# Patient Record
Sex: Female | Born: 1935 | Race: Black or African American | Hispanic: No | Smoking: Never smoker
Health system: Southern US, Community
[De-identification: ages and names within clinical notes are randomized; demographics above are authoritative.]

## PROBLEM LIST (undated history)

## (undated) DIAGNOSIS — M1990 Unspecified osteoarthritis, unspecified site: Secondary | ICD-10-CM

## (undated) DIAGNOSIS — I5022 Chronic systolic (congestive) heart failure: Secondary | ICD-10-CM

## (undated) DIAGNOSIS — I350 Nonrheumatic aortic (valve) stenosis: Secondary | ICD-10-CM

## (undated) DIAGNOSIS — E119 Type 2 diabetes mellitus without complications: Secondary | ICD-10-CM

## (undated) DIAGNOSIS — I482 Chronic atrial fibrillation, unspecified: Secondary | ICD-10-CM

## (undated) DIAGNOSIS — R32 Unspecified urinary incontinence: Secondary | ICD-10-CM

## (undated) DIAGNOSIS — E049 Nontoxic goiter, unspecified: Secondary | ICD-10-CM

## (undated) DIAGNOSIS — I10 Essential (primary) hypertension: Secondary | ICD-10-CM

## (undated) DIAGNOSIS — E785 Hyperlipidemia, unspecified: Secondary | ICD-10-CM

## (undated) HISTORY — PX: ABDOMINAL HYSTERECTOMY: SHX81

## (undated) HISTORY — PX: PACEMAKER INSERTION: SHX728

## (undated) HISTORY — PX: CARDIAC CATHETERIZATION: SHX172

## (undated) HISTORY — DX: Unspecified osteoarthritis, unspecified site: M19.90

## (undated) HISTORY — DX: Nontoxic goiter, unspecified: E04.9

## (undated) HISTORY — DX: Hyperlipidemia, unspecified: E78.5

---

## 2003-03-10 ENCOUNTER — Emergency Department (HOSPITAL_COMMUNITY): Admission: EM | Admit: 2003-03-10 | Discharge: 2003-03-11 | Payer: Self-pay | Admitting: Emergency Medicine

## 2005-06-18 ENCOUNTER — Inpatient Hospital Stay (HOSPITAL_COMMUNITY): Admission: EM | Admit: 2005-06-18 | Discharge: 2005-06-27 | Payer: Self-pay | Admitting: Emergency Medicine

## 2005-06-18 ENCOUNTER — Encounter (INDEPENDENT_AMBULATORY_CARE_PROVIDER_SITE_OTHER): Payer: Self-pay | Admitting: *Deleted

## 2005-07-13 ENCOUNTER — Encounter: Admission: RE | Admit: 2005-07-13 | Discharge: 2005-07-13 | Payer: Self-pay | Admitting: Family Medicine

## 2005-07-18 ENCOUNTER — Inpatient Hospital Stay (HOSPITAL_COMMUNITY): Admission: EM | Admit: 2005-07-18 | Discharge: 2005-08-05 | Payer: Self-pay | Admitting: Emergency Medicine

## 2005-07-18 ENCOUNTER — Ambulatory Visit: Payer: Self-pay | Admitting: Cardiology

## 2005-07-19 ENCOUNTER — Encounter: Payer: Self-pay | Admitting: Cardiology

## 2005-07-20 ENCOUNTER — Encounter (INDEPENDENT_AMBULATORY_CARE_PROVIDER_SITE_OTHER): Payer: Self-pay | Admitting: Specialist

## 2005-08-15 ENCOUNTER — Encounter: Admission: RE | Admit: 2005-08-15 | Discharge: 2005-08-15 | Payer: Self-pay | Admitting: Family Medicine

## 2005-09-05 ENCOUNTER — Encounter: Admission: RE | Admit: 2005-09-05 | Discharge: 2005-09-05 | Payer: Self-pay | Admitting: Family Medicine

## 2005-09-10 ENCOUNTER — Inpatient Hospital Stay (HOSPITAL_COMMUNITY): Admission: EM | Admit: 2005-09-10 | Discharge: 2005-09-13 | Payer: Self-pay | Admitting: Emergency Medicine

## 2005-10-07 ENCOUNTER — Encounter: Admission: RE | Admit: 2005-10-07 | Discharge: 2005-10-07 | Payer: Self-pay | Admitting: Family Medicine

## 2008-12-07 ENCOUNTER — Emergency Department (HOSPITAL_COMMUNITY): Admission: EM | Admit: 2008-12-07 | Discharge: 2008-12-07 | Payer: Self-pay | Admitting: Emergency Medicine

## 2010-10-26 LAB — PROTIME-INR
INR: 2 — ABNORMAL HIGH (ref 0.00–1.49)
Prothrombin Time: 24.2 seconds — ABNORMAL HIGH (ref 11.6–15.2)

## 2010-10-26 LAB — DIFFERENTIAL
Eosinophils Absolute: 0.1 10*3/uL (ref 0.0–0.7)
Eosinophils Relative: 3 % (ref 0–5)
Lymphocytes Relative: 30 % (ref 12–46)
Lymphs Abs: 1.6 10*3/uL (ref 0.7–4.0)
Monocytes Relative: 8 % (ref 3–12)

## 2010-10-26 LAB — CBC
MCHC: 31.9 g/dL (ref 30.0–36.0)
Platelets: 188 10*3/uL (ref 150–400)
WBC: 5.4 10*3/uL (ref 4.0–10.5)

## 2010-12-03 NOTE — Discharge Summary (Signed)
NAMENEAVE, LENGER               ACCOUNT NO.:  [id number]   MEDICAL RECORD NO.:  [id number]          PATIENT TYPE:  INP   LOCATION:  1402                         FACILITY:  [facility]   PHYSICIAN:  Danae Chen, M.D.DATE OF BIRTH:  17-Jul-1927   DATE OF ADMISSION:  07/18/2005  DATE OF DISCHARGE:  08/05/2005                                 DISCHARGE SUMMARY   Please refer to the interim discharge summary per Dr. Arletha Grippe, which covers  from January 1 to July 31, 2005, job #[id number].   ADDENDUM DISCHARGE DIAGNOSES:  1.  Congestive heart failure.  2.  Anemia of chronic disease.  3.  Type 2 diabetes.  4.  Thyrotoxicosis.   DISCHARGE MEDICATIONS:  1.  Cardizem 180 mg p.o. b.i.d.  2.  Nu-Iron tablets 150 mg p.o. daily.  3.  Lasix 40 mg p.o. b.i.d.  4.  Tapazole 20 mg p.o. b.i.d.  5.  Aspirin 81 mg p.o. daily.  6.  Glipizide 2.5 mg p.o. daily.  7.  Enalapril 10 mg p.o. daily.  8.  Crestor 10 mg p.o. daily.  9.  Lopressor 100 mg p.o. b.i.d.  10. K-Dur 20 mg p.o. daily.  11. Coumadin 2.5 mg p.o. daily.  12. Glucophage 1000 mg p.o. b.i.d. with meals.   Patient is to follow up with Dr. Dagoberto Ligas, endocrinologist, for re-evaluation  of her thyroid function in 1-2 weeks' time following discharge.  She is to  follow up with Dr. Parke Simmers for her medical review and following of her INR for  anticoagulation therapy with Coumadin in the next week.   PERTINENT DISCHARGE LABS:  INR 2.9, serum creatinine 0.8.   BRIEF HOSPITAL COURSE:  Since January 15, the patient was admitted with  recurrent pleural effusion.  This was tapped and was found to be exudative;  however, there was no evidence of exudative process, and although it was  __________ effusion, it was most consistent with presentation of CHF and has  responded to diuretics.  In regards to her atrial fibrillation, the patient  was on digoxin, beta blocker, verapamil.  The verapamil and digoxin were  discontinued.  The patient has  continued on Lopressor and Cardizem and has  done well with that.  She did have some issues with heart rate increasing  with activity, but her Cardizem was titrated, and she is tolerating this  well with heart rate in the 80s with the current medication dose.  Her  Tapazole was continued throughout her hospital stay.  As noted, she will  need a repeat thyroid scan as an outpatient in 1-2 weeks' time.  Patient  does have home health nursing that has already been previously ordered, and  this will continue.   PHYSICAL EXAMINATION:  At the time of discharge, the patient was afebrile.  Vital signs were stable.  Pulse was 88.  Blood pressure was 128/65 with O2  sat of 99% on room air.  HEART:  Rate was regular.  LUNGS:  Clear.  ABDOMEN:  Soft.  She had no peripheral edema.  NEUROLOGIC:  She was alert and oriented, eating dinner.  Patient will be returning home, where her family has agreed to provide 24-  hour care.  They have refused the offer of skilled nursing facility  placement for the patient.   Other pertinent labs, a BNP of 789.  This was decreased from the initial BNP  of 1680, so it is trending downward.  Calcium 10.6, potassium 5, sodium 137,  BUN 16, creatinine 1.  Hemoglobin 10.3.   For a list of complete procedures, please refer to Dr. Rickard Rhymes discharge  summary.   CONDITION ON DISCHARGE:  Improved.   CONCLUSION:  Patient is to follow up with Dr. Parke Simmers in one week to have a  PT/INR checked.  Dr. Dagoberto Ligas in 1-2 weeks to have thyroid scan done.   New medications include the Cardizem and the Nu-Iron capsules and the other  medications as listed above.   The patient is to have a heart healthy ADA diet.   ACTIVITY:  As tolerated.      Danae Chen, M.D.  Electronically Signed     RLK/MEDQ  D:  08/05/2005  T:  08/05/2005  Job:  [id number]   cc:   Alfonse Alpers. Dagoberto Ligas, M.D.  Fax: [phone]   Renaye Rakers, M.D.  Fax: [phone]

## 2010-12-03 NOTE — Discharge Summary (Signed)
NAMECECILY, [name]               ACCOUNT NO.:  [id number]   MEDICAL RECORD NO.:  [id number]           PATIENT TYPE:   LOCATION:                                 FACILITY:   PHYSICIAN:  [name], D.O.         DATE OF BIRTH:   DATE OF ADMISSION:  09/10/2005  DATE OF DISCHARGE:  09/13/2005                                 DISCHARGE SUMMARY   PRIMARY CARE PHYSICIAN:  [name], M.D.   ENDOCRINOLOGIST:  [name], M.D.   REASON FOR ADMISSION:  Weakness and near syncope.   PRINCIPAL DIAGNOSES:  1.  Symptomatic bradycardia secondary to beta blocker plus calcium channel      blocker with underlying thyrotoxicosis under treatment by Dr. Margaretmary Edwards.  2.  Congestive heart failure secondary to the above in addition to probable      diastolic heart dysfunction with a normal ejection fraction on most      recent 2-D echocardiogram.  3.  Paroxysmal atrial fibrillation, sinus, during this hospital course.  4.  Known pleural effusion secondary to heart failure in the setting of      thyrotoxicosis and initial refractory hypertension.   ADDITIONAL DIAGNOSES:  1.  Anemia, suspected to be related to chronic disease.  2.  Previous history of shingles, resolved.   MEDICATIONS ON DISCHARGE:  1.  She should stop her verapamil ER.  2.  She should continue her Lopressor 50 mg b.i.d.  3.  Lasix 40 mg daily.  4.  Tapazole 20 mg b.i.d. as before.  5.  She is instructed to stop her HCTZ  6.  Continue her metformin 1 gm as before.  7.  Crestor 10 mg as before.  8.  Aspirin 81 mg as before.  9.  Potassium chloride 20 mEq daily as before.  10. Glipizide 2.5 mg daily as before, without change.   DISPOSITION:  [name] is medically stable for discharge home.  She did  present with an elevated BNP of 1008 and an effusion, though has clinically  responded to withholding her verapamil with her heart rate has been in the  50s to 60s range.  She has been hemodynamically stable and  asymptomatic.  She should followup with Dr. Margaretmary Edwards and call for an appointment  within 1-2 weeks.  I did call to request these laboratory studies to be  faxed; however, I have not yet received these and have recommended to Ms.  Edwards and her daughter to followup with Dr. Margaretmary Edwards on these issues.   LABORATORY DATA:  On discharge the patient's BNP had decreased to 193.  Sodium 137, potassium 3.9, BUN 23, creatinine 1.3, glucose 91.   HISTORY OF PRESENTING ILLNESS:  For full details please refer to the H&P as  dictated by Dr. Lonia Edwards; however, briefly, [name] was a 77-  year-old female with complex medical history who had been doing quite well  since her recent hospital discharge.  She was putting some clothes in her  washing machine without complaints, in the morning;  but throughout the day  she began to feel progressively weak and lightheaded.  She found a chair and  had to sit down and remained symptomatic for about 30 minutes.  This did  slowly resolve.  She did become eventually stronger.  There was no chest  pain or diaphoresis; and no seizure-type activity; and she was subsequently  presented to the emergency department and discovered to be profoundly  bradycardic with a heart rate in the 30s.  Her EKG was unremarkable in  comparison to previous.  She was admitted for further evaluation and  management.   [name] suffered a symptomatic episode of bradycardia secondary to her  medications including verapamil and beta blockers.  She had refractory,  rapid ventricular rate on previous admission.  This is felt to be in the  context of thyrotoxicosis.  She has been on Tapazole for quite some time and  it appears that this is resolving to a degree and she may need to continue  down titration of her rate controlling antihypertensive medications.  In the  hospital we have held her verapamil and she is doing quite well. It may well  be appropriate to start  decreasing her Lopressor if this does continue to  trend downwards.   It may be reasonable to assess her Edwards pressure, once again, this week to  see if she can at least wean it to at least half her dose of Lopressor.  At  this time we are holding her hydrochlorothiazide as well as her verapamil  and requesting that she followup with Dr. Margaretmary Edwards, this week as well  as Dr. Parke Edwards within the next couple of weeks.      [name], D.O.  Electronically Signed     ESS/MEDQ  D:  09/13/2005  T:  09/13/2005  Job:  [id number]   cc:   [name], M.D.  Fax: [phone]   [name], M.D.  Fax: [phone]

## 2010-12-03 NOTE — Discharge Summary (Signed)
[name], [name]               ACCOUNT NO.:  [id number]   MEDICAL RECORD NO.:  [id number]          PATIENT TYPE:  INP   LOCATION:  1402                         FACILITY:  [facility]   PHYSICIAN:  Nelma Rothman, MD   DATE OF BIRTH:  Dec 29, 1926   DATE OF ADMISSION:  07/18/2005  DATE OF DISCHARGE:                                 DISCHARGE SUMMARY   PRIMARY CARE PHYSICIAN:  Dr. Renaye Rakers   CURRENT DIAGNOSES:  1.  Recurrent right pleural effusion, exudative.  2.  Atrial fibrillation.  3.  Hyperthyroidism.  4.  Diabetes mellitus type 2.  5.  Anemia, iron deficiency versus anemia of chronic disease.  6.  Hypercalcemia, with mild increase in alkaline phosphatase, possibly      secondary to increase in bone turnover secondary to hyperthyroidism.   PROCEDURES:  1.  A 2-D echocardiogram on July 19, 2005, demonstrated overall normal      left ventricular systolic function with ejection fraction of 55-65%.      There were no region wall motion abnormalities. There was very mild      aortic valve stenosis with a mean gradient of 9.9 mmHg and estimated      aortic valve area was 1.44 sqcm. There was very mild mitral      regurgitation. The left atrium was mildly to moderately dilated. The      right ventricle was mildly to moderately dilated with mildly increased      systolic pressures and a moderately dilated right atrium.  2.  Two-views of the chest on January 1 demonstrated little interval change      in the past few days in the moderately large right pleural effusion.  3.  Ultrasound-guided thoracentesis on January 3 drained approximately 940      mL of serosanguineous fluid.  4.  Follow-up view of the chest demonstrated slight decrease in the size of      the pleural effusion and no pneumothorax.  5.  Two-views of the chest on January 6 demonstrated a persistent right-      sided effusion.  6.  CT angiogram of the chest on January 8 was negative for pulmonary      embolus. There  was mild dilatation of the descending thoracic aorta and      a thyroid goiter. The right pleural effusion filled approximately half      of the right hemithorax.  7.  Repeat ultrasound-guided thoracentesis on January 8 demonstrated an      additional 1000 mL of fluid.  8.  Most recent portable chest x-ray on January 12 demonstrated mild      cardiomegaly as well as some interstitial edema and reaccumulation of      right-sided effusion.   LABORATORY DATA:  On the morning of dictation, INR was 2.5. Her creatinine  was 0.8 with a potassium of 4.1 and a BUN of 17, calcium was 10.4 with most  recent albumin of 2.4. Beta natriuretic peptide on admission was 1680 and  most recently is 938. Most recent TSH was 0.010. Digoxin level on the  morning of January 14 was 0.8. Pleural fluid analysis demonstrated cloudy  fluid with white blood cell count 700 with 16% neutrophils, 10% lymphocytes,  and 59% monocytes and macrophages. There were 15% mesothelial cells. Pleural  fluid glucose was 141 with protein 3.5 which is likely exudative in nature.  Pleural fluid culture and gram stain are negative. Blood cultures are  negative and urine culture also negative.   HISTORY AND PHYSICAL:  Please see dictated admission history and physical  but briefly, Ms. Bouie is a 77 year old female who presented with shortness  of breath. She was recently diagnosed with thyrotoxicosis and atrial  fibrillation as well as a right-sided effusion, which had recurred. She was  admitted for further evaluation and management.   HOSPITAL COURSE AS ARRANGED BY SYSTEMS:  #1 - RECURRENT RIGHT-SIDED PLEURAL  EFFUSION. Based on analysis of the fluid, this is likely exudative in  nature. The etiology is not entirely clear. The first time she had a  thoracentesis done it was lymphocyte predominant, but this does not appear  to be the case anymore. Last admission, the effusion was drained completely  and a repeat CT scan of the  chest at that time demonstrated complete  reexpansion of that lung, arguing against an obstructing bronchial lesion as  the etiology. Cytology has been negative, reactive per pathology report.  Culture and gram stain are also negative. I thought this may possibly be  related to component of heart failure given her markedly elevated BNP in the  setting of hyperthyroidism and atrial fibrillation. Nevertheless, the right-  sided pleural effusion persists despite diuresis. Although her oxygen levels  on room air are near 100% saturation, she continues to complain of dyspnea  such that a pulmonary consult may be warranted with regard to the right  effusion. Finally, with regard to her pleural effusion, the first time that  it was tapped it was rather bloody in nature and, again, serosanguineous the  second time. Given the exudative effusion and continued dyspnea, I felt  obliged to rule out pulmonary embolus as an etiology. Therefore, she  underwent a CT angiogram of the chest on January 8 which was negative for  pulmonary embolus.   #2 - ATRIAL FIBRILLATION. This was just recently diagnosed during last  hospitalization. It is unclear if this is chronic atrial fibrillation or if  this is related to her hyperthyroidism. Nevertheless, she is anticoagulated  on Coumadin. Her rate control has been somewhat difficult, as I suspect her  tachycardia is being driven by her thyrotoxicosis. She had been taking  verapamil and metoprolol at home, and was actually well rate-controlled on  admission. However, with minimal activity her heart rate would increase to  the 130s and 140s, at which point she would feel dyspneic. Although she has  a normal ejection fraction, with her elevated BNP, digoxin was added to her  regimen. She had a fair amount of bradycardia with a few episodes of pauses  greater than 3 seconds while being loaded with digoxin, so further IV doses were stopped and she only received 250 mcg  IV loading dose. Thereafter, she  was started on 125 mcg daily and has been on this for approximately 2 days.  Her digoxin level this morning was 0.8, although I suspect this is too high  given her age and still with occasional bradycardia on telemetry. I have  discontinued her metoprolol a couple of days ago. At this point, she is well  rate-controlled with a heart rate  in the 70s and I have changed her digoxin  to every-other day dosing. She continues on verapamil. This will need to be  followed closely after discharge, as I suspect that once her hyperthyroidism  is treated adequately her atrial fibrillation will be much easier to  control.   #3 - HYPERTHYROIDISM. She is currently on Tapazole. She was evaluated by Dr.  Dagoberto Ligas on admission. A thyroid scan was ordered, but unfortunately was still  compromised based on recent IV contrast from CT. She is continued on  Tapazole and should follow up with Dr. Dagoberto Ligas 3-4 weeks after admission. As  of time of dictation, her last dose of IV contrast was on January 8.   #4 - DIABETES MELLITUS TYPE 2. This is very well controlled on metformin and  sliding scale insulin.   Please note this is an interim discharge summary and final discharge summary  will be dictated by a physician at that time.      Nelma Rothman, MD  Electronically Signed     RAR/MEDQ  D:  07/31/2005  T:  08/01/2005  Job:  [id number]   cc:   Renaye Rakers, M.D.  Fax: [phone]

## 2010-12-03 NOTE — Discharge Summary (Signed)
[name], [name]               ACCOUNT NO.:  [id number]   MEDICAL RECORD NO.:  [id number]          PATIENT TYPE:  INP   LOCATION:  1417                         FACILITY:  [facility]   PHYSICIAN:  Isidor Holts, M.D.  DATE OF BIRTH:  Jun 22, 1927   DATE OF ADMISSION:  06/17/2005  DATE OF DISCHARGE:  06/27/2005                                 DISCHARGE SUMMARY   PRIMARY CARE PHYSICIAN:  Renaye Rakers, M.D.   DISCHARGE DIAGNOSES:  Refer to Interim Discharge Summary dictated June 22, 2005, by Dr. Threasa Beards.  In addition, add:  Mild congestive heart failure.   PROCEDURES:  Refer to Interim Discharge Summary dictated June 22, 2005,  by Dr. Threasa Beards.  In addition:  1.  Chest x-ray dated June 24, 2005.  This showed slight increase, since      prior plain film of right-sided pleural effusion with developing right      base atelectasis.  There is also cardiomegaly and increased pulmonary      venous congestion, trace left-sided pleural fluid is stable.   For admitting history and detailed clinical course, refer to above-mentioned  Interim Discharge Summary.  However, as of the period of June 23, 2005,  to June 26, 2005, the patient's clinical condition remained relatively  stable. Blood pressure was uncontrolled on June 23, 2005, at 169/85, the  patient continued to experience episodes of tachycardia; i.e., rapid atrial  fibrillation with rates somewhere in the range of 110 to 130.  She also was  found to have mild bilateral lower extremity edema.  It was felt that all  these clinical findings were in the most part, referable to patient's  toxicosis, thus Beta blocker treatment was indicated.  Lopressor was  therefore, instituted in a dose of 25 mg twice daily, which was subsequently  titrated up to 50 mg p.o. twice daily which the patient tolerated well with  rate control of atrial fibrillation and markedly improved blood pressure  control.  As the patient was  felt to be in mild congestive heart failure  secondary to above, Lasix dose was increased to 40 mg p.o. daily with good  clinical response.  However, the patient developed mild hypokalemia  necessitating addition of  potassium supplements.   Diabetes mellitus remained controlled.  Oral hypoglycemics have been  reinstitiuted, although metformin has been discontinued indefinitely, given  patient's age and also presence of mild CHF because of increased propensity  to lactic acidosis.  She is currently on glipizide 2.5 mg p.o. daily and has  remained glycemic on this.  Carbohydrate-modified diet was, of course,  recommended.   The patient's hemoglobin level has remained stable.  As a matter of fact, on  June 26, 2005, hemoglobin was 10.2 with hematocrit of 30.8.  As patient  remains in atrial fibrillation, anticoagulation is continued, and we expect  this to be followed up on outpatient basis by patient's primary care  physician, Dr. Renaye Rakers, who will check her INRs periodically and adjust  Coumadin dosage as indicated.  The patient also will need endocrinology  consultation, which we expect to  be arranged by her primary care physician,  with a view to definitive management of thyrotoxicosis. It is possible that  she may opt for radioiodine therapy.  However, we defer this to  endocrinologist.   Note that patient was recently bereaved; i.e., lost her son-in-law; however,  her mood appears quite stable.  The news was broken to her by her family,  and she accepted it calmly. The patient obviously has close family support.   DISPOSITION:  The patient was considered clinically stable for discharge on  June 27, 2005.   DIET:  Carbohydrate modified.   ACTIVITY:  As tolerated.   WOUND CARE:  Not applicable.   PAIN MANAGEMENT:  Not applicable.   FOLLOW UP:  The patient is to follow up with her primary care physician, Dr.  Renaye Rakers, on June 30, 2005, to check her INR and  have her Coumadin  dosage adjusted as indicated.  Telephone number is [phone].  She has also  been instructed to follow up routinely in one week.   SPECIAL INSTRUCTIONS:  The patient's primary physician is expected to check  patient's blood count next week, as patient is on Tapazole.  The patient has  been instructed that if she should develop a sore throat or develop cold or  flu-like symptoms, she should call her primary care physician urgently, in  order to have her blood count checked.  All this has been communicated to  patient and her family, and they have verbalized understanding.   DISCHARGE MEDICATIONS UPDATED:  1.  Methimazole (Tapazole) 20 mg p.o. twice daily.  2.  Verapamil ER 240 mg p.o. q.a.m. and 120 mg p.o. nightly.  3.  Aspirin 81 mg p.o. daily (over-the-counter)  4.  Glipizide 2.5 mg p.o. daily.  5.  Enalapril 10 mg p.o. daily.  (Was on Enalapril/hydrochlorothiazide.)  6.  Crestor 10 mg p.o. daily.  7.  Lopressor 50 mg p.o. twice daily.  8.  K-Dur 20 mEq p.o. daily.  9.  Lasix 40 mg p.o. daily.  10. Metformin.  This has been stopped.  11. Coumadin according to INR.  The patient is currently on 5 mg p.o. q.p.m.      Further adjustments will be done by primary care physician as indicated.      Isidor Holts, M.D.  Electronically Signed     CO/MEDQ  D:  06/26/2005  T:  06/26/2005  Job:  [id number]   cc:   Renaye Rakers, M.D.  Fax: [phone]   Althia Forts, M.D.  [place], [place]

## 2010-12-03 NOTE — Consult Note (Signed)
NAMEADALIAH, [name]               ACCOUNT NO.:  [id number]   MEDICAL RECORD NO.:  [id number]          PATIENT TYPE:  INP   LOCATION:  1402                         FACILITY:  [facility]   PHYSICIAN:  [name], M.D.DATE OF BIRTH:  1927/01/03   DATE OF CONSULTATION:  DATE OF DISCHARGE:                                   CONSULTATION   HISTORY:  This is a 77 year old woman who was previously in the hospital 2  weeks ago, and at that time she had a diagnosis of hyperthyroidism.  She had  a TSH level of 8 and a free T4 that was elevated at 3.32.  She was treated  with Tapazole therapy and was discharged from the hospital.  At that time  she had symptoms of congestive heart failure and had pleural effusions.  She  was receiving 20 mg of Tapazole b.i.d.  She was also in atrial fibrillation  and was on Coumadin and was discharged from the hospital, resuming her  Tapazole.  She presents now with shortness of breath and is also in atrial  fibrillation and heart failure at this time.   She has a history of a significant weight loss.  She states that she has had  a history of 50-60-pound weight loss during the summer.  She is nonspecific  in her symptoms.   PAST MEDICAL HISTORY:  She was hospitalized in December as noted above.  She  was also hospitalized in the past for a hysterectomy, but it sounds like she  has been in reasonably good health.   She has a history of diabetes mellitus.   MEDICATIONS:  Prior to this admission include:  1.  Coumadin 5 mg daily.  2.  Tapazole 20 mg b.i.d.  3.  Verapamil 240 in the morning and 120 in the evening.  4.  Enteric-coated aspirin 81 mg.  5.  Glipizide 2.5 mg.  6.  Enalapril 10 mg daily.  7.  __________ 10 mg daily.  8.  Lopressor 50 mg b.i.d.  9.  K-Dur 20 mEq daily.  10. Lasix 40 mg daily.   PERSONAL HISTORY:  She does not smoke or drink excessive amounts of alcohol.  She denies any history of allergies.   REVIEW OF SYSTEMS:  Her weight  is decreased as noted above.  She denies any  history of chest pain.  GI:  No complaints.   PHYSICAL EXAMINATION:  GENERAL:  This is a well-developed woman who appears  clinically stable, in no distress.  HEENT:  Her head is normocephalic.  Her eyes show moderate exophthalmos to  be present.  There is no paralysis of gaze.  There is no lid lag.  No  periorbital edema.  NECK:  Supple.  The thyroid is diffusely enlarged, measuring two to three  times normal.  It is a finely nodular texture and firm in texture.  No  dominant nodules are palpable.  LUNGS:  Clear, but tubular breath sounds are present on the left base.  CARDIOVASCULAR:  Rhythm is irregular.  EXTREMITIES:  Show trace pedal edema.  No evidence of pretibial myxedema.  Her hands show no evidence of acropachy.   IMPRESSION:  Hyperthyroidism.   DISCUSSION:  At this time a radioactive iodine uptake and scan is requested.  She would do best with definitive therapy, and radioactive iodine therapy  will be the treatment of choice.  The patient was informed of this, and also  I informed her that this would require her more than likely to receive life-  long thyroid replacement, to which she appears to agree.  The results of the  radioactive iodine uptake will be present in a couple of days, and at that  time therapy will be arranged.  The patient could be discharged subsequent  to that according to her heart failure status.  We will need to keep her off  the Tapazole for a period of time while we do the radioactive iodine  therapy.           ______________________________  Alfonse Alpers. Dagoberto Ligas, M.D.     CGG/MEDQ  D:  07/19/2005  T:  07/19/2005  Job:  [id number]

## 2010-12-03 NOTE — H&P (Signed)
NAMEMEKLIT, COTTA               ACCOUNT NO.:  [id number]   MEDICAL RECORD NO.:  [id number]          PATIENT TYPE:  INP   LOCATION:  1833                         FACILITY:  [facility]   PHYSICIAN:  Lonia Blood, M.D.DATE OF BIRTH:  [birth year]   DATE OF ADMISSION:  09/10/2005  DATE OF DISCHARGE:                                HISTORY & PHYSICAL   PRIMARY CARE PHYSICIAN:  Dr. Parke Simmers.   ENDOCRINOLOGIST:  Dr. Dagoberto Ligas.   CHIEF COMPLAINT:  Weakness with near syncope.   HISTORY OF PRESENT ILLNESS:  [name] is a 77 year old female with a  complex medical history noted below. She was discharged from the hospital  just in mid-January of 2007 after a long stay for acute CHF exacerbation  with a chronic right pleural effusion. She reports that she had been doing  quite well living with her daughter since that time. Today, she was up  putting some clothes in the washing machine. She had no complaints this  morning. As she was up, she began to feel extremely weak. She began to feel  as if she was going to pass out. She found the chair and sat down. Her  symptoms continued for approximately 30 minutes. They then slowly resolved  and she began to feel stronger. There was no chest pain. There was no  diaphoresis. There was no headache. There was no visual change. There was no  focal neurologic deficit or focal weakness. There was no loss of  consciousness. There was no seizure-type activity. There was no loss of  bowel or bladder control. Because of the patient's complex history and the  fact that this was an initial episode for this patient, family decided to  present with her to the emergency room for evaluation.   In the emergency room, the patient was placed on telemetry. Heart rate was  as low as 36 initially and was sustained there. Without any significant  intervention, the patient's heart rate was observe to spontaneously increase  back to 54 to 64. EKG reveals bradycardia but no  evidence of acute ST or T-  wave elevation or change. The patient does have an irregular rhythm  consistent with her known atrial fibrillation.   REVIEW OF SYSTEMS:  As mentioned above, the patient states that she has been  doing quite well since her discharge from the hospital and has not had any  significant complaints until those noted in the history of present illness  above.   PAST MEDICAL HISTORY:  1.  Reported history of CHF with an acute exacerbation and a persistent      right effusion requiring hospitalization January 2007.      1.  CHF was felt to be rate-related in the setting of atrial          fibrillation and hypothyroidism.      2.  Echocardiogram in January of 2007 actually revealed normal LV          systolic function. EF 55-65%. No wall motion abnormalities, normal          left ventricular thickness, mild aortic valve  stenosis in the left          atrium which was moderately dilated.  2.  Persistent right pleural effusion with negative cytology of unclear      etiology.  3.  Anemia, question of chronic disease.  4.  Diabetes mellitus type 2.  5.  Thyroid toxicosis/hypothyroidism with multiple nodular goiter and      Tapazole now for approximately one month.  6.  Paroxysm atrial fibrillation with Coumadin discontinued approximately      one week ago by the patient's primary care physician.  7.  Bradycardia with digoxin therapy during hospitalization January 2007.  8.  Status post hysterectomy.  9.  Recent history of shingles.  10. Hypercholesterolemia.   OUTPATIENT MEDICATIONS:  1.  Lasix 40 milligrams p.o. daily.  2.  Verapamil ER 240 milligrams p.o. daily.  3.  Tapazole 20 milligrams p.o. b.i.d.  4.  Enalapril 10 milligrams p.o. daily.  5.  Lopressor 50 milligrams p.o. b.i.d.  6.  HCTZ 25 milligrams p.o. daily.  7.  Metformin 1000 milligrams p.o. q.a.m.  8.  Crestor 10 milligrams p.o. q.a.m.  9.  Aspirin 81 milligrams daily.  10. Potassium chloride 20 mEq  p.o. daily.  11. Glipizide 2.5 milligrams p.o. daily.   ALLERGIES:  No known drug allergies.   FAMILY HISTORY:  Noncontributory secondary to age.   SOCIAL HISTORY:  The patient lives with her daughter in [place]. She is a  widow. She has six children total. She does not smoke. She does not drink  alcohol. She previously used snuff but quit about one year ago.   LABORATORY DATA:  Urinalysis is negative. INR is 1.3. BNP is 1008. A pH of  7.37, pCO2 of 37, pO2 is not available on the venous gas. Chest x-ray  reveals cardiomegaly, vascular congestion and stable right effusion. Point  of care markers are negative x1. A 12-lead EKG reveals 54 beats per minute  with irregular rhythm but no evidence of acute ST or T-wave change. Sodium  is low at 134, potassium is high at 5.5, bicarbonate is normal. BUN is  elevated 38, creatinine is mildly elevated at 1.5. Serum glucose is 98.   PHYSICAL EXAMINATION:  VITAL SIGNS: Temperature 97.9, blood pressure 133/56,  heart rate 36, respiratory rate 24, O2 saturation 98% on room air.  GENERAL: Thin, frail-appearing female who is not in acute respiratory  distress.  HEENT: Normocephalic and atraumatic. Pupils are equal, round, and reactive  to light and accommodation. Extraocular movements intact bilaterally. OC/OP  clear.  NECK: No JVD. No lymphadenopathy.  LUNGS: Clear to auscultation bilaterally without wheezes or rhonchi.  CARDIOVASCULAR: Bradycardic and irregular with a 2/6 holosystolic murmur  that does radiate up into the neck.  ABDOMEN: Thin, nontender, nondistended, soft, bowel sounds present. No  hepatosplenomegaly, no rebound and no ascites.  EXTREMITIES: No significant clubbing, cyanosis, or edema bilateral lower  extremities.  NEUROLOGICAL: The patient is alert, though somewhat lethargic. Cranial  nerves II through XII are intact bilaterally. There is 5/5 strength in the bilateral upper and lower extremities. He has intact sensation to  touch  throughout. There is no Babinski.   IMPRESSION/PLAN:  1.  Severe symptomatic bradycardia with near syncopal spell: The patient may      in fact be developing a tachybrady syndrome accompanying her atrial      fibrillation. It is noted that she had a significant bradycardic event      with digoxin during her recent hospitalization. The patient  is on two      negative chronotropic agents to include verapamil and Lopressor. I will      hold both these medications at this time. It is also quite possible that      the patient's Tapazole dose is sufficiently high enough to cause severe      thyroid suppression and the bradycardia could be related to this. I will      check a thyroid-stimulating hormones though it may be too early for it      to accurately reflect the action of the Tapazole which was initiated      approximately one month ago. We will follow the patient on telemetry. We      will rule her out for silent myocardial infarction as she would be at      high risk for this with hypercholesterolemia, diabetes, hypertension and      advanced age. If the patient's bradycardia does not spontaneously      resolve with discontinuation of verapamil and Lopressor or if the      patient has reflex tachycardia which is not able to be managed without      bradycardia, we will consult cardiology to consider pacemaker placement      so that negative chronotropic agents may be used to treat the patient's      paroxysm atrial fibrillation.  2.  Diabetes mellitus: We will hold the patient's p.o. medications as I am      not sure that her p.o. intake will continue at her home level for now.      We will use sliding scale insulin and follow her complete blood glucose      closely.  3.  Hypothyroidism on Tapazole: We will check a thyroid-stimulating      hormones. I will hold the patient's Tapazole dose for now. We will      consider resuming Tapazole based upon the results of the thyroid-       stimulating hormones.  4.  Recent hypercalcemia: The patient has a recent history of hypercalcemia.      We will follow up with calcium in the morning and dive into further      workup as appropriate.  5.  Mild hyponatremia: This is most likely secondary to a combination of      Lasix and HCTZ therapy. I will hold both of these medications and very      gently hydrate the patient. It should be noted that she does not have a      history of left ventricular systolic dysfunction and her previous      congestive heart failure was felt to be rate related with uncontrolled      paroxysm atrial fibrillation.  6.  Mild renal insufficiency: I feel that this is most likely secondary to      dual diuretics in an elderly patient. We will hold off on both the Lasix      and the HCTZ for now and follow her creatinine closely.  7.  Elevated B-type natriuretic peptide: The validity of this is     questionable in a patient with a creatinine of 1.5. A creatinine of 1.5      is only borderline high for this frail, thin 77 year old female. This      represents a significant decrease in glomerular filtration rate. For      now, we will hod diuretics as discussed above. I do not feel that  rechecking B-type natriuretic peptide would add anything to the      patient's clinical treatment plan.      Lonia Blood, M.D.  Electronically Signed     JTM/MEDQ  D:  09/10/2005  T:  09/10/2005  Job:  [id number]   cc:   Renaye Rakers, M.D.  Fax: [phone]   Alfonse Alpers. Dagoberto Ligas, M.D.  Fax: [phone]

## 2010-12-03 NOTE — H&P (Signed)
NAMEMILANA, Edwards               ACCOUNT NO.:  [id number]   MEDICAL RECORD NO.:  [id number]          PATIENT TYPE:  INP   LOCATION:  0103                         FACILITY:  [facility]   PHYSICIAN:  Lonia Blood, M.D.DATE OF BIRTH:  [birth year]   DATE OF ADMISSION:  06/17/2005  DATE OF DISCHARGE:                                HISTORY & PHYSICAL   CHIEF COMPLAINT:  Weakness.   HISTORY OF PRESENT ILLNESS:  Ms. [name] is a 77 year old female who  is visiting family.  She lives in [place], [place], but has multiple  family members who live in the [place] area.  Ms. [name] has had multiple  constitutional symptoms dating back many months.  Over the last year, family  reports that she has lost a total of 50 pounds.  In the last 6 months, the  patient has developed a chronic, dry, nonproductive, hacking cough.  She has  also become short of breath on exertion and sometimes when lying flat.  These are new symptoms for her.  Additionally, she developed a case of  shingles on the left face in July.  She has been feeling weak in general.  She reports her appetite is diminishing.  Overall, the family feels that the  patient is failing to thrive in a significant enough way that they are  concerned that something is going on.  Although, no acute change was  encountered tonight the patient had announced to the family that she was  returning to [place] in the morning and the family therefore insisted  that she be evaluated by medical personnel prior to her leaving.  As a  result, the family presented with the patient to [facility] Emergency Room.   REVIEW OF SYSTEMS:  Negative with the exception of the positive elements  noted in history of present illness above.   PAST MEDICAL HISTORY:  1.  Hypercholesterolemia.  2.  Hypertension.  3.  Paroxysmal atrial fibrillation on Verapamil and aspirin.  4.  Diabetes mellitus, discontinued medication 1 week ago.  5.  Status  post hysterectomy.   MEDICATIONS:  1.  Verapamil ER 240 mg p.o. daily.  2.  Enalapril 10/HCTZ 25 mg p.o. daily.  3.  Metformin 1000 mg q.a.m., discontinued approximately 1 week ago.  4.  Crestor 10 mg p.o. q.a.m.  5.  Enteric coated aspirin 81 mg p.o. daily.   ALLERGIES:  No known drug allergies.   FAMILY HISTORY:  Noncontributory secondary to age.   SOCIAL HISTORY:  The patient does not smoke.  She does not drink.  She lives  in [place] in the [place] area.  She is retired and lives alone in  [place], but has multiple family members in the [place] area.   LABORATORY DATA AND X-RAY FINDINGS:  Hemoglobin is low at 8.9 with a normal  MCV of 85, white count and platelet count normal.  Sodium low at 130,  potassium 3.4 which is low, chloride and bicarb are normal, BUN 12,  creatinine 0.7, serum glucose 133, calcium 10.1.  LFTs normal.  Albumin low  at 3.0.  CT scan of the chest reveals enlarged right supraclavicular lymph node with  enlarged thyroid gland with multiple nodules and a very large right-sided  effusion.   PHYSICAL EXAMINATION:  VITAL SIGNS:  Temperature 98.8, blood pressure  146/73, heart rate 94, respirations 18, O2 saturations 96% on room air.  CBG  156-174 in the ER.  GENERAL:  Cachectic-appearing, frail female who appears older than her  stated age, but is in no acute respiratory distress.  HEENT:  Normocephalic, atraumatic.  Pupils equal round and reactive to light  and accommodation, but sluggish.  OC/OP clear.  NECK:  Thin, no JVD, no appreciable lymphadenopathy.  I am unable to palpate  any thyroid nodules, but thyroid exam is difficult due to difficulty with  swallowing during palpation.  LUNGS:  Clear to auscultation throughout with good air movement throughout  all fields with exception to the right base where lung sounds are completely  absent.  CARDIAC:  Tachycardic at approximately 105 beats per minute, irregularly  irregular without  appreciable murmur, gallop or rub.  ABDOMEN:  Thin, mildly tender to deep palpation throughout.  Bowel sounds  positive.  No mass, no hepatosplenomegaly, no rebound, no ascites.  EXTREMITIES:  Thin, wasted appearing, no clubbing, cyanosis or edema of  bilateral lower extremities.  NEUROLOGIC:  She has 5/5 strength in bilateral upper and lower extremities.  Intact sensation to touch throughout.  No Babinski.  Cranial nerves 2-12  grossly intact bilaterally.  Alert and oriented x4.   ASSESSMENT/PLAN:  1.  Combination of weight loss, generalized weakness, chronic cough, large      right pleural effusion and thyroid nodules is all very concerning.  It      is not clear at this time if the thyroid nodules represent a primary      cancer that has spread to the lung or if they are benign and the primary      concern should be that of lung cancer.  The patient is a nonsmoker.  She      has no occupational exposure that is clear at this time to carcinogens.      Each issue will be addressed as detailed separately below.  I have      informed the patient, however, that all of her symptoms combined concern      me that she could have cancer and my primary reason for admitting her is      to evaluate that possibility.  2.  Large right pleural effusion.  My concern at this time is for a possible      obstructing, right lower lobe bronchogenic cancer.  The patient will be      admitted to the hospital.  PT/PTT will be obtained.  If these are in      fact normal, the patient will then be scheduled for an ultrasound-guided      thoracentesis in radiology.  This will be sent for the usual studies to      help differentiate exudate from transudate and also will be sent for      cytology.  If we are able to accomplish complete drainage of the right      thoracic cavity, computed tomography scan of the chest will be reordered     to attempt to localize any potential pulmonary lesions in the right      lower  lobe or right middle lobes.  3.  Thyroid nodules.  Computed tomography scan of the upper chest revealed  an enlarged thyroid with multiple nodules.  This is concerning for the      possibility of a primary thyroid carcinoma.  Thyroid ultrasound will be      obtained.  Thyroid stimulating hormone will also be obtained and if      necessary, FT3 and FT4 will also be obtained.  4.  Mild hyponatremia.  The patient is on hydrochlorothiazide which could be      causing this, but his may also be further evidence of a possible      pulmonary carcinoma.  I will hold the patient's hydrochlorothiazide and      we will hydrate her with normal saline.  A BMET will be followed in      approximately 24 hours.  5.  Diabetes mellitus.  The patient reports that she quit taking her      Glucophage approximately 1 week after attending a diabetes fair where      she was told that she did not have diabetes anymore.  I have informed      the patient that she does still have diabetes, but I have agreed to      attempt diet-control during this hospital stay before resuming her      Glucophage.  This would be a poor choice to resume at this time anyway      as the patient is likely to have multiple contrasted studies during this      hospital stay.  Sliding scale insulin will be administered and we will      follow CBG closely.  6.  Paroxysmal atrial fibrillation.  I have questioned the patient and she      is not able to provide any history as to why she is not on Coumadin for      anticoagulation.  She states that Verapamil and aspirin is all that she      has ever taken for her atrial fibrillation.  Will not initiate Coumadin      at this time as it would be a poor time as the patient is certainly      going to undergo a thoracentesis if not more aggressive interventional      therapies.  Lovenox will be initiated for deep venous thrombosis      prophylaxis and we will continue aspirin as previously dosed  for stroke      prophylaxis in this patient with atrial fibrillation.  Verapamil will be      continued for heart rate control and we will place the patient on      telemetry.  7.  Hypercholesterolemia.  The patient's Crestor will be continued during      her hospital stay.  8.  Hypertension.  The patient's blood pressure will be controlled with the      use of Verapamil and Enalapril.  We may need to consider holding the ACE      inhibitor should the patient need further contrasted computed tomography      scans to include repeat computed tomography scan of the chest as well as      computed tomography scan of the abdomen, pelvis and head.  This decision      will be made based up on the results of the thoracentesis.      Lonia Blood, M.D.  Electronically Signed     JTM/MEDQ  D:  06/18/2005  T:  06/18/2005  Job:  [id number]

## 2010-12-03 NOTE — Discharge Summary (Signed)
[name], [name]               ACCOUNT NO.:  [id number]   MEDICAL RECORD NO.:  [id number]          PATIENT TYPE:  INP   LOCATION:  1417                         FACILITY:  [facility]   PHYSICIAN:  Laura Rothman, MD   DATE OF BIRTH:  06-May-1927   DATE OF ADMISSION:  06/17/2005  DATE OF DISCHARGE:  06/22/2005                                 DISCHARGE SUMMARY   Her primary care physician locally will be [name], M.D.   Her primary care physician in [place] is Dr. Althia Edwards in  [place], [place].   DISCHARGE DIAGNOSES:  1.  Thyrotoxicosis, likely secondary to multinodular goiter.  2.  Bilateral pleural effusions, right greater than left, likely related to      #1.  3.  Chronic atrial fibrillation.  4.  Anemia, iron deficiency anemia versus anemia of chronic disease.  5.  Hypercalcemia with mild increase in alkaline phosphatase.  6.  Hypertension.  7.  Diabetes mellitus type 2 with excellent control on low dose metformin.   PROCEDURES:  1.  Two views of the chest on December 1 demonstrated right lung volume      loss, cannot exclude centrally obstructing lesion.  2.  CT of the chest with contrast on December 2 demonstrated a large right      pleural effusion as well as a large right thyroid nodule.  There was      associated atelectasis and consolidation of the right lung.  3.  Ultrasound-guided thoracentesis on December 2nd.  Was able to remove      approximately 1 liter of blood-tinged fluid before patient started      coughing, and procedure had to be aborted.  Fluid was sent for      evaluation.  4.  Thyroid ultrasound on December 2nd demonstrated a moderate-sized goiter      with 1.8 cm solid nodule in the mid right lobe and a 2.1 cm solid nodule      in the inferior left lobe.  5.  Chest x-ray on December 4th, status post first thoracentesis,      demonstrated little change in a moderate-sized right effusion and a      small left effusion.  6.   Repeat ultrasound-guided thoracentesis on December 5th yielded an      additional 1.3 liters of serosanguineous fluid, felt to have drained as      much of the effusion as easily possible.  7.  Chest x-ray, status post thoracentesis, demonstrated no pneumothorax.  8.  CT of the chest with contrast on December 5th after repeat thoracentesis      demonstrated some small residual bilateral effusions, right slightly      greater than left, with mild atelectasis or edema in the right lower      lobe, likely related to reexpansion but no underlying lesions.  There      was no pneumothorax and no pulmonary embolus.  Thyroid nodule and      prominence again noted.   LABORATORY DATA:  Hemoglobin on admission was 8.9.  Hemoglobin the morning  of  discharge, prior to transfusion, was 8.8.  White blood cell count 6.4  with normal differential and platelet count 241.  INR is 1.2.  PTT 44.  Chemistries demonstrated a creatinine of 0.7, calcium 10.3 with albumin of  3.  Hemoglobin A1C 5.3.  A BNP was 1170.  Fasting lipid profile demonstrated  a total cholesterol of 86, HDL 34, LDL 42, and triglycerides 49.  A TSH was  0.006 with a free T4 of 3.32 and a free T3 of 9.5.  Iron studies  demonstrated a total iron 40, total iron-binding capacity 274, percent  saturation was 15.  Ferritin was 44.  Vitamin B12 level was 820.  Serum  folate was greater than 20.  Pleural fluid analysis demonstrated total  protein of 3.4 with an LDH of 87.  The pH was 7.49.  Cholesterol is 41.  There were 2% mesothelial cells with a white blood cell count of 1000 per  cubic millimeter with 60% lymphocytes and 34% monocytes/macrophages.   HISTORY OF PRESENT ILLNESS:  [name] is a very pleasant 77 year old lady  who is actually visiting her family from the holidays from [place];  however, near the end of her visit, her family became concerned because she  has been consistently so weak and tired over the past few weeks to  months.  They have also noted some weight loss of approximately 50 pounds over the  past year or so.  Given these concerns, they felt that she should be  medically evaluated prior to her return to [place].  The remainder of  hospital course will be detailed by systems.   HOSPITAL COURSE:  Problem 1:  Right pleural effusion:  Chest x-ray on  admission demonstrated a large right-sided pleural effusion.  Chest CT  confirms this result with associated right lower lobe atelectasis and edema.  Etiology was not clear, so patient underwent an ultrasound-guided right  thoracentesis, yielding some serosanguineous  fluid.  Further analysis  demonstrated chemistries on the borderline of transudate/exudative effusion.  Specifically, her pleural fluid LDH was 87 with a serum LDH of 128, which is  almost exactly 0.6 ratio.  Her pleural fluid protein was 3.4 with a serum  protein of 6.3.  Again, just greater than 50% of the serum protein.  Her pH  was 7.49.  Cell count demonstrated predominance of lymphocytes with 2%  mesothelial cells.  Pleural fluid cytology did return with rare atypical  cells, but reactive mesothelial cells were favored.  The first attempt at  thoracentesis had to be aborted after approximately one liter was removed,  as the patient developed cough.  Two days later, repeat thoracentesis  yielded an additional 1.5 liters of fluid and was thought to have drained  almost completely.  A repeat chest CT at that time demonstrated reexpansion  of the right lung with only minimal atelectasis or edema, likely related to  reexpansion.  There were no underlying lesions.  There was also a small left-  sided pleural effusion.  Given these findings, it was felt that her pleural  effusions are likely most consistent with an element of heart failure  secondary to thyrotoxicosis and rapid atrial fibrillation.  In keeping with this, her BNP was elevated at 1100.  Indeed, the pleural effusions  were  bilateral, although right greater than left.  Should they reaccumulate once  heart rate and thyrotoxicosis are resolved, she may need a repeat  thoracentesis for further evaluation.  Problem 2:  Thyrotoxicosis:  This  likely accounts for the majority of her  presenting symptoms, namely fatigue, weight loss, as well as atrial  fibrillation with rapid ventricular response.  CT did demonstrate the  presence of some thyroid nodules, so she underwent an ultrasound of the  thyroid for further evaluation.  This demonstrated a moderate-sized goiter  with two approximately 2 cm nodules, solid, one on the left and one on the  right.  After discussion with the endocrinologist on call, this was felt to  be most likely consistent with a multinodular goiter.  Unfortunately, given  that she had just received IV contrast for this CT, she is unable to undergo  a radio iodine uptake scan for approximately two weeks.  In the meantime, we  have elected to initiate methimazole 20 mg b.i.d. to try to gain control of  her thyrotoxicosis and associated symptoms.  She will obviously need  followup closely.  After approximately two weeks, she can undergo an uptake  scan for further evaluation of these nodules.  She will also need a CBC  checked after initiation of methimazole.  Baseline liver function tests were  within normal limits.  She will need a repeat TSH checked as well, and her  methimazole dose adjusted accordingly.  This may be best accomplished by  outpatient referral to an endocrinologist, as deemed appropriate by her  primary care physician.  Problem 3:  Anemia:  Patient was noted to have a borderline microcytic  anemia on admission.  This remained completely stable throughout her  hospital course.  This appears to have been a longstanding issue.  She has  never had a colonoscopy but does state that she has had fecal occult blood  testing done.  Iron studies demonstrated a ferritin of 44.  Given  her age,  this could be consistent with iron-deficiency anemia versus an anemia of  chronic disease.  Although this has been completely stable during the  hospitalization, I do think a unit of blood would help with her dyspnea on  exertion, and as such, we will transfuse 1 unit of blood prior to discharge.  She will follow up with her primary care physician, at which time, further  colon cancer screening should be addressed.  Problem 4:  Hypertension:  Well controlled on her home regimen of enalapril  and hydrochlorothiazide.  I have also increased her verapamil, more for  better rate control of atrial fibrillation rather than hypertension, per  say.  Problem 5:  Atrial fibrillation with rapid ventricular response:  Her rate  is generally well controlled while at rest, but does increase into the 130s  with exertion.  I have therefore increased her verapamil ER to 240 mg in the morning and 120 mg in the evening.  She seemed to tolerate this well, and  her heart rate has generally been in the 70s to 90s while here in the  hospital.  Patient states that she has known about this irregular heartbeat  for not very long, per her report.  It may indeed be that her atrial  fibrillation is related to the hyperthyroid, and once euthyroid, she may  spontaneously convert to normal sinus rhythm.  Nevertheless, given her age  and comorbidities, I would favor initiating Coumadin therapy.  I discussed  this with the patient and have ordered Coumadin education as well as dietary  counseling.  She will obviously need close followup of her INR, as can be  followed by her primary care physician.  If she should convert  to normal  sinus rhythm once euthyroid, could consider discontinuation of Coumadin  therapy, as in that case her atrial fibrillation may have been solely  related to thyrotoxicosis.  Problem 6:  Diabetes mellitus:  Her hemoglobin A1C was 5.3, which  demonstrates excellent control.  This is a  relatively recent diagnosis and  is probably indeed true, as based on random blood sugars greater than 200  while here in the hospital; however, her morning sugars are generally well  controlled.  Her metformin has been held secondary to IV contrast and will  continue to be held for another two days following discharge, given recent  CT scan, at which time, it can be resumed.   DISCHARGE MEDICATIONS:  1.  Methimazole 20 mg p.o. b.i.d.  2.  Coumadin 3 mg p.o. daily or as directed by your doctor.  3.  Verapamil ER 240 mg p.o. q.a.m. and 120 mg p.o. q.p.m.  4.  Aspirin 81 mg p.o. daily.  5.  Metformin 500 mg p.o. twice daily.  Hold for next 2 days and restart on      Saturday, June 25, 2005.  6.  Enalapril/hydrochlorothiazide 10/25 mg 1 tablet p.o. daily.  7.  Crestor 10 mg p.o. daily.   DISCHARGE INSTRUCTIONS:  Patient will follow up here locally with Dr. Renaye Edwards and will call tomorrow morning to make an appointment, phone number  [phone].  Given that she will need to have her INR checked, I have asked  her to make an appointment either for this Friday, December 8th, or Monday,  December 11th.  She was instructed that she will need to have this lab work  done, specifically to monitor her INR.  I have also instructed her that  should she experience a sore throat or cold or flu symptoms, she should  check with her doctor to have her blood counts checked, given that a  granulocytosis, although very rare, is a known side effect of methimazole.           ______________________________  Laura Rothman, MD     RAR/MEDQ  D:  06/22/2005  T:  06/22/2005  Job:  [id number]   cc:   [name], M.D.  Fax: [phone]

## 2010-12-03 NOTE — H&P (Signed)
NAMESUTTON, PLAKE               ACCOUNT NO.:  [id number]   MEDICAL RECORD NO.:  [id number]          PATIENT TYPE:  INP   LOCATION:  0101                         [facility]ACILITY[facility]  [facility]   PHYSICIAN:  Elliot Cousin, M.D.    DATE OF BIRTH:  05/21/1927   DATE OF ADMISSION:  07/18/2005  DATE OF DISCHARGE:                                HISTORY & PHYSICAL   PRIMARY CARE PHYSICIAN:  Renaye Rakers, M.D.   CHIEF COMPLAINT:  Shortness of breath.   HISTORY OF PRESENT ILLNESS:  The patient is a 77 year old lady who was  recently hospitalized 2 weeks ago for newly diagnosed thyrotoxicosis.  The  patient presented to the hospital on June 17, 2005, with a chief  complaint of generalized weakness.  During the initial workup, she was noted  to have a large right pleural effusion.  At that time, a CT scan of the  chest was ordered for further evaluation.  The CT scan revealed enlarged  thyroid nodules.  Subsequently, thyroid lab tests were ordered and revealed  a TSH of 0.008 and a free T4 of 3.32.  The patient was subsequently started  on Tapazole.  She underwent thoracentesis x2, removing more than 2 liters of  what appeared to be a transudative pleural fluid.  The patient was also  started on treatment with Lopressor to control her heart rate.  She does  have a history of paroxysmal atrial fibrillation.  Prior to her  hospitalization in December, the atrial fibrillation was treated with  verapamil and aspirin.  However, she was discharged to home on Coumadin  therapy.   Over the past couple of weeks following hospital discharge on June 27, 2005, the patient has become more short of breath.  She is more dyspneic  with ambulation and when she lies flat.  In fact, the patient is unable to  sleep lying flat secondary to shortness of breath.  She says that the  swelling in her legs overall has improved. She has also had a cough  productive of white, thick, and sometimes yellow-colored sputum. She  denies  outright subjective fever and chills; however, she says at time she feels  hot, and at other times she feels cold.  She denies any earache, sore  throat, or runny nose.  No diarrhea.  No nausea or vomiting.  No chest pain.   During the evaluation in the emergency department, the patient is noted to  be mildly febrile with a temperature of 100.1.  She is hemodynamically  stable.  Her oxygen saturation ranges between 96 and 99% on 2 liters nasal  cannula oxygen.  The respiratory rate ranges between 26 and 28.  Her lab  data are significant for a BNP of 1680.  Her sodium is low at 128.  Her INR  is mild supratherapeutic at 3.1.  Chest x-ray reveals little interval change  in moderate large right pleural effusion.  The patient will be admitted for  further evaluation and management.   PAST MEDICAL HISTORY:  1.  Hospitalization from June 17, 2005, to June 27, 2005, for newly  diagnosed thyrotoxicosis.  Her TSH was 0.008, and her free T4 was 3.32.  2.  Thyrotoxicosis, diagnosed December 2006.  The patient had evidence of      multinodular goiter diagnosed per CT scan of the chest on June 18, 2005.  3.  Bilateral pleural effusions, status post ultrasound-guided thoracentesis      x2.  The total volume removed was greater than 2 liters.  The pleural      fluid analysis revealed a total protein of 3.4, LDH 87, pH 7.49,      cholesterol 41, 2% mesothelial cells, white blood cell count 1000, 60%      lymphocytes, and 34% monocytes/macrophages.  4.  Chronic atrial fibrillation, now on Coumadin therapy.  5.  Anemia thought to be secondary to iron deficiency and/or chronic      disease.  Iron studies during the December hospitalization revealed      ferritin 44, vitamin B12 820, folate greater than 20.  6.  Hypertension.  7.  Type 2 diabetes mellitus.  During the hospitalization of December 2006,      Glucophage was discontinued, and the patient was started on treatment       with glipizide.  8.  Hyperlipidemia.  9.  Status post hysterectomy in the past.  10. History of shingles on the left side of the face earlier this year      during the summer per history.   MEDICATIONS:  1.  Coumadin 5 mg daily.  2.  Tapazole 20 mg twice daily.  3.  Verapamil ER 240 mg in the morning and 120 mg at bedtime.  4.  Aspirin 81 mg daily.  5.  Glipizide 2.5 mg daily.  6.  Enalapril 10 mg daily.  7.  Crestor 10 mg daily.  8.  Lopressor 50 mg twice daily.  9.  K-Dur 20 mEq daily.  10. Lasix 40 mg daily.   ALLERGIES:  No known drug allergies.   SOCIAL HISTORY:  The patient now lives with her daughter, Mrs. [name], in [place], [place].  The patient is widowed.  She has six  children.  She denies tobacco, alcohol, and drug use.  She stopped dipping  snuff 1 year ago.  She generally ambulate without any assistance; however,  she sometimes uses a cane.   FAMILY HISTORY:  Her parents are deceased, etiology unknown.   REVIEW OF SYSTEMS:  The patient's Review of Systems is positive for  generalized weakness, shortness of breath, and orthopnea.  She has also had  a chronic productive cough.  Generally a poor appetite.  Otherwise her  Review of Systems is negative.   PHYSICAL EXAMINATION:  VITAL SIGNS: Temperature 99.2, repeated at 100.1.  Blood pressure 126/59, pulse 87, respiratory rate 20, oxygen saturation 96%  on 2 liters nasal cannula oxygen.  GENERAL: The patient is a pleasant, alert, 77 year old, elderly African-  American lady who is currently sitting up in bed in mild respiratory  discomfort.  HEENT:  Head is normocephalic and atraumatic.  Pupils equal, round, and  reactive to light.  Extraocular movements intact.  Conjunctivae clear.  Sclerae white.  Tympanic membranes are clear bilaterally.  Nasal mucosa is  mildly dry.  No sinus tenderness.  Oropharynx revealed moist mucous membranes.  No posterior exudates or erythema.  The patient has a  full set  of dentures.  NECK: Supple, no adenopathy or bruit.  She does have JVD bilaterally.  She  also  has minimally palpable thyroid nodules, greater on the right.  LUNGS:  The patient's breathing is mildly labored.  No accessory muscles are  used. The patient is able to speak in complete sentences, although she is a  little winded.  Lungs  with grossly decreased breath sounds on the right and  in the bases, clear on the left.  HEART:  Irregularly irregular with no murmurs, rubs, or gallops.  ABDOMEN:  Positive bowel sounds.  Soft, nontender, nondistended.  No  hepatosplenomegaly, no masses palpated.  EXTREMITIES:  Trace to 1+ bilateral pedal edema.  No pretibial edema.  Diffuse arthritic changes of her knees, hands, and feet bilaterally.  No  acute abnormality seen.  The patient has excellent range of motion of her  upper extremities and lower extremities.  NEUROLOGIC:  Alert and oriented x3.  Cranial nerves II-XII intact.  Gait is  intact.  Strength 5/5 throughout.  Sensation is intact.   ADMISSION LABORATORY DATA:  EKG reveals atrial fibrillation with a  junctional pacemaker rhythm, nonspecific T wave abnormalities.  Heart rate  81 beats per minute.   Chest x-ray reveals little interval change in moderate large right pleural  effusion.   WBC 8.7, hemoglobin 9.8, hematocrit 30, platelets 263, MCV 83.4.  Myoglobin  100, CK-MB less than 1, troponin I less than 0.05.  BNP 1680.  Sodium 128,  potassium 4.8, chloride 99, CO2 25, glucose 95, BUN 13, creatinine 0.9,  calcium 9.6.  INR 3.1.   ASSESSMENT:  1.  Shortness of breath and orthopnea secondary to large pleural effusion      and congestive heart failure.  The pleural fluid has apparently re-      accumulated.  The patient was sent home on Lasix 40 mg daily.  The      patient says that she has been compliant with the Lasix.  She may also      have an underlying pneumonia as the chest x-ray also reveals atelectasis      or  consolidation of the right lung base which apparently is stable      compared to the chest x-ray on July 13, 2005.  She is mildly      febrile.  2.  Possible right lower lobe pneumonia.  3.  Hyponatremia.  The hyponatremia may be secondary to congestive heart      failure.  Her serum sodium prior to discharge June 27, 2005, was      139.  If the patient's serum sodium does not improve with Lasix therapy,      sodium studies will need to be ordered.  4.  Recently diagnosed thyrotoxicosis.  The patient was started on Tapazole.      She also has a multinodular goiter per CT scan of the chest on June 18, 2005.  The plan was to consult an endocrinologist as an outpatient;      however, this has not been done.  The patient will need an in-house      endocrinology consultation as the patient's family requests that the she      is evaluated by one.  5.  Chronic atrial fibrillation on chronic Coumadin therapy.  The patient     appears to be more or less stable on Coumadin, Lopressor, and verapamil  6.  Hypertension. The patient's blood pressures are low-normal currently.  7.  Type 2 diabetes mellitus.  The patient's blood glucose is within normal  limits currently.   PLAN:  1.  The patient will be readmitted for further evaluation and management.  2.  We will start Lasix therapy intravenously at  60 mg q.12h. and adjust      accordingly.  Strict I's and O's and daily weights.  We will place a      Foley catheter.  3.  Will give vitamin K 2.5 mg IV x1 to reverse the anticoagulation. We will      then consult interventional radiology for an ultrasound-guided      thoracentesis. We will also check studies on the pleural fluid.  4.  We will order a 2-D echocardiogram to evaluate the patient's left      ventricular function.  5.  Will follow the patient's serum sodium and potassium as she is being      diuresed.  6.  Will decrease the doses of Lopressor and verapamil for now as  the      patient's blood pressure is within normal limits.  As the patient's      blood pressure improves or increases, we will increase the dose of      Lopressor and verapamil and restart Enalapril.  7.  We will start a multivitamin with iron and Nu-Iron daily for anemia.  8.  We will start Avelox 400 mg IV daily for empiric treatment of probable      pneumonia.  9.  Consult an endocrinologist.  10. The patient is a full code.      Elliot Cousin, M.D.  Electronically Signed     DF/MEDQ  D:  07/18/2005  T:  07/18/2005  Job:  [id number]   cc:   Renaye Rakers, M.D.  Fax: [phone]

## 2011-07-26 DIAGNOSIS — I4891 Unspecified atrial fibrillation: Secondary | ICD-10-CM | POA: Diagnosis not present

## 2011-08-09 DIAGNOSIS — I4891 Unspecified atrial fibrillation: Secondary | ICD-10-CM | POA: Diagnosis not present

## 2011-08-29 DIAGNOSIS — I4891 Unspecified atrial fibrillation: Secondary | ICD-10-CM | POA: Diagnosis not present

## 2011-09-22 DIAGNOSIS — I4891 Unspecified atrial fibrillation: Secondary | ICD-10-CM | POA: Diagnosis not present

## 2011-10-14 DIAGNOSIS — I10 Essential (primary) hypertension: Secondary | ICD-10-CM | POA: Diagnosis not present

## 2011-10-14 DIAGNOSIS — I4891 Unspecified atrial fibrillation: Secondary | ICD-10-CM | POA: Diagnosis not present

## 2011-10-18 DIAGNOSIS — H40019 Open angle with borderline findings, low risk, unspecified eye: Secondary | ICD-10-CM | POA: Diagnosis not present

## 2011-10-18 DIAGNOSIS — E119 Type 2 diabetes mellitus without complications: Secondary | ICD-10-CM | POA: Diagnosis not present

## 2011-10-18 DIAGNOSIS — H2510 Age-related nuclear cataract, unspecified eye: Secondary | ICD-10-CM | POA: Diagnosis not present

## 2011-10-21 DIAGNOSIS — I4891 Unspecified atrial fibrillation: Secondary | ICD-10-CM | POA: Diagnosis not present

## 2011-11-04 DIAGNOSIS — I10 Essential (primary) hypertension: Secondary | ICD-10-CM | POA: Diagnosis not present

## 2011-11-04 DIAGNOSIS — I4891 Unspecified atrial fibrillation: Secondary | ICD-10-CM | POA: Diagnosis not present

## 2011-11-28 DIAGNOSIS — I4891 Unspecified atrial fibrillation: Secondary | ICD-10-CM | POA: Diagnosis not present

## 2011-12-26 DIAGNOSIS — Z1231 Encounter for screening mammogram for malignant neoplasm of breast: Secondary | ICD-10-CM | POA: Diagnosis not present

## 2011-12-26 DIAGNOSIS — I4891 Unspecified atrial fibrillation: Secondary | ICD-10-CM | POA: Diagnosis not present

## 2012-01-17 DIAGNOSIS — I4891 Unspecified atrial fibrillation: Secondary | ICD-10-CM | POA: Diagnosis not present

## 2012-01-27 DIAGNOSIS — E119 Type 2 diabetes mellitus without complications: Secondary | ICD-10-CM | POA: Diagnosis not present

## 2012-01-27 DIAGNOSIS — I10 Essential (primary) hypertension: Secondary | ICD-10-CM | POA: Diagnosis not present

## 2012-01-27 DIAGNOSIS — D649 Anemia, unspecified: Secondary | ICD-10-CM | POA: Diagnosis not present

## 2012-01-27 DIAGNOSIS — E785 Hyperlipidemia, unspecified: Secondary | ICD-10-CM | POA: Diagnosis not present

## 2012-02-20 DIAGNOSIS — I10 Essential (primary) hypertension: Secondary | ICD-10-CM | POA: Diagnosis not present

## 2012-02-20 DIAGNOSIS — I4891 Unspecified atrial fibrillation: Secondary | ICD-10-CM | POA: Diagnosis not present

## 2012-02-20 DIAGNOSIS — E119 Type 2 diabetes mellitus without complications: Secondary | ICD-10-CM | POA: Diagnosis not present

## 2012-03-20 DIAGNOSIS — I4891 Unspecified atrial fibrillation: Secondary | ICD-10-CM | POA: Diagnosis not present

## 2012-04-02 DIAGNOSIS — N184 Chronic kidney disease, stage 4 (severe): Secondary | ICD-10-CM | POA: Diagnosis not present

## 2012-04-02 DIAGNOSIS — R609 Edema, unspecified: Secondary | ICD-10-CM | POA: Diagnosis not present

## 2012-04-02 DIAGNOSIS — I4891 Unspecified atrial fibrillation: Secondary | ICD-10-CM | POA: Diagnosis not present

## 2012-04-02 DIAGNOSIS — R188 Other ascites: Secondary | ICD-10-CM | POA: Diagnosis not present

## 2012-04-17 DIAGNOSIS — Z23 Encounter for immunization: Secondary | ICD-10-CM | POA: Diagnosis not present

## 2012-04-18 DIAGNOSIS — H2510 Age-related nuclear cataract, unspecified eye: Secondary | ICD-10-CM | POA: Diagnosis not present

## 2012-04-18 DIAGNOSIS — Z961 Presence of intraocular lens: Secondary | ICD-10-CM | POA: Diagnosis not present

## 2012-04-27 DIAGNOSIS — I10 Essential (primary) hypertension: Secondary | ICD-10-CM | POA: Diagnosis not present

## 2012-04-27 DIAGNOSIS — E119 Type 2 diabetes mellitus without complications: Secondary | ICD-10-CM | POA: Diagnosis not present

## 2012-04-27 DIAGNOSIS — E785 Hyperlipidemia, unspecified: Secondary | ICD-10-CM | POA: Diagnosis not present

## 2012-05-02 DIAGNOSIS — I4891 Unspecified atrial fibrillation: Secondary | ICD-10-CM | POA: Diagnosis not present

## 2012-05-10 DIAGNOSIS — E559 Vitamin D deficiency, unspecified: Secondary | ICD-10-CM | POA: Diagnosis not present

## 2012-06-20 DIAGNOSIS — Z7901 Long term (current) use of anticoagulants: Secondary | ICD-10-CM | POA: Diagnosis not present

## 2012-06-20 DIAGNOSIS — Z5181 Encounter for therapeutic drug level monitoring: Secondary | ICD-10-CM | POA: Diagnosis not present

## 2012-06-28 DIAGNOSIS — E119 Type 2 diabetes mellitus without complications: Secondary | ICD-10-CM | POA: Diagnosis not present

## 2012-06-28 DIAGNOSIS — E785 Hyperlipidemia, unspecified: Secondary | ICD-10-CM | POA: Diagnosis not present

## 2012-06-28 DIAGNOSIS — I10 Essential (primary) hypertension: Secondary | ICD-10-CM | POA: Diagnosis not present

## 2012-07-04 DIAGNOSIS — I2510 Atherosclerotic heart disease of native coronary artery without angina pectoris: Secondary | ICD-10-CM | POA: Diagnosis not present

## 2012-07-04 DIAGNOSIS — I4891 Unspecified atrial fibrillation: Secondary | ICD-10-CM | POA: Diagnosis not present

## 2012-07-27 DIAGNOSIS — Z7901 Long term (current) use of anticoagulants: Secondary | ICD-10-CM | POA: Diagnosis not present

## 2012-07-27 DIAGNOSIS — Z5181 Encounter for therapeutic drug level monitoring: Secondary | ICD-10-CM | POA: Diagnosis not present

## 2012-08-03 DIAGNOSIS — L84 Corns and callosities: Secondary | ICD-10-CM | POA: Diagnosis not present

## 2012-08-03 DIAGNOSIS — E876 Hypokalemia: Secondary | ICD-10-CM | POA: Diagnosis not present

## 2012-08-03 DIAGNOSIS — E119 Type 2 diabetes mellitus without complications: Secondary | ICD-10-CM | POA: Diagnosis not present

## 2012-08-03 DIAGNOSIS — I10 Essential (primary) hypertension: Secondary | ICD-10-CM | POA: Diagnosis not present

## 2012-08-03 DIAGNOSIS — I4891 Unspecified atrial fibrillation: Secondary | ICD-10-CM | POA: Diagnosis not present

## 2012-08-13 DIAGNOSIS — I4891 Unspecified atrial fibrillation: Secondary | ICD-10-CM | POA: Diagnosis not present

## 2012-08-20 DIAGNOSIS — N184 Chronic kidney disease, stage 4 (severe): Secondary | ICD-10-CM | POA: Diagnosis not present

## 2012-08-27 DIAGNOSIS — Z5181 Encounter for therapeutic drug level monitoring: Secondary | ICD-10-CM | POA: Diagnosis not present

## 2012-08-27 DIAGNOSIS — Z7901 Long term (current) use of anticoagulants: Secondary | ICD-10-CM | POA: Diagnosis not present

## 2012-09-20 DIAGNOSIS — I10 Essential (primary) hypertension: Secondary | ICD-10-CM | POA: Diagnosis not present

## 2012-09-20 DIAGNOSIS — Z7901 Long term (current) use of anticoagulants: Secondary | ICD-10-CM | POA: Diagnosis not present

## 2012-09-20 DIAGNOSIS — E119 Type 2 diabetes mellitus without complications: Secondary | ICD-10-CM | POA: Diagnosis not present

## 2012-09-20 DIAGNOSIS — E785 Hyperlipidemia, unspecified: Secondary | ICD-10-CM | POA: Diagnosis not present

## 2012-10-05 DIAGNOSIS — L84 Corns and callosities: Secondary | ICD-10-CM | POA: Diagnosis not present

## 2012-10-05 DIAGNOSIS — E119 Type 2 diabetes mellitus without complications: Secondary | ICD-10-CM | POA: Diagnosis not present

## 2012-10-05 DIAGNOSIS — I10 Essential (primary) hypertension: Secondary | ICD-10-CM | POA: Diagnosis not present

## 2012-10-05 DIAGNOSIS — E785 Hyperlipidemia, unspecified: Secondary | ICD-10-CM | POA: Diagnosis not present

## 2012-11-05 DIAGNOSIS — Z7901 Long term (current) use of anticoagulants: Secondary | ICD-10-CM | POA: Diagnosis not present

## 2012-11-05 DIAGNOSIS — Z5181 Encounter for therapeutic drug level monitoring: Secondary | ICD-10-CM | POA: Diagnosis not present

## 2012-12-07 DIAGNOSIS — Z7901 Long term (current) use of anticoagulants: Secondary | ICD-10-CM | POA: Diagnosis not present

## 2012-12-07 DIAGNOSIS — Z5181 Encounter for therapeutic drug level monitoring: Secondary | ICD-10-CM | POA: Diagnosis not present

## 2012-12-24 DIAGNOSIS — Z7901 Long term (current) use of anticoagulants: Secondary | ICD-10-CM | POA: Diagnosis not present

## 2012-12-24 DIAGNOSIS — Z5181 Encounter for therapeutic drug level monitoring: Secondary | ICD-10-CM | POA: Diagnosis not present

## 2013-01-14 DIAGNOSIS — Z5181 Encounter for therapeutic drug level monitoring: Secondary | ICD-10-CM | POA: Diagnosis not present

## 2013-01-14 DIAGNOSIS — Z7901 Long term (current) use of anticoagulants: Secondary | ICD-10-CM | POA: Diagnosis not present

## 2013-02-05 DIAGNOSIS — Z5181 Encounter for therapeutic drug level monitoring: Secondary | ICD-10-CM | POA: Diagnosis not present

## 2013-02-05 DIAGNOSIS — Z7901 Long term (current) use of anticoagulants: Secondary | ICD-10-CM | POA: Diagnosis not present

## 2013-03-06 DIAGNOSIS — Z7901 Long term (current) use of anticoagulants: Secondary | ICD-10-CM | POA: Diagnosis not present

## 2013-03-06 DIAGNOSIS — Z5181 Encounter for therapeutic drug level monitoring: Secondary | ICD-10-CM | POA: Diagnosis not present

## 2013-04-08 DIAGNOSIS — Z7901 Long term (current) use of anticoagulants: Secondary | ICD-10-CM | POA: Diagnosis not present

## 2013-04-08 DIAGNOSIS — N183 Chronic kidney disease, stage 3 unspecified: Secondary | ICD-10-CM | POA: Diagnosis not present

## 2013-04-08 DIAGNOSIS — Z5181 Encounter for therapeutic drug level monitoring: Secondary | ICD-10-CM | POA: Diagnosis not present

## 2013-04-16 DIAGNOSIS — Z5181 Encounter for therapeutic drug level monitoring: Secondary | ICD-10-CM | POA: Diagnosis not present

## 2013-04-16 DIAGNOSIS — Z7901 Long term (current) use of anticoagulants: Secondary | ICD-10-CM | POA: Diagnosis not present

## 2013-04-22 DIAGNOSIS — Z7901 Long term (current) use of anticoagulants: Secondary | ICD-10-CM | POA: Diagnosis not present

## 2013-04-22 DIAGNOSIS — Z5181 Encounter for therapeutic drug level monitoring: Secondary | ICD-10-CM | POA: Diagnosis not present

## 2013-06-19 DIAGNOSIS — E876 Hypokalemia: Secondary | ICD-10-CM | POA: Diagnosis not present

## 2013-06-19 DIAGNOSIS — I10 Essential (primary) hypertension: Secondary | ICD-10-CM | POA: Diagnosis not present

## 2013-06-19 DIAGNOSIS — I4891 Unspecified atrial fibrillation: Secondary | ICD-10-CM | POA: Diagnosis not present

## 2013-06-19 DIAGNOSIS — Z7901 Long term (current) use of anticoagulants: Secondary | ICD-10-CM | POA: Diagnosis not present

## 2013-06-19 DIAGNOSIS — E119 Type 2 diabetes mellitus without complications: Secondary | ICD-10-CM | POA: Diagnosis not present

## 2013-06-19 DIAGNOSIS — Z5181 Encounter for therapeutic drug level monitoring: Secondary | ICD-10-CM | POA: Diagnosis not present

## 2013-07-04 DIAGNOSIS — I4891 Unspecified atrial fibrillation: Secondary | ICD-10-CM | POA: Diagnosis not present

## 2013-07-17 DIAGNOSIS — I959 Hypotension, unspecified: Secondary | ICD-10-CM | POA: Diagnosis not present

## 2013-07-17 DIAGNOSIS — I4891 Unspecified atrial fibrillation: Secondary | ICD-10-CM | POA: Diagnosis not present

## 2013-07-17 DIAGNOSIS — I2510 Atherosclerotic heart disease of native coronary artery without angina pectoris: Secondary | ICD-10-CM | POA: Diagnosis not present

## 2013-07-22 DIAGNOSIS — E559 Vitamin D deficiency, unspecified: Secondary | ICD-10-CM | POA: Diagnosis not present

## 2013-08-08 DIAGNOSIS — I4891 Unspecified atrial fibrillation: Secondary | ICD-10-CM | POA: Diagnosis not present

## 2013-09-09 DIAGNOSIS — I4891 Unspecified atrial fibrillation: Secondary | ICD-10-CM | POA: Diagnosis not present

## 2013-09-17 DIAGNOSIS — E119 Type 2 diabetes mellitus without complications: Secondary | ICD-10-CM | POA: Diagnosis not present

## 2013-09-17 DIAGNOSIS — E559 Vitamin D deficiency, unspecified: Secondary | ICD-10-CM | POA: Diagnosis not present

## 2013-09-17 DIAGNOSIS — E785 Hyperlipidemia, unspecified: Secondary | ICD-10-CM | POA: Diagnosis not present

## 2013-09-17 DIAGNOSIS — I10 Essential (primary) hypertension: Secondary | ICD-10-CM | POA: Diagnosis not present

## 2013-10-03 DIAGNOSIS — E119 Type 2 diabetes mellitus without complications: Secondary | ICD-10-CM | POA: Diagnosis not present

## 2013-10-03 DIAGNOSIS — I10 Essential (primary) hypertension: Secondary | ICD-10-CM | POA: Diagnosis not present

## 2013-10-03 DIAGNOSIS — E785 Hyperlipidemia, unspecified: Secondary | ICD-10-CM | POA: Diagnosis not present

## 2013-10-03 DIAGNOSIS — I4891 Unspecified atrial fibrillation: Secondary | ICD-10-CM | POA: Diagnosis not present

## 2013-10-07 DIAGNOSIS — N184 Chronic kidney disease, stage 4 (severe): Secondary | ICD-10-CM | POA: Diagnosis not present

## 2013-10-08 DIAGNOSIS — L84 Corns and callosities: Secondary | ICD-10-CM | POA: Diagnosis not present

## 2013-10-08 DIAGNOSIS — Z7982 Long term (current) use of aspirin: Secondary | ICD-10-CM | POA: Diagnosis not present

## 2013-10-08 DIAGNOSIS — E119 Type 2 diabetes mellitus without complications: Secondary | ICD-10-CM | POA: Diagnosis not present

## 2013-10-08 DIAGNOSIS — G8929 Other chronic pain: Secondary | ICD-10-CM | POA: Diagnosis not present

## 2013-10-08 DIAGNOSIS — Z90710 Acquired absence of both cervix and uterus: Secondary | ICD-10-CM | POA: Diagnosis not present

## 2013-10-09 DIAGNOSIS — L84 Corns and callosities: Secondary | ICD-10-CM | POA: Diagnosis not present

## 2013-10-14 DIAGNOSIS — L84 Corns and callosities: Secondary | ICD-10-CM | POA: Diagnosis not present

## 2013-10-18 DIAGNOSIS — L84 Corns and callosities: Secondary | ICD-10-CM | POA: Diagnosis not present

## 2013-10-18 DIAGNOSIS — IMO0001 Reserved for inherently not codable concepts without codable children: Secondary | ICD-10-CM | POA: Diagnosis not present

## 2013-10-18 DIAGNOSIS — I4891 Unspecified atrial fibrillation: Secondary | ICD-10-CM | POA: Diagnosis not present

## 2013-10-18 DIAGNOSIS — I10 Essential (primary) hypertension: Secondary | ICD-10-CM | POA: Diagnosis not present

## 2013-11-08 DIAGNOSIS — I4891 Unspecified atrial fibrillation: Secondary | ICD-10-CM | POA: Diagnosis not present

## 2013-11-21 DIAGNOSIS — Z0000 Encounter for general adult medical examination without abnormal findings: Secondary | ICD-10-CM | POA: Diagnosis not present

## 2013-12-10 DIAGNOSIS — I4891 Unspecified atrial fibrillation: Secondary | ICD-10-CM | POA: Diagnosis not present

## 2013-12-10 DIAGNOSIS — R0609 Other forms of dyspnea: Secondary | ICD-10-CM | POA: Diagnosis not present

## 2014-01-02 DIAGNOSIS — I4891 Unspecified atrial fibrillation: Secondary | ICD-10-CM | POA: Diagnosis not present

## 2014-01-06 DIAGNOSIS — N184 Chronic kidney disease, stage 4 (severe): Secondary | ICD-10-CM | POA: Diagnosis not present

## 2014-01-11 DIAGNOSIS — R918 Other nonspecific abnormal finding of lung field: Secondary | ICD-10-CM | POA: Diagnosis not present

## 2014-01-11 DIAGNOSIS — I495 Sick sinus syndrome: Secondary | ICD-10-CM | POA: Diagnosis not present

## 2014-01-11 DIAGNOSIS — J90 Pleural effusion, not elsewhere classified: Secondary | ICD-10-CM | POA: Diagnosis not present

## 2014-01-11 DIAGNOSIS — J9600 Acute respiratory failure, unspecified whether with hypoxia or hypercapnia: Secondary | ICD-10-CM | POA: Diagnosis not present

## 2014-01-11 DIAGNOSIS — Z5309 Procedure and treatment not carried out because of other contraindication: Secondary | ICD-10-CM | POA: Diagnosis not present

## 2014-01-11 DIAGNOSIS — I129 Hypertensive chronic kidney disease with stage 1 through stage 4 chronic kidney disease, or unspecified chronic kidney disease: Secondary | ICD-10-CM | POA: Diagnosis not present

## 2014-01-11 DIAGNOSIS — Z7901 Long term (current) use of anticoagulants: Secondary | ICD-10-CM | POA: Diagnosis not present

## 2014-01-11 DIAGNOSIS — M310 Hypersensitivity angiitis: Secondary | ICD-10-CM | POA: Diagnosis not present

## 2014-01-11 DIAGNOSIS — R0602 Shortness of breath: Secondary | ICD-10-CM | POA: Diagnosis not present

## 2014-01-11 DIAGNOSIS — Z45018 Encounter for adjustment and management of other part of cardiac pacemaker: Secondary | ICD-10-CM | POA: Diagnosis not present

## 2014-01-11 DIAGNOSIS — J984 Other disorders of lung: Secondary | ICD-10-CM | POA: Diagnosis present

## 2014-01-11 DIAGNOSIS — Z79899 Other long term (current) drug therapy: Secondary | ICD-10-CM | POA: Diagnosis not present

## 2014-01-11 DIAGNOSIS — I871 Compression of vein: Secondary | ICD-10-CM | POA: Diagnosis not present

## 2014-01-11 DIAGNOSIS — J9819 Other pulmonary collapse: Secondary | ICD-10-CM | POA: Diagnosis not present

## 2014-01-11 DIAGNOSIS — Q278 Other specified congenital malformations of peripheral vascular system: Secondary | ICD-10-CM | POA: Diagnosis not present

## 2014-01-11 DIAGNOSIS — IMO0001 Reserved for inherently not codable concepts without codable children: Secondary | ICD-10-CM | POA: Diagnosis not present

## 2014-01-11 DIAGNOSIS — I5030 Unspecified diastolic (congestive) heart failure: Secondary | ICD-10-CM | POA: Diagnosis not present

## 2014-01-11 DIAGNOSIS — J189 Pneumonia, unspecified organism: Secondary | ICD-10-CM | POA: Diagnosis not present

## 2014-01-11 DIAGNOSIS — I7300 Raynaud's syndrome without gangrene: Secondary | ICD-10-CM | POA: Diagnosis not present

## 2014-01-11 DIAGNOSIS — I509 Heart failure, unspecified: Secondary | ICD-10-CM | POA: Diagnosis not present

## 2014-01-11 DIAGNOSIS — I214 Non-ST elevation (NSTEMI) myocardial infarction: Secondary | ICD-10-CM | POA: Diagnosis not present

## 2014-01-11 DIAGNOSIS — I5033 Acute on chronic diastolic (congestive) heart failure: Secondary | ICD-10-CM | POA: Diagnosis not present

## 2014-01-11 DIAGNOSIS — N189 Chronic kidney disease, unspecified: Secondary | ICD-10-CM | POA: Diagnosis not present

## 2014-01-11 DIAGNOSIS — Z7982 Long term (current) use of aspirin: Secondary | ICD-10-CM | POA: Diagnosis not present

## 2014-01-11 DIAGNOSIS — I4891 Unspecified atrial fibrillation: Secondary | ICD-10-CM | POA: Diagnosis not present

## 2014-01-21 DIAGNOSIS — I4891 Unspecified atrial fibrillation: Secondary | ICD-10-CM | POA: Diagnosis not present

## 2014-01-21 DIAGNOSIS — I495 Sick sinus syndrome: Secondary | ICD-10-CM | POA: Diagnosis not present

## 2014-01-22 DIAGNOSIS — I509 Heart failure, unspecified: Secondary | ICD-10-CM | POA: Diagnosis not present

## 2014-01-22 DIAGNOSIS — IMO0001 Reserved for inherently not codable concepts without codable children: Secondary | ICD-10-CM | POA: Diagnosis not present

## 2014-01-22 DIAGNOSIS — R269 Unspecified abnormalities of gait and mobility: Secondary | ICD-10-CM | POA: Diagnosis not present

## 2014-01-22 DIAGNOSIS — J449 Chronic obstructive pulmonary disease, unspecified: Secondary | ICD-10-CM | POA: Diagnosis not present

## 2014-01-22 DIAGNOSIS — I4891 Unspecified atrial fibrillation: Secondary | ICD-10-CM | POA: Diagnosis not present

## 2014-01-22 DIAGNOSIS — I214 Non-ST elevation (NSTEMI) myocardial infarction: Secondary | ICD-10-CM | POA: Diagnosis not present

## 2014-01-24 DIAGNOSIS — I4891 Unspecified atrial fibrillation: Secondary | ICD-10-CM | POA: Diagnosis not present

## 2014-01-24 DIAGNOSIS — R269 Unspecified abnormalities of gait and mobility: Secondary | ICD-10-CM | POA: Diagnosis not present

## 2014-01-24 DIAGNOSIS — J449 Chronic obstructive pulmonary disease, unspecified: Secondary | ICD-10-CM | POA: Diagnosis not present

## 2014-01-24 DIAGNOSIS — I214 Non-ST elevation (NSTEMI) myocardial infarction: Secondary | ICD-10-CM | POA: Diagnosis not present

## 2014-01-24 DIAGNOSIS — IMO0001 Reserved for inherently not codable concepts without codable children: Secondary | ICD-10-CM | POA: Diagnosis not present

## 2014-01-24 DIAGNOSIS — I509 Heart failure, unspecified: Secondary | ICD-10-CM | POA: Diagnosis not present

## 2014-01-27 DIAGNOSIS — I129 Hypertensive chronic kidney disease with stage 1 through stage 4 chronic kidney disease, or unspecified chronic kidney disease: Secondary | ICD-10-CM | POA: Diagnosis not present

## 2014-01-27 DIAGNOSIS — N184 Chronic kidney disease, stage 4 (severe): Secondary | ICD-10-CM | POA: Diagnosis not present

## 2014-01-28 DIAGNOSIS — IMO0001 Reserved for inherently not codable concepts without codable children: Secondary | ICD-10-CM | POA: Diagnosis not present

## 2014-01-28 DIAGNOSIS — J449 Chronic obstructive pulmonary disease, unspecified: Secondary | ICD-10-CM | POA: Diagnosis not present

## 2014-01-28 DIAGNOSIS — I4891 Unspecified atrial fibrillation: Secondary | ICD-10-CM | POA: Diagnosis not present

## 2014-01-28 DIAGNOSIS — I214 Non-ST elevation (NSTEMI) myocardial infarction: Secondary | ICD-10-CM | POA: Diagnosis not present

## 2014-01-28 DIAGNOSIS — I509 Heart failure, unspecified: Secondary | ICD-10-CM | POA: Diagnosis not present

## 2014-01-28 DIAGNOSIS — R269 Unspecified abnormalities of gait and mobility: Secondary | ICD-10-CM | POA: Diagnosis not present

## 2014-01-30 DIAGNOSIS — I214 Non-ST elevation (NSTEMI) myocardial infarction: Secondary | ICD-10-CM | POA: Diagnosis not present

## 2014-01-30 DIAGNOSIS — R269 Unspecified abnormalities of gait and mobility: Secondary | ICD-10-CM | POA: Diagnosis not present

## 2014-01-30 DIAGNOSIS — I509 Heart failure, unspecified: Secondary | ICD-10-CM | POA: Diagnosis not present

## 2014-01-30 DIAGNOSIS — I4891 Unspecified atrial fibrillation: Secondary | ICD-10-CM | POA: Diagnosis not present

## 2014-01-30 DIAGNOSIS — J449 Chronic obstructive pulmonary disease, unspecified: Secondary | ICD-10-CM | POA: Diagnosis not present

## 2014-01-30 DIAGNOSIS — IMO0001 Reserved for inherently not codable concepts without codable children: Secondary | ICD-10-CM | POA: Diagnosis not present

## 2014-02-01 DIAGNOSIS — I4891 Unspecified atrial fibrillation: Secondary | ICD-10-CM | POA: Diagnosis not present

## 2014-02-01 DIAGNOSIS — J9819 Other pulmonary collapse: Secondary | ICD-10-CM | POA: Diagnosis not present

## 2014-02-01 DIAGNOSIS — R079 Chest pain, unspecified: Secondary | ICD-10-CM | POA: Diagnosis not present

## 2014-02-01 DIAGNOSIS — M310 Hypersensitivity angiitis: Secondary | ICD-10-CM | POA: Diagnosis not present

## 2014-02-01 DIAGNOSIS — I252 Old myocardial infarction: Secondary | ICD-10-CM | POA: Diagnosis not present

## 2014-02-01 DIAGNOSIS — N189 Chronic kidney disease, unspecified: Secondary | ICD-10-CM | POA: Diagnosis present

## 2014-02-01 DIAGNOSIS — I5031 Acute diastolic (congestive) heart failure: Secondary | ICD-10-CM | POA: Diagnosis not present

## 2014-02-01 DIAGNOSIS — F0280 Dementia in other diseases classified elsewhere without behavioral disturbance: Secondary | ICD-10-CM | POA: Diagnosis not present

## 2014-02-01 DIAGNOSIS — I10 Essential (primary) hypertension: Secondary | ICD-10-CM | POA: Diagnosis not present

## 2014-02-01 DIAGNOSIS — I200 Unstable angina: Secondary | ICD-10-CM | POA: Diagnosis not present

## 2014-02-01 DIAGNOSIS — R0609 Other forms of dyspnea: Secondary | ICD-10-CM | POA: Diagnosis not present

## 2014-02-01 DIAGNOSIS — I495 Sick sinus syndrome: Secondary | ICD-10-CM | POA: Diagnosis not present

## 2014-02-01 DIAGNOSIS — I7300 Raynaud's syndrome without gangrene: Secondary | ICD-10-CM | POA: Diagnosis present

## 2014-02-01 DIAGNOSIS — I509 Heart failure, unspecified: Secondary | ICD-10-CM | POA: Diagnosis not present

## 2014-02-01 DIAGNOSIS — I359 Nonrheumatic aortic valve disorder, unspecified: Secondary | ICD-10-CM | POA: Diagnosis not present

## 2014-02-01 DIAGNOSIS — I5022 Chronic systolic (congestive) heart failure: Secondary | ICD-10-CM | POA: Diagnosis not present

## 2014-02-01 DIAGNOSIS — N183 Chronic kidney disease, stage 3 unspecified: Secondary | ICD-10-CM | POA: Diagnosis not present

## 2014-02-03 DIAGNOSIS — R5381 Other malaise: Secondary | ICD-10-CM | POA: Diagnosis present

## 2014-02-03 DIAGNOSIS — I509 Heart failure, unspecified: Secondary | ICD-10-CM | POA: Diagnosis present

## 2014-02-03 DIAGNOSIS — E876 Hypokalemia: Secondary | ICD-10-CM | POA: Diagnosis present

## 2014-02-03 DIAGNOSIS — I200 Unstable angina: Secondary | ICD-10-CM | POA: Diagnosis not present

## 2014-02-03 DIAGNOSIS — E119 Type 2 diabetes mellitus without complications: Secondary | ICD-10-CM | POA: Diagnosis present

## 2014-02-03 DIAGNOSIS — I359 Nonrheumatic aortic valve disorder, unspecified: Secondary | ICD-10-CM | POA: Diagnosis not present

## 2014-02-03 DIAGNOSIS — N183 Chronic kidney disease, stage 3 unspecified: Secondary | ICD-10-CM | POA: Diagnosis present

## 2014-02-03 DIAGNOSIS — I495 Sick sinus syndrome: Secondary | ICD-10-CM | POA: Diagnosis not present

## 2014-02-03 DIAGNOSIS — I4891 Unspecified atrial fibrillation: Secondary | ICD-10-CM | POA: Diagnosis not present

## 2014-02-03 DIAGNOSIS — G3184 Mild cognitive impairment, so stated: Secondary | ICD-10-CM | POA: Diagnosis present

## 2014-02-03 DIAGNOSIS — I129 Hypertensive chronic kidney disease with stage 1 through stage 4 chronic kidney disease, or unspecified chronic kidney disease: Secondary | ICD-10-CM | POA: Diagnosis present

## 2014-02-03 DIAGNOSIS — R799 Abnormal finding of blood chemistry, unspecified: Secondary | ICD-10-CM | POA: Diagnosis present

## 2014-02-03 DIAGNOSIS — I7300 Raynaud's syndrome without gangrene: Secondary | ICD-10-CM | POA: Diagnosis present

## 2014-02-03 DIAGNOSIS — Z79899 Other long term (current) drug therapy: Secondary | ICD-10-CM | POA: Diagnosis not present

## 2014-02-03 DIAGNOSIS — I5022 Chronic systolic (congestive) heart failure: Secondary | ICD-10-CM | POA: Diagnosis present

## 2014-02-03 DIAGNOSIS — R9431 Abnormal electrocardiogram [ECG] [EKG]: Secondary | ICD-10-CM | POA: Diagnosis not present

## 2014-02-03 DIAGNOSIS — F0280 Dementia in other diseases classified elsewhere without behavioral disturbance: Secondary | ICD-10-CM | POA: Diagnosis present

## 2014-02-03 DIAGNOSIS — I219 Acute myocardial infarction, unspecified: Secondary | ICD-10-CM | POA: Diagnosis not present

## 2014-02-03 DIAGNOSIS — Z950 Presence of cardiac pacemaker: Secondary | ICD-10-CM | POA: Diagnosis not present

## 2014-02-03 DIAGNOSIS — M310 Hypersensitivity angiitis: Secondary | ICD-10-CM | POA: Diagnosis present

## 2014-02-11 DIAGNOSIS — Z48812 Encounter for surgical aftercare following surgery on the circulatory system: Secondary | ICD-10-CM | POA: Diagnosis not present

## 2014-02-11 DIAGNOSIS — I4891 Unspecified atrial fibrillation: Secondary | ICD-10-CM | POA: Diagnosis not present

## 2014-02-11 DIAGNOSIS — I509 Heart failure, unspecified: Secondary | ICD-10-CM | POA: Diagnosis not present

## 2014-02-11 DIAGNOSIS — I359 Nonrheumatic aortic valve disorder, unspecified: Secondary | ICD-10-CM | POA: Diagnosis not present

## 2014-02-11 DIAGNOSIS — I5030 Unspecified diastolic (congestive) heart failure: Secondary | ICD-10-CM | POA: Diagnosis not present

## 2014-02-11 DIAGNOSIS — I214 Non-ST elevation (NSTEMI) myocardial infarction: Secondary | ICD-10-CM | POA: Diagnosis not present

## 2014-02-12 DIAGNOSIS — I214 Non-ST elevation (NSTEMI) myocardial infarction: Secondary | ICD-10-CM | POA: Diagnosis not present

## 2014-02-12 DIAGNOSIS — I509 Heart failure, unspecified: Secondary | ICD-10-CM | POA: Diagnosis not present

## 2014-02-12 DIAGNOSIS — I4891 Unspecified atrial fibrillation: Secondary | ICD-10-CM | POA: Diagnosis not present

## 2014-02-12 DIAGNOSIS — I359 Nonrheumatic aortic valve disorder, unspecified: Secondary | ICD-10-CM | POA: Diagnosis not present

## 2014-02-12 DIAGNOSIS — I5030 Unspecified diastolic (congestive) heart failure: Secondary | ICD-10-CM | POA: Diagnosis not present

## 2014-02-12 DIAGNOSIS — Z48812 Encounter for surgical aftercare following surgery on the circulatory system: Secondary | ICD-10-CM | POA: Diagnosis not present

## 2014-02-13 DIAGNOSIS — I428 Other cardiomyopathies: Secondary | ICD-10-CM | POA: Diagnosis not present

## 2014-02-13 DIAGNOSIS — I4891 Unspecified atrial fibrillation: Secondary | ICD-10-CM | POA: Diagnosis not present

## 2014-02-14 DIAGNOSIS — I4891 Unspecified atrial fibrillation: Secondary | ICD-10-CM | POA: Diagnosis not present

## 2014-02-14 DIAGNOSIS — Z48812 Encounter for surgical aftercare following surgery on the circulatory system: Secondary | ICD-10-CM | POA: Diagnosis not present

## 2014-02-14 DIAGNOSIS — I509 Heart failure, unspecified: Secondary | ICD-10-CM | POA: Diagnosis not present

## 2014-02-14 DIAGNOSIS — I5030 Unspecified diastolic (congestive) heart failure: Secondary | ICD-10-CM | POA: Diagnosis not present

## 2014-02-14 DIAGNOSIS — I214 Non-ST elevation (NSTEMI) myocardial infarction: Secondary | ICD-10-CM | POA: Diagnosis not present

## 2014-02-14 DIAGNOSIS — I359 Nonrheumatic aortic valve disorder, unspecified: Secondary | ICD-10-CM | POA: Diagnosis not present

## 2014-02-17 DIAGNOSIS — I5030 Unspecified diastolic (congestive) heart failure: Secondary | ICD-10-CM | POA: Diagnosis not present

## 2014-02-17 DIAGNOSIS — I214 Non-ST elevation (NSTEMI) myocardial infarction: Secondary | ICD-10-CM | POA: Diagnosis not present

## 2014-02-17 DIAGNOSIS — I509 Heart failure, unspecified: Secondary | ICD-10-CM | POA: Diagnosis not present

## 2014-02-17 DIAGNOSIS — I4891 Unspecified atrial fibrillation: Secondary | ICD-10-CM | POA: Diagnosis not present

## 2014-02-17 DIAGNOSIS — I359 Nonrheumatic aortic valve disorder, unspecified: Secondary | ICD-10-CM | POA: Diagnosis not present

## 2014-02-17 DIAGNOSIS — Z48812 Encounter for surgical aftercare following surgery on the circulatory system: Secondary | ICD-10-CM | POA: Diagnosis not present

## 2014-02-18 ENCOUNTER — Other Ambulatory Visit: Payer: Self-pay | Admitting: Internal Medicine

## 2014-02-18 ENCOUNTER — Ambulatory Visit
Admission: RE | Admit: 2014-02-18 | Discharge: 2014-02-18 | Disposition: A | Discharge status: Home / Self Care | Payer: Medicare Other | Source: Ambulatory Visit | Attending: Internal Medicine | Admitting: Internal Medicine

## 2014-02-18 DIAGNOSIS — I509 Heart failure, unspecified: Secondary | ICD-10-CM

## 2014-02-18 DIAGNOSIS — Z48812 Encounter for surgical aftercare following surgery on the circulatory system: Secondary | ICD-10-CM | POA: Diagnosis not present

## 2014-02-18 DIAGNOSIS — Z7189 Other specified counseling: Secondary | ICD-10-CM | POA: Diagnosis not present

## 2014-02-18 DIAGNOSIS — I82409 Acute embolism and thrombosis of unspecified deep veins of unspecified lower extremity: Secondary | ICD-10-CM | POA: Diagnosis not present

## 2014-02-18 DIAGNOSIS — I359 Nonrheumatic aortic valve disorder, unspecified: Secondary | ICD-10-CM | POA: Diagnosis not present

## 2014-02-18 DIAGNOSIS — I10 Essential (primary) hypertension: Secondary | ICD-10-CM | POA: Diagnosis not present

## 2014-02-18 DIAGNOSIS — R918 Other nonspecific abnormal finding of lung field: Secondary | ICD-10-CM | POA: Diagnosis not present

## 2014-02-18 DIAGNOSIS — R946 Abnormal results of thyroid function studies: Secondary | ICD-10-CM | POA: Diagnosis not present

## 2014-02-18 DIAGNOSIS — I4891 Unspecified atrial fibrillation: Secondary | ICD-10-CM | POA: Diagnosis not present

## 2014-02-18 DIAGNOSIS — E119 Type 2 diabetes mellitus without complications: Secondary | ICD-10-CM | POA: Diagnosis not present

## 2014-02-18 DIAGNOSIS — I5030 Unspecified diastolic (congestive) heart failure: Secondary | ICD-10-CM | POA: Diagnosis not present

## 2014-02-18 DIAGNOSIS — J189 Pneumonia, unspecified organism: Secondary | ICD-10-CM | POA: Diagnosis not present

## 2014-02-18 DIAGNOSIS — I214 Non-ST elevation (NSTEMI) myocardial infarction: Secondary | ICD-10-CM | POA: Diagnosis not present

## 2014-02-19 DIAGNOSIS — I359 Nonrheumatic aortic valve disorder, unspecified: Secondary | ICD-10-CM | POA: Diagnosis not present

## 2014-02-19 DIAGNOSIS — Z48812 Encounter for surgical aftercare following surgery on the circulatory system: Secondary | ICD-10-CM | POA: Diagnosis not present

## 2014-02-19 DIAGNOSIS — I4891 Unspecified atrial fibrillation: Secondary | ICD-10-CM | POA: Diagnosis not present

## 2014-02-19 DIAGNOSIS — I214 Non-ST elevation (NSTEMI) myocardial infarction: Secondary | ICD-10-CM | POA: Diagnosis not present

## 2014-02-19 DIAGNOSIS — I509 Heart failure, unspecified: Secondary | ICD-10-CM | POA: Diagnosis not present

## 2014-02-19 DIAGNOSIS — I5030 Unspecified diastolic (congestive) heart failure: Secondary | ICD-10-CM | POA: Diagnosis not present

## 2014-02-20 DIAGNOSIS — I214 Non-ST elevation (NSTEMI) myocardial infarction: Secondary | ICD-10-CM | POA: Diagnosis not present

## 2014-02-20 DIAGNOSIS — Z48812 Encounter for surgical aftercare following surgery on the circulatory system: Secondary | ICD-10-CM | POA: Diagnosis not present

## 2014-02-20 DIAGNOSIS — I5030 Unspecified diastolic (congestive) heart failure: Secondary | ICD-10-CM | POA: Diagnosis not present

## 2014-02-20 DIAGNOSIS — I509 Heart failure, unspecified: Secondary | ICD-10-CM | POA: Diagnosis not present

## 2014-02-20 DIAGNOSIS — I359 Nonrheumatic aortic valve disorder, unspecified: Secondary | ICD-10-CM | POA: Diagnosis not present

## 2014-02-20 DIAGNOSIS — I4891 Unspecified atrial fibrillation: Secondary | ICD-10-CM | POA: Diagnosis not present

## 2014-02-21 DIAGNOSIS — I509 Heart failure, unspecified: Secondary | ICD-10-CM | POA: Diagnosis not present

## 2014-02-21 DIAGNOSIS — I5030 Unspecified diastolic (congestive) heart failure: Secondary | ICD-10-CM | POA: Diagnosis not present

## 2014-02-21 DIAGNOSIS — I214 Non-ST elevation (NSTEMI) myocardial infarction: Secondary | ICD-10-CM | POA: Diagnosis not present

## 2014-02-21 DIAGNOSIS — I359 Nonrheumatic aortic valve disorder, unspecified: Secondary | ICD-10-CM | POA: Diagnosis not present

## 2014-02-21 DIAGNOSIS — Z48812 Encounter for surgical aftercare following surgery on the circulatory system: Secondary | ICD-10-CM | POA: Diagnosis not present

## 2014-02-21 DIAGNOSIS — I4891 Unspecified atrial fibrillation: Secondary | ICD-10-CM | POA: Diagnosis not present

## 2014-02-24 DIAGNOSIS — I509 Heart failure, unspecified: Secondary | ICD-10-CM | POA: Diagnosis not present

## 2014-02-24 DIAGNOSIS — I359 Nonrheumatic aortic valve disorder, unspecified: Secondary | ICD-10-CM | POA: Diagnosis not present

## 2014-02-24 DIAGNOSIS — I214 Non-ST elevation (NSTEMI) myocardial infarction: Secondary | ICD-10-CM | POA: Diagnosis not present

## 2014-02-24 DIAGNOSIS — I5030 Unspecified diastolic (congestive) heart failure: Secondary | ICD-10-CM | POA: Diagnosis not present

## 2014-02-24 DIAGNOSIS — I4891 Unspecified atrial fibrillation: Secondary | ICD-10-CM | POA: Diagnosis not present

## 2014-02-24 DIAGNOSIS — Z48812 Encounter for surgical aftercare following surgery on the circulatory system: Secondary | ICD-10-CM | POA: Diagnosis not present

## 2014-02-25 DIAGNOSIS — I5030 Unspecified diastolic (congestive) heart failure: Secondary | ICD-10-CM | POA: Diagnosis not present

## 2014-02-25 DIAGNOSIS — I214 Non-ST elevation (NSTEMI) myocardial infarction: Secondary | ICD-10-CM | POA: Diagnosis not present

## 2014-02-25 DIAGNOSIS — I4891 Unspecified atrial fibrillation: Secondary | ICD-10-CM | POA: Diagnosis not present

## 2014-02-25 DIAGNOSIS — Z48812 Encounter for surgical aftercare following surgery on the circulatory system: Secondary | ICD-10-CM | POA: Diagnosis not present

## 2014-02-25 DIAGNOSIS — I509 Heart failure, unspecified: Secondary | ICD-10-CM | POA: Diagnosis not present

## 2014-02-25 DIAGNOSIS — I359 Nonrheumatic aortic valve disorder, unspecified: Secondary | ICD-10-CM | POA: Diagnosis not present

## 2014-02-26 DIAGNOSIS — I214 Non-ST elevation (NSTEMI) myocardial infarction: Secondary | ICD-10-CM | POA: Diagnosis not present

## 2014-02-26 DIAGNOSIS — I509 Heart failure, unspecified: Secondary | ICD-10-CM | POA: Diagnosis not present

## 2014-02-26 DIAGNOSIS — I5030 Unspecified diastolic (congestive) heart failure: Secondary | ICD-10-CM | POA: Diagnosis not present

## 2014-02-26 DIAGNOSIS — I359 Nonrheumatic aortic valve disorder, unspecified: Secondary | ICD-10-CM | POA: Diagnosis not present

## 2014-02-26 DIAGNOSIS — I4891 Unspecified atrial fibrillation: Secondary | ICD-10-CM | POA: Diagnosis not present

## 2014-02-26 DIAGNOSIS — Z48812 Encounter for surgical aftercare following surgery on the circulatory system: Secondary | ICD-10-CM | POA: Diagnosis not present

## 2014-02-27 DIAGNOSIS — I509 Heart failure, unspecified: Secondary | ICD-10-CM | POA: Diagnosis not present

## 2014-02-27 DIAGNOSIS — I5030 Unspecified diastolic (congestive) heart failure: Secondary | ICD-10-CM | POA: Diagnosis not present

## 2014-02-27 DIAGNOSIS — I359 Nonrheumatic aortic valve disorder, unspecified: Secondary | ICD-10-CM | POA: Diagnosis not present

## 2014-02-27 DIAGNOSIS — I214 Non-ST elevation (NSTEMI) myocardial infarction: Secondary | ICD-10-CM | POA: Diagnosis not present

## 2014-02-27 DIAGNOSIS — I4891 Unspecified atrial fibrillation: Secondary | ICD-10-CM | POA: Diagnosis not present

## 2014-02-27 DIAGNOSIS — Z48812 Encounter for surgical aftercare following surgery on the circulatory system: Secondary | ICD-10-CM | POA: Diagnosis not present

## 2014-02-28 DIAGNOSIS — I359 Nonrheumatic aortic valve disorder, unspecified: Secondary | ICD-10-CM | POA: Diagnosis not present

## 2014-02-28 DIAGNOSIS — I5030 Unspecified diastolic (congestive) heart failure: Secondary | ICD-10-CM | POA: Diagnosis not present

## 2014-02-28 DIAGNOSIS — I214 Non-ST elevation (NSTEMI) myocardial infarction: Secondary | ICD-10-CM | POA: Diagnosis not present

## 2014-02-28 DIAGNOSIS — Z48812 Encounter for surgical aftercare following surgery on the circulatory system: Secondary | ICD-10-CM | POA: Diagnosis not present

## 2014-02-28 DIAGNOSIS — I4891 Unspecified atrial fibrillation: Secondary | ICD-10-CM | POA: Diagnosis not present

## 2014-02-28 DIAGNOSIS — I509 Heart failure, unspecified: Secondary | ICD-10-CM | POA: Diagnosis not present

## 2014-03-03 DIAGNOSIS — I509 Heart failure, unspecified: Secondary | ICD-10-CM | POA: Diagnosis not present

## 2014-03-03 DIAGNOSIS — I5030 Unspecified diastolic (congestive) heart failure: Secondary | ICD-10-CM | POA: Diagnosis not present

## 2014-03-03 DIAGNOSIS — I4891 Unspecified atrial fibrillation: Secondary | ICD-10-CM | POA: Diagnosis not present

## 2014-03-03 DIAGNOSIS — I214 Non-ST elevation (NSTEMI) myocardial infarction: Secondary | ICD-10-CM | POA: Diagnosis not present

## 2014-03-03 DIAGNOSIS — I359 Nonrheumatic aortic valve disorder, unspecified: Secondary | ICD-10-CM | POA: Diagnosis not present

## 2014-03-03 DIAGNOSIS — Z48812 Encounter for surgical aftercare following surgery on the circulatory system: Secondary | ICD-10-CM | POA: Diagnosis not present

## 2014-03-04 DIAGNOSIS — I214 Non-ST elevation (NSTEMI) myocardial infarction: Secondary | ICD-10-CM | POA: Diagnosis not present

## 2014-03-04 DIAGNOSIS — I5030 Unspecified diastolic (congestive) heart failure: Secondary | ICD-10-CM | POA: Diagnosis not present

## 2014-03-04 DIAGNOSIS — I509 Heart failure, unspecified: Secondary | ICD-10-CM | POA: Diagnosis not present

## 2014-03-04 DIAGNOSIS — Z48812 Encounter for surgical aftercare following surgery on the circulatory system: Secondary | ICD-10-CM | POA: Diagnosis not present

## 2014-03-04 DIAGNOSIS — I359 Nonrheumatic aortic valve disorder, unspecified: Secondary | ICD-10-CM | POA: Diagnosis not present

## 2014-03-04 DIAGNOSIS — I4891 Unspecified atrial fibrillation: Secondary | ICD-10-CM | POA: Diagnosis not present

## 2014-03-05 DIAGNOSIS — Z48812 Encounter for surgical aftercare following surgery on the circulatory system: Secondary | ICD-10-CM | POA: Diagnosis not present

## 2014-03-05 DIAGNOSIS — I214 Non-ST elevation (NSTEMI) myocardial infarction: Secondary | ICD-10-CM | POA: Diagnosis not present

## 2014-03-05 DIAGNOSIS — I359 Nonrheumatic aortic valve disorder, unspecified: Secondary | ICD-10-CM | POA: Diagnosis not present

## 2014-03-05 DIAGNOSIS — I509 Heart failure, unspecified: Secondary | ICD-10-CM | POA: Diagnosis not present

## 2014-03-05 DIAGNOSIS — I5030 Unspecified diastolic (congestive) heart failure: Secondary | ICD-10-CM | POA: Diagnosis not present

## 2014-03-05 DIAGNOSIS — I4891 Unspecified atrial fibrillation: Secondary | ICD-10-CM | POA: Diagnosis not present

## 2014-03-06 DIAGNOSIS — I509 Heart failure, unspecified: Secondary | ICD-10-CM | POA: Diagnosis not present

## 2014-03-06 DIAGNOSIS — I4891 Unspecified atrial fibrillation: Secondary | ICD-10-CM | POA: Diagnosis not present

## 2014-03-06 DIAGNOSIS — Z48812 Encounter for surgical aftercare following surgery on the circulatory system: Secondary | ICD-10-CM | POA: Diagnosis not present

## 2014-03-06 DIAGNOSIS — I5030 Unspecified diastolic (congestive) heart failure: Secondary | ICD-10-CM | POA: Diagnosis not present

## 2014-03-06 DIAGNOSIS — I359 Nonrheumatic aortic valve disorder, unspecified: Secondary | ICD-10-CM | POA: Diagnosis not present

## 2014-03-06 DIAGNOSIS — I214 Non-ST elevation (NSTEMI) myocardial infarction: Secondary | ICD-10-CM | POA: Diagnosis not present

## 2014-03-07 DIAGNOSIS — I4891 Unspecified atrial fibrillation: Secondary | ICD-10-CM | POA: Diagnosis not present

## 2014-03-07 DIAGNOSIS — I359 Nonrheumatic aortic valve disorder, unspecified: Secondary | ICD-10-CM | POA: Diagnosis not present

## 2014-03-07 DIAGNOSIS — I214 Non-ST elevation (NSTEMI) myocardial infarction: Secondary | ICD-10-CM | POA: Diagnosis not present

## 2014-03-07 DIAGNOSIS — I509 Heart failure, unspecified: Secondary | ICD-10-CM | POA: Diagnosis not present

## 2014-03-07 DIAGNOSIS — Z48812 Encounter for surgical aftercare following surgery on the circulatory system: Secondary | ICD-10-CM | POA: Diagnosis not present

## 2014-03-07 DIAGNOSIS — I5030 Unspecified diastolic (congestive) heart failure: Secondary | ICD-10-CM | POA: Diagnosis not present

## 2014-03-10 DIAGNOSIS — Z48812 Encounter for surgical aftercare following surgery on the circulatory system: Secondary | ICD-10-CM | POA: Diagnosis not present

## 2014-03-10 DIAGNOSIS — I359 Nonrheumatic aortic valve disorder, unspecified: Secondary | ICD-10-CM | POA: Diagnosis not present

## 2014-03-10 DIAGNOSIS — I5030 Unspecified diastolic (congestive) heart failure: Secondary | ICD-10-CM | POA: Diagnosis not present

## 2014-03-10 DIAGNOSIS — I509 Heart failure, unspecified: Secondary | ICD-10-CM | POA: Diagnosis not present

## 2014-03-10 DIAGNOSIS — I4891 Unspecified atrial fibrillation: Secondary | ICD-10-CM | POA: Diagnosis not present

## 2014-03-10 DIAGNOSIS — I214 Non-ST elevation (NSTEMI) myocardial infarction: Secondary | ICD-10-CM | POA: Diagnosis not present

## 2014-03-11 DIAGNOSIS — I359 Nonrheumatic aortic valve disorder, unspecified: Secondary | ICD-10-CM | POA: Diagnosis not present

## 2014-03-11 DIAGNOSIS — I509 Heart failure, unspecified: Secondary | ICD-10-CM | POA: Diagnosis not present

## 2014-03-11 DIAGNOSIS — Z48812 Encounter for surgical aftercare following surgery on the circulatory system: Secondary | ICD-10-CM | POA: Diagnosis not present

## 2014-03-11 DIAGNOSIS — I214 Non-ST elevation (NSTEMI) myocardial infarction: Secondary | ICD-10-CM | POA: Diagnosis not present

## 2014-03-11 DIAGNOSIS — I5030 Unspecified diastolic (congestive) heart failure: Secondary | ICD-10-CM | POA: Diagnosis not present

## 2014-03-11 DIAGNOSIS — I4891 Unspecified atrial fibrillation: Secondary | ICD-10-CM | POA: Diagnosis not present

## 2014-03-12 DIAGNOSIS — I509 Heart failure, unspecified: Secondary | ICD-10-CM | POA: Diagnosis not present

## 2014-03-12 DIAGNOSIS — I4891 Unspecified atrial fibrillation: Secondary | ICD-10-CM | POA: Diagnosis not present

## 2014-03-12 DIAGNOSIS — I5030 Unspecified diastolic (congestive) heart failure: Secondary | ICD-10-CM | POA: Diagnosis not present

## 2014-03-12 DIAGNOSIS — I359 Nonrheumatic aortic valve disorder, unspecified: Secondary | ICD-10-CM | POA: Diagnosis not present

## 2014-03-12 DIAGNOSIS — I214 Non-ST elevation (NSTEMI) myocardial infarction: Secondary | ICD-10-CM | POA: Diagnosis not present

## 2014-03-12 DIAGNOSIS — Z48812 Encounter for surgical aftercare following surgery on the circulatory system: Secondary | ICD-10-CM | POA: Diagnosis not present

## 2014-03-13 ENCOUNTER — Ambulatory Visit (INDEPENDENT_AMBULATORY_CARE_PROVIDER_SITE_OTHER): Payer: Medicare Other | Admitting: Internal Medicine

## 2014-03-13 ENCOUNTER — Encounter: Payer: Self-pay | Admitting: *Deleted

## 2014-03-13 ENCOUNTER — Ambulatory Visit
Admission: RE | Admit: 2014-03-13 | Discharge: 2014-03-13 | Disposition: A | Discharge status: Home / Self Care | Payer: Medicare Other | Source: Ambulatory Visit | Attending: Internal Medicine | Admitting: Internal Medicine

## 2014-03-13 ENCOUNTER — Encounter: Payer: Self-pay | Admitting: Internal Medicine

## 2014-03-13 VITALS — BP 134/90 | HR 95 | Ht 64.0 in | Wt 146.4 lb

## 2014-03-13 DIAGNOSIS — I509 Heart failure, unspecified: Secondary | ICD-10-CM | POA: Diagnosis not present

## 2014-03-13 DIAGNOSIS — Z48812 Encounter for surgical aftercare following surgery on the circulatory system: Secondary | ICD-10-CM | POA: Diagnosis not present

## 2014-03-13 DIAGNOSIS — R609 Edema, unspecified: Secondary | ICD-10-CM

## 2014-03-13 DIAGNOSIS — R918 Other nonspecific abnormal finding of lung field: Secondary | ICD-10-CM

## 2014-03-13 DIAGNOSIS — R0602 Shortness of breath: Secondary | ICD-10-CM

## 2014-03-13 DIAGNOSIS — I214 Non-ST elevation (NSTEMI) myocardial infarction: Secondary | ICD-10-CM | POA: Diagnosis not present

## 2014-03-13 DIAGNOSIS — I4891 Unspecified atrial fibrillation: Secondary | ICD-10-CM | POA: Diagnosis not present

## 2014-03-13 DIAGNOSIS — I482 Chronic atrial fibrillation, unspecified: Secondary | ICD-10-CM

## 2014-03-13 DIAGNOSIS — E119 Type 2 diabetes mellitus without complications: Secondary | ICD-10-CM | POA: Diagnosis not present

## 2014-03-13 DIAGNOSIS — I5030 Unspecified diastolic (congestive) heart failure: Secondary | ICD-10-CM | POA: Diagnosis not present

## 2014-03-13 DIAGNOSIS — I359 Nonrheumatic aortic valve disorder, unspecified: Secondary | ICD-10-CM | POA: Diagnosis not present

## 2014-03-13 LAB — MDC_IDC_ENUM_SESS_TYPE_INCLINIC
Battery Voltage: 3.04 V
Brady Statistic RV Percent Paced: 7.9 %
Date Time Interrogation Session: 20150827145320
Implantable Pulse Generator Model: 3242
Lead Channel Pacing Threshold Amplitude: 0.5 V
Lead Channel Pacing Threshold Amplitude: 1.5 V
Lead Channel Pacing Threshold Pulse Width: 0.5 ms
Lead Channel Setting Pacing Amplitude: 2.5 V
Lead Channel Setting Pacing Amplitude: 2.5 V
Lead Channel Setting Pacing Pulse Width: 1.2 ms
MDC IDC MSMT BATTERY REMAINING LONGEVITY: 123.6 mo
MDC IDC MSMT LEADCHNL LV IMPEDANCE VALUE: 387.5 Ohm
MDC IDC MSMT LEADCHNL LV PACING THRESHOLD AMPLITUDE: 1.5 V
MDC IDC MSMT LEADCHNL LV PACING THRESHOLD PULSEWIDTH: 1.2 ms
MDC IDC MSMT LEADCHNL LV PACING THRESHOLD PULSEWIDTH: 1.2 ms
MDC IDC MSMT LEADCHNL RV IMPEDANCE VALUE: 512.5 Ohm
MDC IDC MSMT LEADCHNL RV PACING THRESHOLD AMPLITUDE: 0.5 V
MDC IDC MSMT LEADCHNL RV PACING THRESHOLD PULSEWIDTH: 0.5 ms
MDC IDC MSMT LEADCHNL RV SENSING INTR AMPL: 12 mV
MDC IDC PG SERIAL: 7594455
MDC IDC SET LEADCHNL RV PACING PULSEWIDTH: 0.5 ms
MDC IDC SET LEADCHNL RV SENSING SENSITIVITY: 2 mV
MDC IDC STAT BRADY RA PERCENT PACED: 0 %

## 2014-03-13 NOTE — Patient Instructions (Addendum)
Your physician has recommended you make the following change in your medication:  1) STOP Aspirin   A chest x-ray takes a picture of the organs and structures inside the chest, including the heart, lungs, and blood vessels. This test can show several things, including, whether the heart is enlarges; whether fluid is building up in the lungs; and whether pacemaker / defibrillator leads are still in place.  Please go to [facility] for chest xray   Your physician recommends that you schedule a follow-up appointment in: 2-3 weeks with Dr. Graciela Husbands.

## 2014-03-13 NOTE — Progress Notes (Signed)
ELECTROPHYSIOLOGY CONSULT NOTE  Patient ID: [name], MRN: [id number], DOB/AGE: May 09, 1927 80 y.o. Admit date: (Not on file) Date of Consult: 03/13/2014  Primary Physician: No primary provider on file. Primary Cardiologist: new  Chief Complaint: afib   HPI [name] is a 80 y.o. female  Referred following pacemaker-CRT P. implantation one month ago. She presented to an outside hospital with atrial fibrillation and a rapid rate. Efforts to place a pacemaker at the outside hospital had failed and the patient was transferred to [place]. She underwent an evaluation including an echo suggesting moderate-severe aortic stenosis. Catheterization demonstrated no obstruction and right heart catheterization suggested the aortic stenosis with only mild-moderate. Ejection fraction was recorded as 35-45%. She apparently also had a history of symptomatic bradycardia and following pacemaker implantation metoprolol was initiated and uptitrated.  She has long-standing severe dyspnea. She has 2-3 pillow orthopnea. She has not had significant peripheral edema.     Underwent pacemaker implantation CBC Jude device.  Old records were reviewed    Past Medical History  Diagnosis Date  . CHF (congestive heart failure)   . DM (diabetes mellitus)   . Dyslipidemia   . Thyroid goiter   . A-fib   . Osteoarthritis       Surgical History:  Past Surgical History  Procedure Laterality Date  . Papcemaker  01/2014  . Cardiac catheterization    . Abdominal hysterectomy       Home Meds: Prior to Admission medications   Medication Sig Start Date End Date Taking? Authorizing Provider  aspirin 81 MG tablet Take 81 mg by mouth daily.   Yes Historical Provider, MD  calcitRIOL (ROCALTROL) 0.25 MCG capsule Take 0.25 mcg by mouth daily.   Yes Historical Provider, MD  furosemide (LASIX) 20 MG tablet Take 20 mg by mouth 2 (two) times daily.    Yes Historical Provider, MD  lisinopril  (PRINIVIL,ZESTRIL) 20 MG tablet Take 20 mg by mouth daily.   Yes Historical Provider, MD  metoprolol (LOPRESSOR) 100 MG tablet Take 100 mg by mouth 2 (two) times daily.   Yes Historical Provider, MD  potassium chloride SA (K-DUR,KLOR-CON) 20 MEQ tablet Take 20 mEq by mouth daily. 1/2 tab daily   Yes Historical Provider, MD  rosuvastatin (CRESTOR) 10 MG tablet Take 10 mg by mouth daily.   Yes Historical Provider, MD  warfarin (COUMADIN) 5 MG tablet Take 5 mg by mouth daily.   Yes Historical Provider, MD      Allergies: No Known Allergies  History   Social History  . Marital Status: Widowed    Spouse Name: N/A    Number of Children: N/A  . Years of Education: N/A   Occupational History  . Not on file.   Social History Main Topics  . Smoking status: Never Smoker   . Smokeless tobacco: Not on file  . Alcohol Use: Not on file  . Drug Use: Not on file  . Sexual Activity: Not on file   Other Topics Concern  . Not on file   Social History Narrative  . No narrative on file     Family History  Problem Relation Age of Onset  . Hypertension Brother      ROS:  Please see the history of present illness.     All other systems reviewed and negative.    Physical Exam:    Blood pressure 134/90, pulse 95, height  (1.626 m), weight 146 lb 6.4 oz (66.407 kg). General: Well  developed, cachectic and very old [place] American  female in no acute distress. Head: Normocephalic, atraumatic, sclera non-icteric, no xanthomas, nares are without discharge. EENT: normal Lymph Nodes:  none Back: with marked  kyphosi s  no CVA tendersness Neck: Negative for carotid bruits. JVD not elevated. Lungs: Ronchi wtihout egophony s, rales, or rhonchi. Breathing is unlabored. Heart: Irregularly irregular rate with 2/6 murmur , S2 is preserved Abdomen: Soft, non-tender, non-distended with normoactive bowel sounds. No hepatomegaly. No rebound/guarding. No obvious abdominal masses. Msk:  Strength and  tone appear normal for age. Extremities: No clubbing or cyanosis. Tr  edema.  Distal pedal pulses are 2+ and equal bilaterally. Skin: Warm and Dry Neuro: Alert and oriented X 3. CN III-XII intact Grossly normal sensory and motor function . Psych:  Responds to questions appropriately with a normal affect.      Labs: Cardiac Enzymes No results found for this basename: CKTOTAL, CKMB, TROPONINI,  in the last 72 hours CBC Lab Results  Component Value Date   WBC 5.4 12/07/2008   HGB 12.4 12/07/2008   HCT 39.0 12/07/2008   MCV 91.0 12/07/2008   PLT 188 12/07/2008   PROTIME: No results found for this basename: LABPROT, INR,  in the last 72 hours Chemistry No results found for this basename: NA, K, CL, CO2, BUN, CREATININE, CALCIUM, LABALBU, PROT, BILITOT, ALKPHOS, ALT, AST, GLUCOSE,  in the last 168 hours Lipids No results found for this basename: CHOL, HDL, LDLCALC, TRIG   BNP No results found for this basename: probnp   Miscellaneous No results found for this basename: DDIMER    Radiology/Studies:  Dg Chest 2 View  02/18/2014   CLINICAL DATA:  Congestive heart failure. Pacemaker place 1-2 weeks ago. Short of breath and weakness.  EXAM: CHEST  2 VIEW  COMPARISON:  10/07/2005  FINDINGS: Cardiac silhouette is mildly enlarged and the aorta is mildly uncoiled. No mediastinal or hilar masses. There is right lung base opacity partly obscuring the hemidiaphragm. This is new from the prior study. Lungs are otherwise clear. No pleural effusion. No pneumothorax.  Left anterior chest wall pacemaker has 2 leads projecting in the left side of the heart, 1 in the right ventricle and the other most likely in the left ventricle.  Bony thorax is demineralized but grossly intact.  IMPRESSION: 1. New right lung base opacity. This could reflect pneumonia. Atelectasis could have this appearance. 2. No other evidence of acute cardiopulmonary disease. 3. Pacer leads project in the right ventricle and left ventricle.  No pneumothorax.   Electronically Signed   By: Amie Portland M.D.   On: 02/18/2014 15:11    EKG: AFib with a controlled ventricular response Assessment and Plan: Dyspnea  Congestive heart failure-chronic-systolic and diastolic  Atrial fibrillation-permanent with a rapid ventricular response  CRT-P. implantation of 8/15-[place]  Chest x-ray demonstrating prior right lung opacification  Her atrial fibrillation is somewhat improved rate 1 his vasomotor old tracings in the descriptions. Her metoprolol dose has been uptitrated. We will need to get new data prior to making any further decisions however  We'll get a chest x-ray to reevaluate the right lung issue her lungs by examination sound better than this.  We'll discontinue her aspirin    Sherryl Manges

## 2014-03-14 DIAGNOSIS — I214 Non-ST elevation (NSTEMI) myocardial infarction: Secondary | ICD-10-CM | POA: Diagnosis not present

## 2014-03-14 DIAGNOSIS — I509 Heart failure, unspecified: Secondary | ICD-10-CM | POA: Diagnosis not present

## 2014-03-14 DIAGNOSIS — I4891 Unspecified atrial fibrillation: Secondary | ICD-10-CM | POA: Diagnosis not present

## 2014-03-14 DIAGNOSIS — Z48812 Encounter for surgical aftercare following surgery on the circulatory system: Secondary | ICD-10-CM | POA: Diagnosis not present

## 2014-03-14 DIAGNOSIS — I359 Nonrheumatic aortic valve disorder, unspecified: Secondary | ICD-10-CM | POA: Diagnosis not present

## 2014-03-14 DIAGNOSIS — I5030 Unspecified diastolic (congestive) heart failure: Secondary | ICD-10-CM | POA: Diagnosis not present

## 2014-03-17 DIAGNOSIS — I5030 Unspecified diastolic (congestive) heart failure: Secondary | ICD-10-CM | POA: Diagnosis not present

## 2014-03-17 DIAGNOSIS — Z48812 Encounter for surgical aftercare following surgery on the circulatory system: Secondary | ICD-10-CM | POA: Diagnosis not present

## 2014-03-17 DIAGNOSIS — I4891 Unspecified atrial fibrillation: Secondary | ICD-10-CM | POA: Diagnosis not present

## 2014-03-17 DIAGNOSIS — I509 Heart failure, unspecified: Secondary | ICD-10-CM | POA: Diagnosis not present

## 2014-03-17 DIAGNOSIS — I214 Non-ST elevation (NSTEMI) myocardial infarction: Secondary | ICD-10-CM | POA: Diagnosis not present

## 2014-03-17 DIAGNOSIS — I359 Nonrheumatic aortic valve disorder, unspecified: Secondary | ICD-10-CM | POA: Diagnosis not present

## 2014-03-18 ENCOUNTER — Other Ambulatory Visit: Payer: Self-pay

## 2014-03-18 DIAGNOSIS — I509 Heart failure, unspecified: Secondary | ICD-10-CM | POA: Diagnosis not present

## 2014-03-18 DIAGNOSIS — I359 Nonrheumatic aortic valve disorder, unspecified: Secondary | ICD-10-CM | POA: Diagnosis not present

## 2014-03-18 DIAGNOSIS — I5030 Unspecified diastolic (congestive) heart failure: Secondary | ICD-10-CM | POA: Diagnosis not present

## 2014-03-18 DIAGNOSIS — I4891 Unspecified atrial fibrillation: Secondary | ICD-10-CM | POA: Diagnosis not present

## 2014-03-18 DIAGNOSIS — I214 Non-ST elevation (NSTEMI) myocardial infarction: Secondary | ICD-10-CM | POA: Diagnosis not present

## 2014-03-18 DIAGNOSIS — Z48812 Encounter for surgical aftercare following surgery on the circulatory system: Secondary | ICD-10-CM | POA: Diagnosis not present

## 2014-03-18 MED ORDER — CALCITRIOL 0.25 MCG PO CAPS: 0.2500 ug | ORAL_CAPSULE | Freq: Every day | ORAL | Status: DC

## 2014-03-20 DIAGNOSIS — I359 Nonrheumatic aortic valve disorder, unspecified: Secondary | ICD-10-CM | POA: Diagnosis not present

## 2014-03-20 DIAGNOSIS — I4891 Unspecified atrial fibrillation: Secondary | ICD-10-CM | POA: Diagnosis not present

## 2014-03-20 DIAGNOSIS — I509 Heart failure, unspecified: Secondary | ICD-10-CM | POA: Diagnosis not present

## 2014-03-20 DIAGNOSIS — Z48812 Encounter for surgical aftercare following surgery on the circulatory system: Secondary | ICD-10-CM | POA: Diagnosis not present

## 2014-03-20 DIAGNOSIS — I214 Non-ST elevation (NSTEMI) myocardial infarction: Secondary | ICD-10-CM | POA: Diagnosis not present

## 2014-03-20 DIAGNOSIS — I5030 Unspecified diastolic (congestive) heart failure: Secondary | ICD-10-CM | POA: Diagnosis not present

## 2014-03-21 DIAGNOSIS — Z48812 Encounter for surgical aftercare following surgery on the circulatory system: Secondary | ICD-10-CM | POA: Diagnosis not present

## 2014-03-21 DIAGNOSIS — I4891 Unspecified atrial fibrillation: Secondary | ICD-10-CM | POA: Diagnosis not present

## 2014-03-21 DIAGNOSIS — I214 Non-ST elevation (NSTEMI) myocardial infarction: Secondary | ICD-10-CM | POA: Diagnosis not present

## 2014-03-21 DIAGNOSIS — I509 Heart failure, unspecified: Secondary | ICD-10-CM | POA: Diagnosis not present

## 2014-03-21 DIAGNOSIS — I5030 Unspecified diastolic (congestive) heart failure: Secondary | ICD-10-CM | POA: Diagnosis not present

## 2014-03-21 DIAGNOSIS — I359 Nonrheumatic aortic valve disorder, unspecified: Secondary | ICD-10-CM | POA: Diagnosis not present

## 2014-03-25 ENCOUNTER — Encounter: Payer: Self-pay | Admitting: Internal Medicine

## 2014-03-25 DIAGNOSIS — I359 Nonrheumatic aortic valve disorder, unspecified: Secondary | ICD-10-CM | POA: Diagnosis not present

## 2014-03-25 DIAGNOSIS — I10 Essential (primary) hypertension: Secondary | ICD-10-CM | POA: Diagnosis not present

## 2014-03-25 DIAGNOSIS — I5030 Unspecified diastolic (congestive) heart failure: Secondary | ICD-10-CM | POA: Diagnosis not present

## 2014-03-25 DIAGNOSIS — Z48812 Encounter for surgical aftercare following surgery on the circulatory system: Secondary | ICD-10-CM | POA: Diagnosis not present

## 2014-03-25 DIAGNOSIS — Z1331 Encounter for screening for depression: Secondary | ICD-10-CM | POA: Diagnosis not present

## 2014-03-25 DIAGNOSIS — I4891 Unspecified atrial fibrillation: Secondary | ICD-10-CM | POA: Diagnosis not present

## 2014-03-25 DIAGNOSIS — I214 Non-ST elevation (NSTEMI) myocardial infarction: Secondary | ICD-10-CM | POA: Diagnosis not present

## 2014-03-25 DIAGNOSIS — I509 Heart failure, unspecified: Secondary | ICD-10-CM | POA: Diagnosis not present

## 2014-03-27 ENCOUNTER — Telehealth: Payer: Self-pay | Admitting: Internal Medicine

## 2014-03-27 ENCOUNTER — Encounter: Payer: Self-pay | Admitting: *Deleted

## 2014-03-27 ENCOUNTER — Encounter: Payer: Self-pay | Admitting: Internal Medicine

## 2014-03-27 ENCOUNTER — Ambulatory Visit (INDEPENDENT_AMBULATORY_CARE_PROVIDER_SITE_OTHER): Payer: Medicare Other | Admitting: Internal Medicine

## 2014-03-27 VITALS — BP 120/72 | HR 96 | Ht 64.0 in | Wt 150.6 lb

## 2014-03-27 DIAGNOSIS — I495 Sick sinus syndrome: Secondary | ICD-10-CM

## 2014-03-27 DIAGNOSIS — Z950 Presence of cardiac pacemaker: Secondary | ICD-10-CM | POA: Diagnosis not present

## 2014-03-27 DIAGNOSIS — I4891 Unspecified atrial fibrillation: Secondary | ICD-10-CM

## 2014-03-27 LAB — MDC_IDC_ENUM_SESS_TYPE_INCLINIC
Date Time Interrogation Session: 20150910133230
Implantable Pulse Generator Serial Number: 7594455
Lead Channel Pacing Threshold Amplitude: 0.5 V
Lead Channel Pacing Threshold Amplitude: 0.5 V
Lead Channel Pacing Threshold Amplitude: 1.25 V
Lead Channel Pacing Threshold Amplitude: 1.25 V
Lead Channel Pacing Threshold Pulse Width: 0.5 ms
Lead Channel Pacing Threshold Pulse Width: 0.5 ms
Lead Channel Pacing Threshold Pulse Width: 1.2 ms
Lead Channel Pacing Threshold Pulse Width: 1.2 ms
Lead Channel Setting Pacing Amplitude: 2.25 V
Lead Channel Setting Sensing Sensitivity: 2 mV
MDC IDC MSMT BATTERY REMAINING LONGEVITY: 122.4 mo
MDC IDC MSMT BATTERY VOLTAGE: 3.04 V
MDC IDC MSMT LEADCHNL LV IMPEDANCE VALUE: 412.5 Ohm
MDC IDC MSMT LEADCHNL RV IMPEDANCE VALUE: 550 Ohm
MDC IDC MSMT LEADCHNL RV SENSING INTR AMPL: 12 mV
MDC IDC PG MODEL: 3242
MDC IDC SET LEADCHNL LV PACING PULSEWIDTH: 1.2 ms
MDC IDC SET LEADCHNL RV PACING AMPLITUDE: 2.5 V
MDC IDC SET LEADCHNL RV PACING PULSEWIDTH: 0.5 ms
MDC IDC STAT BRADY RA PERCENT PACED: 0 %
MDC IDC STAT BRADY RV PERCENT PACED: 11 %

## 2014-03-27 MED ORDER — FUROSEMIDE 20 MG PO TABS: 20.0000 mg | ORAL_TABLET | Freq: Every day | ORAL | Status: DC

## 2014-03-27 NOTE — Progress Notes (Signed)
      Patient has no care team.   HPI  [name] is a 80 y.o. female Seen in followup following a recent pacemaker implanted in [place] at which time she received a CRT P. She has atrial fibrillation which is permanent associated with a rapid response. At her hospitalization in [place] she was noted to have an opacified right lung; repeat chest x-ray here demonstrated aeration.  We increased her beta blocker to see if we can improve heart rate control with the idea being that AV junction ablation might be necessary  Ejection fraction of her to 40-45%   She is much improved. There is much less shortness of breath. There is no edema. sHe is able to walk 3 or 4 houses down the street and back. She is able to climb and descend stairs.   Past Medical History  Diagnosis Date  . CHF (congestive heart failure)   . DM (diabetes mellitus)   . Dyslipidemia   . Thyroid goiter   . A-fib   . Osteoarthritis     Past Surgical History  Procedure Laterality Date  . Papcemaker  01/2014  . Cardiac catheterization    . Abdominal hysterectomy      Current Outpatient Prescriptions  Medication Sig Dispense Refill  . calcitRIOL (ROCALTROL) 0.25 MCG capsule Take 1 capsule (0.25 mcg total) by mouth daily.  30 capsule  6  . lisinopril (PRINIVIL,ZESTRIL) 20 MG tablet Take 20 mg by mouth daily.      . metoprolol (TOPROL-XL) 200 MG 24 hr tablet Take 200 mg by mouth daily.      . potassium chloride SA (K-DUR,KLOR-CON) 20 MEQ tablet Take 20 mEq by mouth daily. 1/2 tab daily      . rosuvastatin (CRESTOR) 10 MG tablet Take 10 mg by mouth daily.      Marland Kitchen warfarin (COUMADIN) 5 MG tablet Take 5 mg by mouth daily.       No current facility-administered medications for this visit.    No Known Allergies  Review of Systems negative except from HPI and PMH  Physical Exam BP 120/72  Pulse 96  Ht 5\' 4"  (1.626 m)  Wt 150 lb 9.6 oz (68.312 kg)  BMI 25.84 kg/m2 Well developed and well nourished in  no acute distress HENT normal E scleral and icterus clear Neck Supple JVP flat;  Clear to ausculation  Regular rate and rhythm, no murmurs gallops or rub Soft with active bowel sounds No clubbing cyanosis  Edema Alert and oriented, grossly normal motor and sensory function Skin Warm and Dry    Assessment and  Plan  HFpEF  Atrial fibrillation-permanent  Pacemaker-CRT-St. Jude  She is doing much better. She is significantly less dyspnea. A percent of her beats are faster than 100 beats per minute. It is not clear as to what metoprolol preparation she is taking. They will call as the latter snow whether to succinate or tartrate and we'll adjust medications accordingly.  And she is doing so much better, we'll hold off on any further interventions. With her ejection fraction of 40-45 to degree inclination would be to add Cardizem as next therapy rather than proceed with AV junction ablation.

## 2014-03-27 NOTE — Patient Instructions (Addendum)
Your physician has recommended you make the following change in your medication:  1) DECREASE Furosemide to 20 mg daily   Please call us and let us know which Metoprolol you are taking -- Horace [name] [phone]  Your physician recommends that you schedule a follow-up appointment in: 10-12 weeks with Dr. Graciela Husbands.

## 2014-03-27 NOTE — Progress Notes (Signed)
Patient's dtr called [place] to confirm patient is taking Toprol-XL 200 mg daily.

## 2014-03-28 DIAGNOSIS — I82409 Acute embolism and thrombosis of unspecified deep veins of unspecified lower extremity: Secondary | ICD-10-CM | POA: Diagnosis not present

## 2014-03-28 DIAGNOSIS — I4891 Unspecified atrial fibrillation: Secondary | ICD-10-CM | POA: Diagnosis not present

## 2014-03-31 ENCOUNTER — Encounter: Payer: Self-pay | Admitting: Internal Medicine

## 2014-04-01 DIAGNOSIS — I5030 Unspecified diastolic (congestive) heart failure: Secondary | ICD-10-CM | POA: Diagnosis not present

## 2014-04-01 DIAGNOSIS — I509 Heart failure, unspecified: Secondary | ICD-10-CM | POA: Diagnosis not present

## 2014-04-01 DIAGNOSIS — I214 Non-ST elevation (NSTEMI) myocardial infarction: Secondary | ICD-10-CM | POA: Diagnosis not present

## 2014-04-01 DIAGNOSIS — I359 Nonrheumatic aortic valve disorder, unspecified: Secondary | ICD-10-CM | POA: Diagnosis not present

## 2014-04-01 DIAGNOSIS — I4891 Unspecified atrial fibrillation: Secondary | ICD-10-CM | POA: Diagnosis not present

## 2014-04-01 DIAGNOSIS — Z48812 Encounter for surgical aftercare following surgery on the circulatory system: Secondary | ICD-10-CM | POA: Diagnosis not present

## 2014-04-02 ENCOUNTER — Telehealth: Payer: Self-pay | Admitting: *Deleted

## 2014-04-02 DIAGNOSIS — I359 Nonrheumatic aortic valve disorder, unspecified: Secondary | ICD-10-CM | POA: Diagnosis not present

## 2014-04-02 DIAGNOSIS — I509 Heart failure, unspecified: Secondary | ICD-10-CM | POA: Diagnosis not present

## 2014-04-02 DIAGNOSIS — Z48812 Encounter for surgical aftercare following surgery on the circulatory system: Secondary | ICD-10-CM | POA: Diagnosis not present

## 2014-04-02 DIAGNOSIS — I5030 Unspecified diastolic (congestive) heart failure: Secondary | ICD-10-CM | POA: Diagnosis not present

## 2014-04-02 DIAGNOSIS — I214 Non-ST elevation (NSTEMI) myocardial infarction: Secondary | ICD-10-CM | POA: Diagnosis not present

## 2014-04-02 DIAGNOSIS — I4891 Unspecified atrial fibrillation: Secondary | ICD-10-CM | POA: Diagnosis not present

## 2014-04-02 MED ORDER — FUROSEMIDE 20 MG PO TABS: 20.0000 mg | ORAL_TABLET | Freq: Two times a day (BID) | ORAL | Status: DC

## 2014-04-02 NOTE — Telephone Encounter (Signed)
[facility] nurse called and Dr. Mariah Milling decreased Shanette's  lasix's and she is now having sob. Please call nurse.

## 2014-04-02 NOTE — Telephone Encounter (Signed)
Reviewed with DOD, Dr. Elease Hashimoto. Called patient's home number and left detailed message. Called pt's dtr and spoke with her (she was with pt at last OV) - advised to increase Lasix to BID. She verbalized understanding.

## 2014-04-02 NOTE — Telephone Encounter (Signed)
Follow up          Forwarding original message to Dr Odessa Fleming Nurse

## 2014-04-02 NOTE — Telephone Encounter (Signed)
Pt has not seen Dr. Mariah Milling. She is a pt of Dr. Graciela Husbands.

## 2014-04-03 DIAGNOSIS — R350 Frequency of micturition: Secondary | ICD-10-CM | POA: Diagnosis not present

## 2014-04-09 DIAGNOSIS — I359 Nonrheumatic aortic valve disorder, unspecified: Secondary | ICD-10-CM | POA: Diagnosis not present

## 2014-04-09 DIAGNOSIS — Z48812 Encounter for surgical aftercare following surgery on the circulatory system: Secondary | ICD-10-CM | POA: Diagnosis not present

## 2014-04-09 DIAGNOSIS — I5030 Unspecified diastolic (congestive) heart failure: Secondary | ICD-10-CM | POA: Diagnosis not present

## 2014-04-09 DIAGNOSIS — I214 Non-ST elevation (NSTEMI) myocardial infarction: Secondary | ICD-10-CM | POA: Diagnosis not present

## 2014-04-09 DIAGNOSIS — I4891 Unspecified atrial fibrillation: Secondary | ICD-10-CM | POA: Diagnosis not present

## 2014-04-09 DIAGNOSIS — I509 Heart failure, unspecified: Secondary | ICD-10-CM | POA: Diagnosis not present

## 2014-04-12 DIAGNOSIS — I5030 Unspecified diastolic (congestive) heart failure: Secondary | ICD-10-CM | POA: Diagnosis not present

## 2014-04-12 DIAGNOSIS — I509 Heart failure, unspecified: Secondary | ICD-10-CM | POA: Diagnosis not present

## 2014-04-12 DIAGNOSIS — I4891 Unspecified atrial fibrillation: Secondary | ICD-10-CM | POA: Diagnosis not present

## 2014-04-12 DIAGNOSIS — I129 Hypertensive chronic kidney disease with stage 1 through stage 4 chronic kidney disease, or unspecified chronic kidney disease: Secondary | ICD-10-CM | POA: Diagnosis not present

## 2014-04-12 DIAGNOSIS — J449 Chronic obstructive pulmonary disease, unspecified: Secondary | ICD-10-CM | POA: Diagnosis not present

## 2014-04-12 DIAGNOSIS — N183 Chronic kidney disease, stage 3 (moderate): Secondary | ICD-10-CM | POA: Diagnosis not present

## 2014-04-14 DIAGNOSIS — I4891 Unspecified atrial fibrillation: Secondary | ICD-10-CM | POA: Diagnosis not present

## 2014-04-16 DIAGNOSIS — N183 Chronic kidney disease, stage 3 (moderate): Secondary | ICD-10-CM | POA: Diagnosis not present

## 2014-04-16 DIAGNOSIS — I5030 Unspecified diastolic (congestive) heart failure: Secondary | ICD-10-CM | POA: Diagnosis not present

## 2014-04-16 DIAGNOSIS — I129 Hypertensive chronic kidney disease with stage 1 through stage 4 chronic kidney disease, or unspecified chronic kidney disease: Secondary | ICD-10-CM | POA: Diagnosis not present

## 2014-04-16 DIAGNOSIS — J449 Chronic obstructive pulmonary disease, unspecified: Secondary | ICD-10-CM | POA: Diagnosis not present

## 2014-04-16 DIAGNOSIS — I4891 Unspecified atrial fibrillation: Secondary | ICD-10-CM | POA: Diagnosis not present

## 2014-04-16 DIAGNOSIS — I509 Heart failure, unspecified: Secondary | ICD-10-CM | POA: Diagnosis not present

## 2014-04-29 DIAGNOSIS — I4891 Unspecified atrial fibrillation: Secondary | ICD-10-CM | POA: Diagnosis not present

## 2014-04-30 ENCOUNTER — Encounter: Payer: Self-pay | Admitting: Internal Medicine

## 2014-05-06 DIAGNOSIS — J449 Chronic obstructive pulmonary disease, unspecified: Secondary | ICD-10-CM | POA: Diagnosis not present

## 2014-05-06 DIAGNOSIS — I509 Heart failure, unspecified: Secondary | ICD-10-CM | POA: Diagnosis not present

## 2014-05-06 DIAGNOSIS — I4891 Unspecified atrial fibrillation: Secondary | ICD-10-CM | POA: Diagnosis not present

## 2014-05-06 DIAGNOSIS — N183 Chronic kidney disease, stage 3 (moderate): Secondary | ICD-10-CM | POA: Diagnosis not present

## 2014-05-06 DIAGNOSIS — I5030 Unspecified diastolic (congestive) heart failure: Secondary | ICD-10-CM | POA: Diagnosis not present

## 2014-05-06 DIAGNOSIS — I129 Hypertensive chronic kidney disease with stage 1 through stage 4 chronic kidney disease, or unspecified chronic kidney disease: Secondary | ICD-10-CM | POA: Diagnosis not present

## 2014-05-09 DIAGNOSIS — I4891 Unspecified atrial fibrillation: Secondary | ICD-10-CM | POA: Diagnosis not present

## 2014-05-15 ENCOUNTER — Encounter: Payer: Self-pay | Admitting: Internal Medicine

## 2014-05-20 DIAGNOSIS — Z23 Encounter for immunization: Secondary | ICD-10-CM | POA: Diagnosis not present

## 2014-06-02 DIAGNOSIS — I129 Hypertensive chronic kidney disease with stage 1 through stage 4 chronic kidney disease, or unspecified chronic kidney disease: Secondary | ICD-10-CM | POA: Diagnosis not present

## 2014-06-02 DIAGNOSIS — I509 Heart failure, unspecified: Secondary | ICD-10-CM | POA: Diagnosis not present

## 2014-06-02 DIAGNOSIS — N183 Chronic kidney disease, stage 3 (moderate): Secondary | ICD-10-CM | POA: Diagnosis not present

## 2014-06-02 DIAGNOSIS — J449 Chronic obstructive pulmonary disease, unspecified: Secondary | ICD-10-CM | POA: Diagnosis not present

## 2014-06-02 DIAGNOSIS — I5030 Unspecified diastolic (congestive) heart failure: Secondary | ICD-10-CM | POA: Diagnosis not present

## 2014-06-02 DIAGNOSIS — I4891 Unspecified atrial fibrillation: Secondary | ICD-10-CM | POA: Diagnosis not present

## 2014-06-09 DIAGNOSIS — I129 Hypertensive chronic kidney disease with stage 1 through stage 4 chronic kidney disease, or unspecified chronic kidney disease: Secondary | ICD-10-CM | POA: Diagnosis not present

## 2014-06-09 DIAGNOSIS — I4891 Unspecified atrial fibrillation: Secondary | ICD-10-CM | POA: Diagnosis not present

## 2014-06-09 DIAGNOSIS — I5030 Unspecified diastolic (congestive) heart failure: Secondary | ICD-10-CM | POA: Diagnosis not present

## 2014-06-09 DIAGNOSIS — I509 Heart failure, unspecified: Secondary | ICD-10-CM | POA: Diagnosis not present

## 2014-06-09 DIAGNOSIS — J449 Chronic obstructive pulmonary disease, unspecified: Secondary | ICD-10-CM | POA: Diagnosis not present

## 2014-06-09 DIAGNOSIS — N183 Chronic kidney disease, stage 3 (moderate): Secondary | ICD-10-CM | POA: Diagnosis not present

## 2014-06-11 ENCOUNTER — Encounter: Payer: Self-pay | Admitting: Internal Medicine

## 2014-06-11 DIAGNOSIS — I4891 Unspecified atrial fibrillation: Secondary | ICD-10-CM | POA: Diagnosis not present

## 2014-06-11 DIAGNOSIS — N183 Chronic kidney disease, stage 3 (moderate): Secondary | ICD-10-CM | POA: Diagnosis not present

## 2014-06-11 DIAGNOSIS — I5030 Unspecified diastolic (congestive) heart failure: Secondary | ICD-10-CM | POA: Diagnosis not present

## 2014-06-11 DIAGNOSIS — J449 Chronic obstructive pulmonary disease, unspecified: Secondary | ICD-10-CM | POA: Diagnosis not present

## 2014-06-11 DIAGNOSIS — I129 Hypertensive chronic kidney disease with stage 1 through stage 4 chronic kidney disease, or unspecified chronic kidney disease: Secondary | ICD-10-CM | POA: Diagnosis not present

## 2014-06-11 DIAGNOSIS — I509 Heart failure, unspecified: Secondary | ICD-10-CM | POA: Diagnosis not present

## 2014-06-17 DIAGNOSIS — I4891 Unspecified atrial fibrillation: Secondary | ICD-10-CM | POA: Diagnosis not present

## 2014-06-19 DIAGNOSIS — I129 Hypertensive chronic kidney disease with stage 1 through stage 4 chronic kidney disease, or unspecified chronic kidney disease: Secondary | ICD-10-CM | POA: Diagnosis not present

## 2014-06-19 DIAGNOSIS — I4891 Unspecified atrial fibrillation: Secondary | ICD-10-CM | POA: Diagnosis not present

## 2014-06-19 DIAGNOSIS — N183 Chronic kidney disease, stage 3 (moderate): Secondary | ICD-10-CM | POA: Diagnosis not present

## 2014-06-19 DIAGNOSIS — I509 Heart failure, unspecified: Secondary | ICD-10-CM | POA: Diagnosis not present

## 2014-06-19 DIAGNOSIS — I5030 Unspecified diastolic (congestive) heart failure: Secondary | ICD-10-CM | POA: Diagnosis not present

## 2014-06-19 DIAGNOSIS — J449 Chronic obstructive pulmonary disease, unspecified: Secondary | ICD-10-CM | POA: Diagnosis not present

## 2014-06-25 ENCOUNTER — Encounter: Payer: Self-pay | Admitting: Internal Medicine

## 2014-06-25 ENCOUNTER — Ambulatory Visit (INDEPENDENT_AMBULATORY_CARE_PROVIDER_SITE_OTHER): Payer: Medicare Other | Admitting: Internal Medicine

## 2014-06-25 VITALS — BP 122/86 | HR 95 | Ht 64.0 in | Wt 154.0 lb

## 2014-06-25 DIAGNOSIS — I482 Chronic atrial fibrillation, unspecified: Secondary | ICD-10-CM

## 2014-06-25 DIAGNOSIS — I495 Sick sinus syndrome: Secondary | ICD-10-CM | POA: Diagnosis not present

## 2014-06-25 DIAGNOSIS — Z45018 Encounter for adjustment and management of other part of cardiac pacemaker: Secondary | ICD-10-CM

## 2014-06-25 LAB — MDC_IDC_ENUM_SESS_TYPE_INCLINIC
Brady Statistic RA Percent Paced: 0 %
Brady Statistic RV Percent Paced: 13 %
Date Time Interrogation Session: 20151209203425
Implantable Pulse Generator Model: 3242
Implantable Pulse Generator Serial Number: 7594455
Lead Channel Impedance Value: 387.5 Ohm
Lead Channel Pacing Threshold Amplitude: 0.5 V
Lead Channel Pacing Threshold Pulse Width: 1.2 ms
Lead Channel Setting Pacing Amplitude: 2.25 V
Lead Channel Setting Pacing Pulse Width: 1.2 ms
MDC IDC MSMT BATTERY REMAINING LONGEVITY: 120 mo
MDC IDC MSMT BATTERY VOLTAGE: 3.02 V
MDC IDC MSMT LEADCHNL LV PACING THRESHOLD AMPLITUDE: 1 V
MDC IDC MSMT LEADCHNL RV IMPEDANCE VALUE: 562.5 Ohm
MDC IDC MSMT LEADCHNL RV PACING THRESHOLD PULSEWIDTH: 0.5 ms
MDC IDC MSMT LEADCHNL RV SENSING INTR AMPL: 12 mV
MDC IDC SET LEADCHNL RV PACING AMPLITUDE: 2.5 V
MDC IDC SET LEADCHNL RV PACING PULSEWIDTH: 0.5 ms
MDC IDC SET LEADCHNL RV SENSING SENSITIVITY: 2 mV

## 2014-06-25 MED ORDER — DIGOXIN 125 MCG PO TABS: 0.1250 mg | ORAL_TABLET | Freq: Every day | ORAL | Status: DC

## 2014-06-25 MED ORDER — DILTIAZEM HCL ER COATED BEADS 120 MG PO CP24: 120.0000 mg | ORAL_CAPSULE | Freq: Every day | ORAL | Status: DC

## 2014-06-25 MED ORDER — METOPROLOL SUCCINATE ER 100 MG PO TB24: 100.0000 mg | ORAL_TABLET | Freq: Every day | ORAL | Status: DC

## 2014-06-25 NOTE — Patient Instructions (Addendum)
Your physician has recommended you make the following change in your medication:  1) START Digoxin 0.125 mg daily 2) START Diltiazem 120 mg daily 3) DECREASE Metoprolol  Your physician recommends that you return for lab work in: 2 weeks for Digoxin level and BMET  Remote monitoring is used to monitor your Pacemaker of ICD from home. This monitoring reduces the number of office visits required to check your device to one time per year. It allows [place] to keep an eye on the functioning of your device to ensure it is working properly. You are scheduled for a device check from home on 09/24/14. You may send your transmission at any time that day. If you have a wireless device, the transmission will be sent automatically. After your physician reviews your transmission, you will receive a postcard with your next transmission date.  Your physician recommends that you schedule a follow-up appointment in: 10-12 weeks with Dr. Graciela Husbands.

## 2014-06-25 NOTE — Progress Notes (Signed)
No care team member to display   HPI  [name] is a 80 y.o. female Seen in followup following a recent pacemaker implanted in [place] at which time she received a CRT P. She has atrial fibrillation which is permanent associated with a rapid response. At her hospitalization in [place] she was noted to have an opacified right lung; repeat chest x-ray here demonstrated aeration.  We increased her beta blocker to see if we can improve heart rate control with the idea being that AV junction ablation might be necessary  Ejection fraction of her to 40-45%   Previous improvement in exercise tolerance seemed to have been lost a little bit previous improvement in exercise tolerance seemed to have been lost. She is short of breath walking. She has not been up and walking and she had done previously I wonder whether the metoprolol is also contributing to her fatigue and so we will back down on.    Past Medical History  Diagnosis Date  . CHF (congestive heart failure)   . DM (diabetes mellitus)   . Dyslipidemia   . Thyroid goiter   . A-fib   . Osteoarthritis     Past Surgical History  Procedure Laterality Date  . Papcemaker  01/2014  . Cardiac catheterization    . Abdominal hysterectomy      Current Outpatient Prescriptions  Medication Sig Dispense Refill  . calcitRIOL (ROCALTROL) 0.25 MCG capsule Take 1 capsule (0.25 mcg total) by mouth daily. 30 capsule 6  . furosemide (LASIX) 20 MG tablet Take 1 tablet (20 mg total) by mouth 2 (two) times daily. 180 tablet 3  . lisinopril (PRINIVIL,ZESTRIL) 20 MG tablet Take 20 mg by mouth daily.    . metoprolol (TOPROL-XL) 200 MG 24 hr tablet Take 200 mg by mouth daily.    . potassium chloride SA (K-DUR,KLOR-CON) 20 MEQ tablet Take 20 mEq by mouth daily. 1/2 tab daily    . rosuvastatin (CRESTOR) 10 MG tablet Take 10 mg by mouth daily.    Marland Kitchen warfarin (COUMADIN) 5 MG tablet Take 5 mg by mouth daily.     No current  facility-administered medications for this visit.    No Known Allergies  Review of Systems negative except from HPI and PMH  Physical Exam BP 122/86 mmHg  Pulse 95  Ht 5\' 4"  (1.626 m)  Wt 154 lb (69.854 kg)  BMI 26.42 kg/m2 Well developed and well nourished in no acute distress HENT normal E scleral and icterus clear Neck Supple JVP 9-10  Clear to ausculation  Regular rate and rhythm, no murmurs gallops or rub Soft with active bowel sounds No clubbing cyanosis  Tr Edema Alert and oriented, grossly normal motor and sensory function  Walks with walker Skin Warm and Dry    Assessment and  Plan  HFpEF  Atrial fibrillation-permanent  Pacemaker-CRT-St. Jude  35% of her heart beats are recorded as faster than  90 bpm We will increase rate control by adding low-dose digoxin 0.125 mg every other day and low-dose diltiazem 120 mg daily I should note that her creatinine in July demonstrated a GFR of greater than 60.  We have also discussed the next step would to be AV junction ablation in the event that her symptoms persist despite augmented rate control. I wonder whether the beta blockers contributed to her fatigue. We will decrease the dose from 200--100 mg a day as we do the aforementioned increases.  She'll also need a metabolic  profile level in about 2 weeks

## 2014-06-26 ENCOUNTER — Encounter: Payer: Medicare Other | Admitting: Internal Medicine

## 2014-07-04 DIAGNOSIS — I4891 Unspecified atrial fibrillation: Secondary | ICD-10-CM | POA: Diagnosis not present

## 2014-07-04 DIAGNOSIS — J449 Chronic obstructive pulmonary disease, unspecified: Secondary | ICD-10-CM | POA: Diagnosis not present

## 2014-07-04 DIAGNOSIS — I129 Hypertensive chronic kidney disease with stage 1 through stage 4 chronic kidney disease, or unspecified chronic kidney disease: Secondary | ICD-10-CM | POA: Diagnosis not present

## 2014-07-04 DIAGNOSIS — N183 Chronic kidney disease, stage 3 (moderate): Secondary | ICD-10-CM | POA: Diagnosis not present

## 2014-07-04 DIAGNOSIS — I5030 Unspecified diastolic (congestive) heart failure: Secondary | ICD-10-CM | POA: Diagnosis not present

## 2014-07-04 DIAGNOSIS — I509 Heart failure, unspecified: Secondary | ICD-10-CM | POA: Diagnosis not present

## 2014-07-08 ENCOUNTER — Other Ambulatory Visit (INDEPENDENT_AMBULATORY_CARE_PROVIDER_SITE_OTHER): Payer: Medicare Other | Admitting: *Deleted

## 2014-07-08 DIAGNOSIS — I482 Chronic atrial fibrillation, unspecified: Secondary | ICD-10-CM

## 2014-07-08 LAB — BASIC METABOLIC PANEL
BUN: 24 mg/dL — ABNORMAL HIGH (ref 6–23)
CHLORIDE: 105 meq/L (ref 96–112)
CO2: 27 mEq/L (ref 19–32)
CREATININE: 1.3 mg/dL — AB (ref 0.4–1.2)
Calcium: 10.3 mg/dL (ref 8.4–10.5)
GFR: 52.27 mL/min — ABNORMAL LOW (ref >60.00)
Glucose, Bld: 190 mg/dL — ABNORMAL HIGH (ref 70–99)
Potassium: 3.6 mEq/L (ref 3.5–5.1)
Sodium: 139 mEq/L (ref 135–145)

## 2014-07-15 DIAGNOSIS — I4891 Unspecified atrial fibrillation: Secondary | ICD-10-CM | POA: Diagnosis not present

## 2014-07-16 ENCOUNTER — Telehealth: Payer: Self-pay | Admitting: Internal Medicine

## 2014-07-16 NOTE — Telephone Encounter (Signed)
Patients daughter informed

## 2014-07-16 NOTE — Telephone Encounter (Signed)
New Msg       Pt daughter returning calling for pt. States to contact Hattie her sister to discuss mother's lab results. [phone].

## 2014-07-22 ENCOUNTER — Other Ambulatory Visit: Payer: Self-pay | Admitting: *Deleted

## 2014-07-22 MED ORDER — FUROSEMIDE 20 MG PO TABS: 20.0000 mg | ORAL_TABLET | Freq: Two times a day (BID) | ORAL | Status: DC

## 2014-07-25 DIAGNOSIS — I4891 Unspecified atrial fibrillation: Secondary | ICD-10-CM | POA: Diagnosis not present

## 2014-07-25 DIAGNOSIS — I509 Heart failure, unspecified: Secondary | ICD-10-CM | POA: Diagnosis not present

## 2014-07-25 DIAGNOSIS — N183 Chronic kidney disease, stage 3 (moderate): Secondary | ICD-10-CM | POA: Diagnosis not present

## 2014-07-25 DIAGNOSIS — J449 Chronic obstructive pulmonary disease, unspecified: Secondary | ICD-10-CM | POA: Diagnosis not present

## 2014-07-25 DIAGNOSIS — I5030 Unspecified diastolic (congestive) heart failure: Secondary | ICD-10-CM | POA: Diagnosis not present

## 2014-07-25 DIAGNOSIS — I129 Hypertensive chronic kidney disease with stage 1 through stage 4 chronic kidney disease, or unspecified chronic kidney disease: Secondary | ICD-10-CM | POA: Diagnosis not present

## 2014-07-30 DIAGNOSIS — I4891 Unspecified atrial fibrillation: Secondary | ICD-10-CM | POA: Diagnosis not present

## 2014-08-01 DIAGNOSIS — E1140 Type 2 diabetes mellitus with diabetic neuropathy, unspecified: Secondary | ICD-10-CM | POA: Diagnosis not present

## 2014-08-01 DIAGNOSIS — Z23 Encounter for immunization: Secondary | ICD-10-CM | POA: Diagnosis not present

## 2014-08-04 ENCOUNTER — Emergency Department (HOSPITAL_COMMUNITY)
Admission: EM | Admit: 2014-08-04 | Discharge: 2014-08-04 | Disposition: A | Discharge status: Home / Self Care | Payer: Medicare Other | Attending: Emergency Medicine | Admitting: Emergency Medicine

## 2014-08-04 ENCOUNTER — Emergency Department (HOSPITAL_COMMUNITY): Payer: Medicare Other

## 2014-08-04 ENCOUNTER — Encounter (HOSPITAL_COMMUNITY): Payer: Self-pay | Admitting: Family Medicine

## 2014-08-04 DIAGNOSIS — I4891 Unspecified atrial fibrillation: Secondary | ICD-10-CM | POA: Insufficient documentation

## 2014-08-04 DIAGNOSIS — R443 Hallucinations, unspecified: Secondary | ICD-10-CM

## 2014-08-04 DIAGNOSIS — Z9889 Other specified postprocedural states: Secondary | ICD-10-CM | POA: Diagnosis not present

## 2014-08-04 DIAGNOSIS — Z7901 Long term (current) use of anticoagulants: Secondary | ICD-10-CM | POA: Insufficient documentation

## 2014-08-04 DIAGNOSIS — Z79899 Other long term (current) drug therapy: Secondary | ICD-10-CM | POA: Insufficient documentation

## 2014-08-04 DIAGNOSIS — E785 Hyperlipidemia, unspecified: Secondary | ICD-10-CM | POA: Insufficient documentation

## 2014-08-04 DIAGNOSIS — Z950 Presence of cardiac pacemaker: Secondary | ICD-10-CM | POA: Insufficient documentation

## 2014-08-04 DIAGNOSIS — I509 Heart failure, unspecified: Secondary | ICD-10-CM | POA: Insufficient documentation

## 2014-08-04 DIAGNOSIS — R410 Disorientation, unspecified: Secondary | ICD-10-CM

## 2014-08-04 DIAGNOSIS — I6782 Cerebral ischemia: Secondary | ICD-10-CM | POA: Diagnosis not present

## 2014-08-04 DIAGNOSIS — Z8739 Personal history of other diseases of the musculoskeletal system and connective tissue: Secondary | ICD-10-CM | POA: Diagnosis not present

## 2014-08-04 DIAGNOSIS — I10 Essential (primary) hypertension: Secondary | ICD-10-CM | POA: Diagnosis not present

## 2014-08-04 DIAGNOSIS — R918 Other nonspecific abnormal finding of lung field: Secondary | ICD-10-CM | POA: Diagnosis not present

## 2014-08-04 DIAGNOSIS — I517 Cardiomegaly: Secondary | ICD-10-CM | POA: Diagnosis not present

## 2014-08-04 DIAGNOSIS — E119 Type 2 diabetes mellitus without complications: Secondary | ICD-10-CM | POA: Insufficient documentation

## 2014-08-04 DIAGNOSIS — R4182 Altered mental status, unspecified: Secondary | ICD-10-CM | POA: Diagnosis present

## 2014-08-04 LAB — CBC
HCT: 45 % (ref 36.0–46.0)
Hemoglobin: 14.6 g/dL (ref 12.0–15.0)
MCH: 29.4 pg (ref 26.0–34.0)
MCHC: 32.4 g/dL (ref 30.0–36.0)
MCV: 90.5 fL (ref 78.0–100.0)
Platelets: 162 10*3/uL (ref 150–400)
RBC: 4.97 MIL/uL (ref 3.87–5.11)
RDW: 15.2 % (ref 11.5–15.5)
WBC: 4.1 10*3/uL (ref 4.0–10.5)

## 2014-08-04 LAB — COMPREHENSIVE METABOLIC PANEL
ALBUMIN: 3.5 g/dL (ref 3.5–5.2)
ALT: 25 U/L (ref 0–35)
AST: 29 U/L (ref 0–37)
Alkaline Phosphatase: 102 U/L (ref 39–117)
Anion gap: 10 (ref 5–15)
BILIRUBIN TOTAL: 0.4 mg/dL (ref 0.3–1.2)
BUN: 26 mg/dL — ABNORMAL HIGH (ref 6–23)
CALCIUM: 10.6 mg/dL — AB (ref 8.4–10.5)
CO2: 28 mmol/L (ref 19–32)
CREATININE: 1.6 mg/dL — AB (ref 0.50–1.10)
Chloride: 101 mEq/L (ref 96–112)
GFR calc Af Amer: 33 mL/min — ABNORMAL LOW (ref >90)
GFR calc non Af Amer: 28 mL/min — ABNORMAL LOW (ref >90)
Glucose, Bld: 243 mg/dL — ABNORMAL HIGH (ref 70–99)
Potassium: 4 mmol/L (ref 3.5–5.1)
Sodium: 139 mmol/L (ref 135–145)
TOTAL PROTEIN: 6.9 g/dL (ref 6.0–8.3)

## 2014-08-04 LAB — URINALYSIS, ROUTINE W REFLEX MICROSCOPIC
BILIRUBIN URINE: NEGATIVE
GLUCOSE, UA: NEGATIVE mg/dL
KETONES UR: NEGATIVE mg/dL
NITRITE: NEGATIVE
PROTEIN: 30 mg/dL — AB
Specific Gravity, Urine: 1.016 (ref 1.005–1.030)
Urobilinogen, UA: 0.2 mg/dL (ref 0.0–1.0)
pH: 5 (ref 5.0–8.0)

## 2014-08-04 LAB — PROTIME-INR
INR: 2.69 — ABNORMAL HIGH (ref 0.00–1.49)
PROTHROMBIN TIME: 28.8 s — AB (ref 11.6–15.2)

## 2014-08-04 LAB — URINE MICROSCOPIC-ADD ON

## 2014-08-04 LAB — DIGOXIN LEVEL: Digoxin Level: 0.8 ng/mL (ref 0.8–2.0)

## 2014-08-04 LAB — CBG MONITORING, ED: Glucose-Capillary: 214 mg/dL — ABNORMAL HIGH (ref 70–99)

## 2014-08-04 NOTE — ED Notes (Signed)
PT returned to room. Pt monitored by pulse ox, bp cuff, and 12-lead.

## 2014-08-04 NOTE — ED Provider Notes (Signed)
CSN: [id number]     Arrival date & time 08/04/14  1849 History   First MD Initiated Contact with Patient 08/04/14 1940     Chief Complaint  Patient presents with  . Altered Mental Status     HPI  Patient presents for evaluation with multiple family members, 3 children. She lives at home with 2 of her daughters. They're concerned because she had episode today where she felt like there was a hole in the wall. She is fairly adamant about this. Then felt that there might be something under her bed. She turned her bed over and pulled all of her bed sheets off looking for something. Family states that they were surprised to find her in a room. She was calm. She stated then that she felt like she had seen this hole in the wall and was concerned that there was something under her bed. They state that she said this very calmly and "matter-of-fact". They have no other concerns about her recent behavior or baseline health. She has not been ill. She has not fallen. She has not had any complaints  Patient is able to tell me that she recalls feeling that she saw: The wall. She is now convinced that there was not one.  She complains that her family "doesn't give me enough food". When I talked this with family in front of the patient they state that with her diagnosis of diabetes since she has come to live with them they are more strict about her sugar intake. Patient admits that this is the truth. She denies being I'll treat him anyway. She has adequate food. Feels safe at home.  When I speak with the family candidly they state that occasionally she does have "moments" with difficulty with memory. She never had any episodes of agitation or paranoia.  Is on some new medication over the last 4 weeks including Cardizem and Lanoxin. Had a pacemaker placed. Was having episodes of tachycardia. Has improved on these new medications.  Episodes of bradycardia. Denies seeing halos. No nausea or vomiting.  Your episode  today family did check her blood sugar and son states that he was  160. No episodes of hypoglycemia.  Past Medical History  Diagnosis Date  . CHF (congestive heart failure)   . DM (diabetes mellitus)   . Dyslipidemia   . Thyroid goiter   . A-fib   . Osteoarthritis   . Hypertension    Past Surgical History  Procedure Laterality Date  . Papcemaker  01/2014  . Cardiac catheterization    . Abdominal hysterectomy     Family History  Problem Relation Age of Onset  . Hypertension Brother    History  Substance Use Topics  . Smoking status: Never Smoker   . Smokeless tobacco: Not on file  . Alcohol Use: Not on file   OB History    No data available     Review of Systems  Constitutional: Negative for fever, chills, diaphoresis, appetite change and fatigue.  HENT: Negative for mouth sores, sore throat and trouble swallowing.   Eyes: Negative for visual disturbance.  Respiratory: Negative for cough, chest tightness, shortness of breath and wheezing.   Cardiovascular: Negative for chest pain.  Gastrointestinal: Negative for nausea, vomiting, abdominal pain, diarrhea and abdominal distention.  Endocrine: Negative for polydipsia, polyphagia and polyuria.  Genitourinary: Negative for dysuria, frequency and hematuria.  Musculoskeletal: Negative for gait problem.  Skin: Negative for color change, pallor and rash.  Neurological: Negative for dizziness,  syncope, light-headedness and headaches.  Hematological: Does not bruise/bleed easily.  Psychiatric/Behavioral: Positive for behavioral problems. Negative for confusion.      Allergies  Review of patient's allergies indicates no known allergies.  Home Medications   Prior to Admission medications   Medication Sig Start Date End Date Taking? Authorizing Provider  calcitRIOL (ROCALTROL) 0.25 MCG capsule Take 1 capsule (0.25 mcg total) by mouth daily. 03/18/14  Yes Duke Salvia, MD  digoxin (LANOXIN) 0.125 MG tablet Take 1 tablet  (0.125 mg total) by mouth daily. 06/25/14  Yes Duke Salvia, MD  diltiazem (CARDIZEM CD) 120 MG 24 hr capsule Take 1 capsule (120 mg total) by mouth daily. 06/25/14  Yes Duke Salvia, MD  furosemide (LASIX) 20 MG tablet Take 1 tablet (20 mg total) by mouth 2 (two) times daily. 07/22/14  Yes Duke Salvia, MD  glimepiride (AMARYL) 1 MG tablet Take 0.5 mg by mouth daily with breakfast.   Yes Historical Provider, MD  lisinopril (PRINIVIL,ZESTRIL) 20 MG tablet Take 20 mg by mouth daily.   Yes Historical Provider, MD  metoprolol succinate (TOPROL-XL) 100 MG 24 hr tablet Take 1 tablet (100 mg total) by mouth daily. Take with or immediately following a meal. 06/25/14  Yes Duke Salvia, MD  potassium chloride SA (K-DUR,KLOR-CON) 20 MEQ tablet Take 10 mEq by mouth daily. 1/2 tab daily   Yes Historical Provider, MD  rosuvastatin (CRESTOR) 10 MG tablet Take 10 mg by mouth daily.   Yes Historical Provider, MD  warfarin (COUMADIN) 5 MG tablet Take 2.5-5 mg by mouth daily at 6 PM. Takes 2.5mg  on Tues and Fri Taks  all other days   Yes Historical Provider, MD   BP 154/72 mmHg  Pulse 61  Temp(Src) 98.2 F (36.8 C) (Oral)  Resp 23  SpO2 97% Physical Exam  Constitutional: She is oriented to person, place, and time. She appears well-developed and well-nourished. No distress.  This is an awake and alert elderly female. She is oriented 3. He is able to offer me details of her history.  HENT:  Head: Normocephalic.  Eyes: Conjunctivae are normal. Pupils are equal, round, and reactive to light. No scleral icterus.  Neck: Normal range of motion. Neck supple. No thyromegaly present.  Cardiovascular: Normal rate and regular rhythm.  Exam reveals no gallop and no friction rub.   No murmur heard. Pulmonary/Chest: Effort normal and breath sounds normal. No respiratory distress. She has no wheezes. She has no rales.  Abdominal: Soft. Bowel sounds are normal. She exhibits no distension. There is no tenderness.  There is no rebound.  Musculoskeletal: Normal range of motion.  Neurological: She is alert and oriented to person, place, and time.  Skin: Skin is warm and dry. No rash noted.  Psychiatric: She has a normal mood and affect. Her behavior is normal.    ED Course  Procedures (including critical care time) Labs Review Labs Reviewed  COMPREHENSIVE METABOLIC PANEL - Abnormal; Notable for the following:    Glucose, Bld 243 (*)    BUN 26 (*)    Creatinine, Ser 1.60 (*)    Calcium 10.6 (*)    GFR calc non Af Amer 28 (*)    GFR calc Af Amer 33 (*)    All other components within normal limits  PROTIME-INR - Abnormal; Notable for the following:    Prothrombin Time 28.8 (*)    INR 2.69 (*)    All other components within normal limits  URINALYSIS, ROUTINE W  REFLEX MICROSCOPIC - Abnormal; Notable for the following:    APPearance CLOUDY (*)    Hgb urine dipstick SMALL (*)    Protein, ur 30 (*)    Leukocytes, UA SMALL (*)    All other components within normal limits  URINE MICROSCOPIC-ADD ON - Abnormal; Notable for the following:    Squamous Epithelial / LPF FEW (*)    Bacteria, UA MANY (*)    Casts HYALINE CASTS (*)    All other components within normal limits  CBG MONITORING, ED - Abnormal; Notable for the following:    Glucose-Capillary 214 (*)    All other components within normal limits  CBC  DIGOXIN LEVEL    Imaging Review Ct Head Wo Contrast  08/04/2014   CLINICAL DATA:  Initial encounter for mental status changes. Hallucinations. Confusion.  EXAM: CT HEAD WITHOUT CONTRAST  TECHNIQUE: Contiguous axial images were obtained from the base of the skull through the vertex without intravenous contrast.  COMPARISON:  None.  FINDINGS: Sinuses/Soft tissues: Mucosal thickening of the right sphenoid sinus. Hyperostosis frontalis interna. Cerumen in both external ear canals. Clear mastoid air cells.  Intracranial: Mild low density in the periventricular white matter likely related to small  vessel disease. Remote lacunar infarct in left basal ganglia laterally. Bilateral vertebral artery atherosclerosis. No mass lesion, hemorrhage, hydrocephalus, acute infarct, intra-axial, or extra-axial fluid collection.  IMPRESSION: 1.  No acute intracranial abnormality. 2. Small vessel ischemic change. 3. Mild sinus disease.   Electronically Signed   By: Jeronimo Greaves M.D.   On: 08/04/2014 21:44   Dg Chest Port 1 View  08/04/2014   CLINICAL DATA:  Odd behavior for 3 days  EXAM: PORTABLE CHEST - 1 VIEW  COMPARISON:  03/13/2014  FINDINGS: Moderate cardiomegaly. Normal vascularity. Left subclavian pacemaker and leads are stable and intact. New central right upper lung zone oval opacity has developed. Low volumes. No pneumothorax. No pleural effusion.  IMPRESSION: New central right lung oval opacity. Underlying pulmonary nodule is not excluded. Upright PA and lateral chest radiographs are recommended.  Cardiomegaly without decompensation.   Electronically Signed   By: Maryclare Bean M.D.   On: 08/04/2014 21:22     EKG Interpretation None      MDM   Final diagnoses:  Confusion  Hallucination    Patient remains awake and alert oriented conversational here. Family is present through her entire stay and they had no current concerns about her cognition. I recommended that they speak with her primary care physician about possible neuropsychiatric testing. They note that occasionally she will "have moments" with difficulty with memory or confusion but no crescendoing symptoms recently. I discussed with them outpatient follow-up and return if any worsening symptoms    Rolland Porter, MD 08/04/14 2317

## 2014-08-04 NOTE — Discharge Instructions (Signed)
I recommend follow-up with her primary care physician for possible neuropsychiatric testing. If patient has recurrence of symptoms, or develops any medical symptoms such as fever, shortness of breath, or any other changes in please recheck here.  Confusion Confusion is the inability to think with your usual speed or clarity. Confusion may come on quickly or slowly over time. How quickly the confusion comes on depends on the cause. Confusion can be due to any number of causes. CAUSES   Concussion, head injury, or head trauma.  Seizures.  Stroke.  Fever.  Brain tumor.  Age related decreased brain function (dementia).  Heightened emotional states like rage or terror.  Mental illness in which the person loses the ability to determine what is real and what is not (hallucinations).  Infections such as a urinary tract infection (UTI).  Toxic effects from alcohol, drugs, or prescription medicines.  Dehydration and an imbalance of salts in the body (electrolytes).  Lack of sleep.  Low blood sugar (diabetes).  Low levels of oxygen from conditions such as chronic lung disorders.  Drug interactions or other medicine side effects.  Nutritional deficiencies, especially niacin, thiamine, vitamin C, or vitamin B.  Sudden drop in body temperature (hypothermia).  Change in routine, such as when traveling or hospitalized. SIGNS AND SYMPTOMS  People often describe their thinking as cloudy or unclear when they are confused. Confusion can also include feeling disoriented. That means you are unaware of where or who you are. You may also not know what the date or time is. If confused, you may also have difficulty paying attention, remembering, and making decisions. Some people also act aggressively when they are confused.  DIAGNOSIS  The medical evaluation of confusion may include:  Blood and urine tests.  X-rays.  Brain and nervous system tests.  Analyzing your brain waves  (electroencephalogram or EEG).  Magnetic resonance imaging (MRI) of your head.  Computed tomography (CT) scan of your head.  Mental status tests in which your health care provider may ask many questions. Some of these questions may seem silly or strange, but they are a very important test to help diagnose and treat confusion. TREATMENT  An admission to the hospital may not be needed, but a person with confusion should not be left alone. Stay with a family member or friend until the confusion clears. Avoid alcohol, pain relievers, or sedative drugs until you have fully recovered. Do not drive until directed by your health care provider. HOME CARE INSTRUCTIONS  What family and friends can do:  To find out if someone is confused, ask the person to state his or her name, age, and the date. If the person is unsure or answers incorrectly, he or she is confused.  Always introduce yourself, no matter how well the person knows you.  Often remind the person of his or her location.  Place a calendar and clock near the confused person.  Help the person with his or her medicines. You may want to use a pill box, an alarm as a reminder, or give the person each dose as prescribed.  Talk about current events and plans for the day.  Try to keep the environment calm, quiet, and peaceful.  Make sure the person keeps follow-up visits with his or her health care provider. PREVENTION  Ways to prevent confusion:  Avoid alcohol.  Eat a balanced diet.  Get enough sleep.  Take medicine only as directed by your health care provider.  Do not become isolated. Spend time  with other people and make plans for your days.  Keep careful watch on your blood sugar levels if you are diabetic. SEEK IMMEDIATE MEDICAL CARE IF:   You develop severe headaches, repeated vomiting, seizures, blackouts, or slurred speech.  There is increasing confusion, weakness, numbness, restlessness, or personality changes.  You  develop a loss of balance, have marked dizziness, feel uncoordinated, or fall.  You have delusions, hallucinations, or develop severe anxiety.  Your family members think you need to be rechecked. Document Released: 08/11/2004 Document Revised: 11/18/2013 Document Reviewed: 08/09/2013 [facility] Patient Information 2015 [place], [place]. This information is not intended to replace advice given to you by your health care provider. Make sure you discuss any questions you have with your health care provider.

## 2014-08-04 NOTE — ED Notes (Signed)
Pt here for odd behavior over the past few days. Per family she has been having episodes of acting out and hallucinations. Denies any sickness recently.

## 2014-08-04 NOTE — ED Notes (Signed)
CBG = 214  RN Yasemia informed of results.

## 2014-08-06 DIAGNOSIS — J449 Chronic obstructive pulmonary disease, unspecified: Secondary | ICD-10-CM | POA: Diagnosis not present

## 2014-08-06 DIAGNOSIS — I5030 Unspecified diastolic (congestive) heart failure: Secondary | ICD-10-CM | POA: Diagnosis not present

## 2014-08-06 DIAGNOSIS — N183 Chronic kidney disease, stage 3 (moderate): Secondary | ICD-10-CM | POA: Diagnosis not present

## 2014-08-06 DIAGNOSIS — I509 Heart failure, unspecified: Secondary | ICD-10-CM | POA: Diagnosis not present

## 2014-08-06 DIAGNOSIS — I4891 Unspecified atrial fibrillation: Secondary | ICD-10-CM | POA: Diagnosis not present

## 2014-08-06 DIAGNOSIS — I129 Hypertensive chronic kidney disease with stage 1 through stage 4 chronic kidney disease, or unspecified chronic kidney disease: Secondary | ICD-10-CM | POA: Diagnosis not present

## 2014-08-15 DIAGNOSIS — I4891 Unspecified atrial fibrillation: Secondary | ICD-10-CM | POA: Diagnosis not present

## 2014-08-19 ENCOUNTER — Encounter: Payer: Self-pay | Admitting: Podiatry

## 2014-08-19 ENCOUNTER — Ambulatory Visit (INDEPENDENT_AMBULATORY_CARE_PROVIDER_SITE_OTHER): Payer: Medicare Other | Admitting: Podiatry

## 2014-08-19 ENCOUNTER — Ambulatory Visit (INDEPENDENT_AMBULATORY_CARE_PROVIDER_SITE_OTHER): Payer: Medicare Other

## 2014-08-19 VITALS — BP 94/68 | HR 52 | Resp 11 | Ht 64.0 in | Wt 148.0 lb

## 2014-08-19 DIAGNOSIS — M2040 Other hammer toe(s) (acquired), unspecified foot: Secondary | ICD-10-CM | POA: Diagnosis not present

## 2014-08-19 DIAGNOSIS — E1142 Type 2 diabetes mellitus with diabetic polyneuropathy: Secondary | ICD-10-CM | POA: Diagnosis not present

## 2014-08-19 NOTE — Progress Notes (Signed)
   Subjective:    Patient ID: [name], female    DOB: 1927-01-30, 81 y.o.   MRN: [id number]  HPI Comments: Pt presents with 3 daughters for diabetic foot exam and debridement of toenails.  Pt states she had a corn removed from left 5th lateral toe, and now has pain again.  Diabetes      Review of Systems  All other systems reviewed and are negative.      Objective:   Physical Exam: I have reviewed her past medical history medications allergies surgery social history and review of systems. Pulses are palpable bilateral. She has a slight decrease per Semmes-Weinstein monofilament and vibratory sensation to the bilateral foot. Muscle strength is 5 over 5 dorsiflexion plantar flexors and inverters everters all into the musculature is intact. 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 Radiographic evaluation does not demonstrate any type of osseus abnormalities other than early osteophytic process midfoot and some mild osteopenia. Cutaneous evaluation of a straight supple well-hydrated cutis no erythema edema cellulitis drainage or odor. Nails are thickened and slightly mycotic but do not need to be cut today.        Assessment & Plan:  Assessment: Diabetes with diabetic peripheral neuropathy.  Plan: Follow up with her for nail and callus debridement on an as-needed basis.

## 2014-09-11 DIAGNOSIS — I4891 Unspecified atrial fibrillation: Secondary | ICD-10-CM | POA: Diagnosis not present

## 2014-09-23 ENCOUNTER — Encounter: Payer: Self-pay | Admitting: Internal Medicine

## 2014-09-23 ENCOUNTER — Other Ambulatory Visit: Payer: Self-pay | Admitting: Internal Medicine

## 2014-09-23 ENCOUNTER — Ambulatory Visit (INDEPENDENT_AMBULATORY_CARE_PROVIDER_SITE_OTHER): Payer: Medicare Other | Admitting: Internal Medicine

## 2014-09-23 VITALS — BP 152/92 | HR 59 | Ht 63.0 in | Wt 146.1 lb

## 2014-09-23 DIAGNOSIS — Z45018 Encounter for adjustment and management of other part of cardiac pacemaker: Secondary | ICD-10-CM | POA: Diagnosis not present

## 2014-09-23 DIAGNOSIS — I482 Chronic atrial fibrillation, unspecified: Secondary | ICD-10-CM

## 2014-09-23 LAB — MDC_IDC_ENUM_SESS_TYPE_INCLINIC
Brady Statistic RA Percent Paced: 0 %
Brady Statistic RV Percent Paced: 87 %
Date Time Interrogation Session: 20160308172100
Lead Channel Impedance Value: 562.5 Ohm
Lead Channel Pacing Threshold Amplitude: 0.75 V
Lead Channel Pacing Threshold Amplitude: 0.75 V
Lead Channel Pacing Threshold Pulse Width: 0.5 ms
Lead Channel Sensing Intrinsic Amplitude: 12 mV
Lead Channel Setting Pacing Amplitude: 2.25 V
Lead Channel Setting Pacing Pulse Width: 0.5 ms
Lead Channel Setting Sensing Sensitivity: 2 mV
MDC IDC MSMT BATTERY REMAINING LONGEVITY: 78 mo
MDC IDC MSMT BATTERY VOLTAGE: 3.01 V
MDC IDC MSMT LEADCHNL LV IMPEDANCE VALUE: 400 Ohm
MDC IDC MSMT LEADCHNL LV PACING THRESHOLD PULSEWIDTH: 0.8 ms
MDC IDC PG MODEL: 3242
MDC IDC PG SERIAL: 7594455
MDC IDC SET LEADCHNL LV PACING PULSEWIDTH: 1.2 ms
MDC IDC SET LEADCHNL RV PACING AMPLITUDE: 2.5 V

## 2014-09-23 MED ORDER — DILTIAZEM HCL ER COATED BEADS 120 MG PO CP24: 120.0000 mg | ORAL_CAPSULE | Freq: Two times a day (BID) | ORAL | Status: DC

## 2014-09-23 MED ORDER — METOPROLOL SUCCINATE ER 50 MG PO TB24: 50.0000 mg | ORAL_TABLET | Freq: Every day | ORAL | Status: DC

## 2014-09-23 NOTE — Patient Instructions (Addendum)
Your physician has recommended you make the following change in your medication:  1) INCREASE Diltiazem to 120 mg twice a day 2) DECREASE Metoprolol to 50 mg daily 3) STOP Digoxin  Your physician wants you to follow-up in: 6 months with Dr. Graciela Husbands. You will receive a reminder letter in the mail two months in advance. If you don't receive a letter, please call our office to schedule the follow-up appointment.

## 2014-09-23 NOTE — Progress Notes (Signed)
Patient Care Team: Clelia Schaumann, MD as PCP - General (Internal Medicine)   HPI  [name] is a 81 y.o. female Seen in followup following a recent pacemaker implanted in [place] at which time she received a CRT P. She has atrial fibrillation which is permanent associated with a rapid response. At her hospitalization in [place] she was noted to have an opacified right lung; repeat chest x-ray here demonstrated aeration.  Most recent Cr 1.6  1/16  We increased her beta blocker to see if we can improve heart rate control with the idea being that AV junction ablation might be necessary; this was assoc with fatigue so dose was reduced and CCB added and with much trepidation dig  Level 0.8  Ejection fraction of her to 40-45%   She is improved with less DOE but fatigue did not change  They have also noted some anxiety  Blood pressures at home are 160-70   Past Medical History  Diagnosis Date  . CHF (congestive heart failure)   . DM (diabetes mellitus)   . Dyslipidemia   . Thyroid goiter   . A-fib   . Osteoarthritis   . Hypertension     Past Surgical History  Procedure Laterality Date  . Papcemaker  01/2014  . Cardiac catheterization    . Abdominal hysterectomy      Current Outpatient Prescriptions  Medication Sig Dispense Refill  . calcitRIOL (ROCALTROL) 0.25 MCG capsule TAKE 1 CAPSULE (0.25 MCG TOTAL) BY MOUTH DAILY. 30 capsule 0  . digoxin (LANOXIN) 0.125 MG tablet Take 1 tablet (0.125 mg total) by mouth daily. 30 tablet 3  . diltiazem (CARDIZEM CD) 120 MG 24 hr capsule Take 1 capsule (120 mg total) by mouth daily. 30 capsule 3  . furosemide (LASIX) 20 MG tablet Take 1 tablet (20 mg total) by mouth 2 (two) times daily. 180 tablet 1  . glimepiride (AMARYL) 1 MG tablet Take 0.5 mg by mouth daily with breakfast.    . lisinopril (PRINIVIL,ZESTRIL) 20 MG tablet Take 20 mg by mouth daily.    . metoprolol succinate (TOPROL-XL) 100 MG 24 hr tablet Take 1  tablet (100 mg total) by mouth daily. Take with or immediately following a meal. 30 tablet 3  . potassium chloride SA (K-DUR,KLOR-CON) 20 MEQ tablet Take 10 mEq by mouth daily. 1/2 tab daily    . rosuvastatin (CRESTOR) 10 MG tablet Take 10 mg by mouth daily.    Marland Kitchen warfarin (COUMADIN) 5 MG tablet Take 2.5-5 mg by mouth daily at 6 PM. Takes 3.0mg  on Tues and Fri Taks 6mg  all other days     No current facility-administered medications for this visit.    No Known Allergies  Review of Systems negative except from HPI and PMH  Physical Exam BP 152/92 mmHg  Pulse 59  Ht 5\' 3"  (1.6 m)  Wt 146 lb 1.9 oz (66.28 kg)  BMI 25.89 kg/m2 Well developed and well nourished in no acute distress HENT normal E scleral and icterus clear Neck Supple JVP 7  Clear to ausculation  Regular rate and rhythm, no murmurs gallops or rub Soft with active bowel sounds No clubbing cyanosis  Tr Edema Alert and oriented, grossly normal motor and sensory function  Walks with walker Skin Warm and Dry    Assessment and  Plan  HFpEF  Hypertension  Atrial fibrillation-permanent  fatgiue  Pacemaker-CRT-St. Jude  <5%  of her heart beats are recorded as faster than  90 bpm We will stop low-dose digoxin and increase l e diltiazem 120 mg >>120 bid     This should also help with BP  We will have after one month stop the statin and see if fatigue anttenuates at all  About this time she will be following up with her PCP  s

## 2014-09-27 ENCOUNTER — Emergency Department (HOSPITAL_COMMUNITY): Payer: Medicare Other

## 2014-09-27 ENCOUNTER — Inpatient Hospital Stay (HOSPITAL_COMMUNITY)
Admission: EM | Admit: 2014-09-27 | Discharge: 2014-09-30 | DRG: 884 | Disposition: A | Discharge status: Home / Self Care | Payer: Medicare Other | Attending: Internal Medicine | Admitting: Internal Medicine

## 2014-09-27 ENCOUNTER — Encounter (HOSPITAL_COMMUNITY): Payer: Self-pay | Admitting: Emergency Medicine

## 2014-09-27 DIAGNOSIS — I10 Essential (primary) hypertension: Secondary | ICD-10-CM | POA: Diagnosis present

## 2014-09-27 DIAGNOSIS — I482 Chronic atrial fibrillation, unspecified: Secondary | ICD-10-CM | POA: Diagnosis present

## 2014-09-27 DIAGNOSIS — I5032 Chronic diastolic (congestive) heart failure: Secondary | ICD-10-CM | POA: Diagnosis not present

## 2014-09-27 DIAGNOSIS — I480 Paroxysmal atrial fibrillation: Secondary | ICD-10-CM | POA: Diagnosis not present

## 2014-09-27 DIAGNOSIS — E118 Type 2 diabetes mellitus with unspecified complications: Secondary | ICD-10-CM | POA: Diagnosis not present

## 2014-09-27 DIAGNOSIS — F0391 Unspecified dementia with behavioral disturbance: Secondary | ICD-10-CM | POA: Diagnosis present

## 2014-09-27 DIAGNOSIS — I214 Non-ST elevation (NSTEMI) myocardial infarction: Secondary | ICD-10-CM | POA: Diagnosis not present

## 2014-09-27 DIAGNOSIS — I429 Cardiomyopathy, unspecified: Secondary | ICD-10-CM | POA: Diagnosis present

## 2014-09-27 DIAGNOSIS — R410 Disorientation, unspecified: Secondary | ICD-10-CM | POA: Diagnosis present

## 2014-09-27 DIAGNOSIS — R61 Generalized hyperhidrosis: Secondary | ICD-10-CM | POA: Diagnosis not present

## 2014-09-27 DIAGNOSIS — R778 Other specified abnormalities of plasma proteins: Secondary | ICD-10-CM | POA: Insufficient documentation

## 2014-09-27 DIAGNOSIS — E119 Type 2 diabetes mellitus without complications: Secondary | ICD-10-CM

## 2014-09-27 DIAGNOSIS — I509 Heart failure, unspecified: Secondary | ICD-10-CM | POA: Diagnosis present

## 2014-09-27 DIAGNOSIS — I350 Nonrheumatic aortic (valve) stenosis: Secondary | ICD-10-CM

## 2014-09-27 DIAGNOSIS — E785 Hyperlipidemia, unspecified: Secondary | ICD-10-CM | POA: Diagnosis present

## 2014-09-27 DIAGNOSIS — M1990 Unspecified osteoarthritis, unspecified site: Secondary | ICD-10-CM | POA: Diagnosis present

## 2014-09-27 DIAGNOSIS — R4182 Altered mental status, unspecified: Secondary | ICD-10-CM | POA: Diagnosis not present

## 2014-09-27 DIAGNOSIS — Z950 Presence of cardiac pacemaker: Secondary | ICD-10-CM | POA: Diagnosis not present

## 2014-09-27 DIAGNOSIS — R7989 Other specified abnormal findings of blood chemistry: Secondary | ICD-10-CM | POA: Diagnosis present

## 2014-09-27 DIAGNOSIS — I5030 Unspecified diastolic (congestive) heart failure: Secondary | ICD-10-CM

## 2014-09-27 DIAGNOSIS — Z7901 Long term (current) use of anticoagulants: Secondary | ICD-10-CM

## 2014-09-27 DIAGNOSIS — R748 Abnormal levels of other serum enzymes: Secondary | ICD-10-CM | POA: Diagnosis not present

## 2014-09-27 HISTORY — DX: Essential (primary) hypertension: I10

## 2014-09-27 HISTORY — DX: Type 2 diabetes mellitus without complications: E11.9

## 2014-09-27 LAB — CBC WITH DIFFERENTIAL/PLATELET
BASOS ABS: 0 10*3/uL (ref 0.0–0.1)
BASOS PCT: 0 % (ref 0–1)
EOS PCT: 2 % (ref 0–5)
Eosinophils Absolute: 0.1 10*3/uL (ref 0.0–0.7)
HEMATOCRIT: 41.1 % (ref 36.0–46.0)
Hemoglobin: 13.2 g/dL (ref 12.0–15.0)
Lymphocytes Relative: 26 % (ref 12–46)
Lymphs Abs: 1.4 10*3/uL (ref 0.7–4.0)
MCH: 29.7 pg (ref 26.0–34.0)
MCHC: 32.1 g/dL (ref 30.0–36.0)
MCV: 92.4 fL (ref 78.0–100.0)
Monocytes Absolute: 0.4 10*3/uL (ref 0.1–1.0)
Monocytes Relative: 8 % (ref 3–12)
Neutro Abs: 3.6 10*3/uL (ref 1.7–7.7)
Neutrophils Relative %: 64 % (ref 43–77)
Platelets: 163 10*3/uL (ref 150–400)
RBC: 4.45 MIL/uL (ref 3.87–5.11)
RDW: 14.4 % (ref 11.5–15.5)
WBC: 5.5 10*3/uL (ref 4.0–10.5)

## 2014-09-27 LAB — PROTIME-INR
INR: 3.84 — AB (ref 0.00–1.49)
Prothrombin Time: 38.1 seconds — ABNORMAL HIGH (ref 11.6–15.2)

## 2014-09-27 LAB — COMPREHENSIVE METABOLIC PANEL
ALT: 22 U/L (ref 0–35)
ANION GAP: 5 (ref 5–15)
AST: 23 U/L (ref 0–37)
Albumin: 3.6 g/dL (ref 3.5–5.2)
Alkaline Phosphatase: 92 U/L (ref 39–117)
BUN: 22 mg/dL (ref 6–23)
CALCIUM: 10.5 mg/dL (ref 8.4–10.5)
CO2: 28 mmol/L (ref 19–32)
Chloride: 107 mmol/L (ref 96–112)
Creatinine, Ser: 1.23 mg/dL — ABNORMAL HIGH (ref 0.50–1.10)
GFR calc non Af Amer: 39 mL/min — ABNORMAL LOW (ref >90)
GFR, EST AFRICAN AMERICAN: 45 mL/min — AB (ref >90)
Glucose, Bld: 140 mg/dL — ABNORMAL HIGH (ref 70–99)
Potassium: 3.9 mmol/L (ref 3.5–5.1)
Sodium: 140 mmol/L (ref 135–145)
Total Bilirubin: 0.6 mg/dL (ref 0.3–1.2)
Total Protein: 7 g/dL (ref 6.0–8.3)

## 2014-09-27 LAB — URINALYSIS, ROUTINE W REFLEX MICROSCOPIC
BILIRUBIN URINE: NEGATIVE
Glucose, UA: NEGATIVE mg/dL
KETONES UR: NEGATIVE mg/dL
Leukocytes, UA: NEGATIVE
Nitrite: NEGATIVE
PH: 6.5 (ref 5.0–8.0)
PROTEIN: NEGATIVE mg/dL
Specific Gravity, Urine: 1.012 (ref 1.005–1.030)
UROBILINOGEN UA: 0.2 mg/dL (ref 0.0–1.0)

## 2014-09-27 LAB — GLUCOSE, CAPILLARY
GLUCOSE-CAPILLARY: 210 mg/dL — AB (ref 70–99)
GLUCOSE-CAPILLARY: 158 mg/dL — AB (ref 70–99)
Glucose-Capillary: 116 mg/dL — ABNORMAL HIGH (ref 70–99)
Glucose-Capillary: 185 mg/dL — ABNORMAL HIGH (ref 70–99)

## 2014-09-27 LAB — TROPONIN I
TROPONIN I: 0.12 ng/mL — AB (ref <0.031)
Troponin I: 0.11 ng/mL — ABNORMAL HIGH (ref <0.031)
Troponin I: 0.11 ng/mL — ABNORMAL HIGH (ref <0.031)
Troponin I: 0.1 ng/mL — ABNORMAL HIGH (ref <0.031)

## 2014-09-27 LAB — VITAMIN B12: Vitamin B-12: 326 pg/mL (ref 211–911)

## 2014-09-27 LAB — TSH: TSH: 1.535 u[IU]/mL (ref 0.350–4.500)

## 2014-09-27 LAB — URINE MICROSCOPIC-ADD ON

## 2014-09-27 LAB — I-STAT TROPONIN, ED: TROPONIN I, POC: 0.11 ng/mL — AB (ref 0.00–0.08)

## 2014-09-27 LAB — MRSA PCR SCREENING: MRSA BY PCR: NEGATIVE

## 2014-09-27 MED ORDER — POTASSIUM CHLORIDE CRYS ER 10 MEQ PO TBCR
10.0000 meq | EXTENDED_RELEASE_TABLET | Freq: Every day | ORAL | Status: DC
  Administered 2014-09-27 – 2014-09-30 (×4): 10 meq via ORAL
  Filled 2014-09-27 (×3): qty 1

## 2014-09-27 MED ORDER — INSULIN ASPART 100 UNIT/ML ~~LOC~~ SOLN
0.0000 [IU] | Freq: Three times a day (TID) | SUBCUTANEOUS | Status: DC
  Administered 2014-09-27: 3 [IU] via SUBCUTANEOUS
  Administered 2014-09-27: 5 [IU] via SUBCUTANEOUS
  Administered 2014-09-28: 3 [IU] via SUBCUTANEOUS
  Administered 2014-09-29: 5 [IU] via SUBCUTANEOUS
  Administered 2014-09-29 – 2014-09-30 (×2): 3 [IU] via SUBCUTANEOUS

## 2014-09-27 MED ORDER — WARFARIN SODIUM 3 MG PO TABS: 3.0000 mg | ORAL_TABLET | Freq: Every day | ORAL | Status: DC

## 2014-09-27 MED ORDER — FUROSEMIDE 20 MG PO TABS
20.0000 mg | ORAL_TABLET | Freq: Two times a day (BID) | ORAL | Status: DC
  Administered 2014-09-27 – 2014-09-30 (×6): 20 mg via ORAL
  Filled 2014-09-27 (×9): qty 1

## 2014-09-27 MED ORDER — OXYCODONE HCL 5 MG PO TABS: 5.0000 mg | ORAL_TABLET | ORAL | Status: DC | PRN

## 2014-09-27 MED ORDER — MORPHINE SULFATE 2 MG/ML IJ SOLN: 1.0000 mg | INTRAMUSCULAR | Status: DC | PRN

## 2014-09-27 MED ORDER — DILTIAZEM HCL ER COATED BEADS 120 MG PO CP24
120.0000 mg | ORAL_CAPSULE | Freq: Two times a day (BID) | ORAL | Status: DC
  Administered 2014-09-27 – 2014-09-30 (×5): 120 mg via ORAL
  Filled 2014-09-27 (×10): qty 1

## 2014-09-27 MED ORDER — ASPIRIN 325 MG PO TABS
325.0000 mg | ORAL_TABLET | Freq: Once | ORAL | Status: AC
  Administered 2014-09-27: 325 mg via ORAL
  Filled 2014-09-27: qty 1

## 2014-09-27 MED ORDER — LISINOPRIL 20 MG PO TABS
20.0000 mg | ORAL_TABLET | Freq: Every day | ORAL | Status: DC
  Administered 2014-09-27 – 2014-09-30 (×3): 20 mg via ORAL
  Filled 2014-09-27 (×4): qty 1

## 2014-09-27 MED ORDER — CALCITRIOL 0.25 MCG PO CAPS
0.2500 ug | ORAL_CAPSULE | Freq: Every day | ORAL | Status: DC
  Administered 2014-09-27 – 2014-09-30 (×4): 0.25 ug via ORAL
  Filled 2014-09-27 (×4): qty 1

## 2014-09-27 MED ORDER — ONDANSETRON HCL 4 MG PO TABS: 4.0000 mg | ORAL_TABLET | Freq: Four times a day (QID) | ORAL | Status: DC | PRN

## 2014-09-27 MED ORDER — ASPIRIN 325 MG PO TABS
325.0000 mg | ORAL_TABLET | Freq: Every day | ORAL | Status: DC
  Administered 2014-09-27 – 2014-09-28 (×2): 325 mg via ORAL
  Filled 2014-09-27 (×3): qty 1

## 2014-09-27 MED ORDER — ALUM & MAG HYDROXIDE-SIMETH 200-200-20 MG/5ML PO SUSP: 30.0000 mL | Freq: Four times a day (QID) | ORAL | Status: DC | PRN

## 2014-09-27 MED ORDER — INSULIN ASPART 100 UNIT/ML ~~LOC~~ SOLN: 0.0000 [IU] | Freq: Every day | SUBCUTANEOUS | Status: DC

## 2014-09-27 MED ORDER — ONDANSETRON HCL 4 MG/2ML IJ SOLN: 4.0000 mg | Freq: Four times a day (QID) | INTRAMUSCULAR | Status: DC | PRN

## 2014-09-27 MED ORDER — NITROGLYCERIN 2 % TD OINT
0.5000 [in_us] | TOPICAL_OINTMENT | Freq: Four times a day (QID) | TRANSDERMAL | Status: DC
  Administered 2014-09-27 – 2014-09-28 (×5): 0.5 [in_us] via TOPICAL
  Filled 2014-09-27: qty 30

## 2014-09-27 MED ORDER — ACETAMINOPHEN 325 MG PO TABS: 650.0000 mg | ORAL_TABLET | Freq: Four times a day (QID) | ORAL | Status: DC | PRN

## 2014-09-27 MED ORDER — ROSUVASTATIN CALCIUM 10 MG PO TABS
10.0000 mg | ORAL_TABLET | Freq: Every day | ORAL | Status: DC
  Administered 2014-09-27 – 2014-09-30 (×4): 10 mg via ORAL
  Filled 2014-09-27 (×4): qty 1

## 2014-09-27 MED ORDER — ACETAMINOPHEN 650 MG RE SUPP: 650.0000 mg | Freq: Four times a day (QID) | RECTAL | Status: DC | PRN

## 2014-09-27 MED ORDER — METOPROLOL SUCCINATE ER 50 MG PO TB24
50.0000 mg | ORAL_TABLET | Freq: Every day | ORAL | Status: DC
  Administered 2014-09-27 – 2014-09-30 (×3): 50 mg via ORAL
  Filled 2014-09-27 (×4): qty 1

## 2014-09-27 MED ORDER — HYDRALAZINE HCL 20 MG/ML IJ SOLN
10.0000 mg | Freq: Four times a day (QID) | INTRAMUSCULAR | Status: DC | PRN
  Administered 2014-09-27 – 2014-09-28 (×2): 10 mg via INTRAVENOUS
  Filled 2014-09-27 (×3): qty 1

## 2014-09-27 MED ORDER — SODIUM CHLORIDE 0.9 % IV SOLN
INTRAVENOUS | Status: DC
  Administered 2014-09-27: 07:00:00 via INTRAVENOUS
  Administered 2014-09-28: 50 mL via INTRAVENOUS

## 2014-09-27 MED ORDER — WARFARIN - PHARMACIST DOSING INPATIENT
Freq: Every day | Status: DC
  Administered 2014-09-28: 21:00:00
  Administered 2014-09-29: 1

## 2014-09-27 NOTE — Consult Note (Signed)
Primary cardiologist: Dr. Sherryl Manges Consulting cardiologist: Jonelle Sidle  Reason for consultation: Abnormal troponin I   Clinical Summary [name] is an 81 y.o.female with past medical history outlined below area she is currently admitted to the hospital with recent confusion, and hallucinations at night time. Patient's daughter with whom she lives states that the patient reported seeing snakes at night time and being anxious and upset. Dementia and/or occult infection are suspected and being worked up by the primary team. In the process of her evaluation, cardiac markers were also sent. She does not endorse any chest pain, reports intermittent shortness of breath although no major change in pattern. She has minimally elevated troponin I levels in a relatively flat pattern, not consistent with ACS.  She has been recently followed by Dr. Graciela Husbands for pacemaker interrogation, just recently evaluated (see report in EPIC). Digoxin was discontinued with increase in diltiazem per Dr. Graciela Husbands. Goal has been overall improvement in heart rate control with permanent atrial fibrillation underlying her paced rhythm.  [name] gets assistance from her family members with medications. Compliance is reported. She does not provide very detailed history, states that she sometimes feels like she is short of breath, but has had no chest pain or palpitations, no recent falls. No obvious bleeding problems on Coumadin.   No Known Allergies  Medications Scheduled Medications: . aspirin  325 mg Oral Daily  . calcitRIOL  0.25 mcg Oral Daily  . diltiazem  120 mg Oral BID  . furosemide  20 mg Oral BID  . insulin aspart  0-15 Units Subcutaneous TID WC  . insulin aspart  0-5 Units Subcutaneous QHS  . lisinopril  20 mg Oral Daily  . metoprolol succinate  50 mg Oral Daily  . nitroGLYCERIN  0.5 inch Topical 4 times per day  . potassium chloride SA  10 mEq Oral Daily  . rosuvastatin  10 mg Oral Daily  .  Warfarin - Pharmacist Dosing Inpatient   Does not apply q1800     Infusions: . sodium chloride 50 mL/hr at 09/27/14 0636     PRN Medications:  acetaminophen **OR** acetaminophen, alum & mag hydroxide-simeth, morphine injection, ondansetron **OR** ondansetron (ZOFRAN) IV, oxyCODONE   Past Medical History  Diagnosis Date  . Cardiomyopathy     LVEF 40-45% August 2015  . Type 2 diabetes mellitus   . Dyslipidemia   . Thyroid goiter   . Atrial fibrillation     Coumadin  . Osteoarthritis   . Essential hypertension   . Aortic stenosis     Moderate to severe August 2015     Past Surgical History  Procedure Laterality Date  . Pacemaker insertion      St Jude CRT-P ([place])  . Cardiac catheterization    . Abdominal hysterectomy      Family History  Problem Relation Age of Onset  . Hypertension Brother     Social History [name] reports that she has never smoked. She does not have any smokeless tobacco history on file. [name] reports that she does not drink alcohol.  Review of Systems Complete review of systems negative except as otherwise outlined in the clinical summary.  Physical Examination Blood pressure 156/91, pulse 60, temperature 98.1 F (36.7 C), temperature source Oral, resp. rate 21, height 5\' 4"  (1.626 m), weight 145 lb 11.2 oz (66.089 kg), SpO2 95 %.  Intake/Output Summary (Last 24 hours) at 09/27/14 1320 Last data filed at 09/27/14 1200  Gross per 24  hour  Intake    540 ml  Output    750 ml  Net   -210 ml   Telemetry: Ventricular paced rhythm.  Elderly woman, appears comfortable at rest. HEENT: Conjunctiva and lids normal, oropharynx clear. Neck: Supple, no elevated JVP or carotid bruits, no thyromegaly. Lungs: Clear to auscultation, nonlabored breathing at rest. Cardiac: Indistinct PMI, regular rate and rhythm, no S3, soft basal systolic murmur, no pericardial rub. Abdomen: Soft, nontender, bowel sounds present, no guarding or  rebound. Extremities: No pitting edema, distal pulses 2+. Skin: Warm and dry. Musculoskeletal: Kyphosis noted. Neuropsychiatric: Alert and oriented x2, affect grossly appropriate. Moves all extremities.   Lab Results  Basic Metabolic Panel:  Recent Labs Lab 09/27/14 0248  NA 140  K 3.9  CL 107  CO2 28  GLUCOSE 140*  BUN 22  CREATININE 1.23*  CALCIUM 10.5    Liver Function Tests:  Recent Labs Lab 09/27/14 0248  AST 23  ALT 22  ALKPHOS 92  BILITOT 0.6  PROT 7.0  ALBUMIN 3.6    CBC:  Recent Labs Lab 09/27/14 0248  WBC 5.5  NEUTROABS 3.6  HGB 13.2  HCT 41.1  MCV 92.4  PLT 163    Cardiac Enzymes:  Recent Labs Lab 09/27/14 0255 09/27/14 0520 09/27/14 1150  TROPONINI 0.12* 0.11* 0.11*    ECG Ventricular paced rhythm with underlying atrial fibrillation.  Imaging CHEST 2 VIEW  COMPARISON: 08/04/2014  FINDINGS: Dual lead left-sided pacemaker remains in place. There is unchanged eventration of right hemidiaphragm. Mild cardiomegaly is unchanged allowing for differences in technique. No pulmonary edema. The previous right suprahilar opacity is no longer seen. There is no pleural effusion or pneumothorax. The bones are under mineralized, there is degenerative change in the spine.  IMPRESSION: No acute pulmonary process. The previous questioned suprahilar lung opacity is no longer seen.   Impression  1. Minimally abnormal troponin I in relatively flat pattern, not consistent with ACS. Patient presents with confusion, stable intermittent shortness of breath, no chest pain. ECG shows ventricular pacing with underlying chronic atrial fibrillation.  2. History of cardiomyopathy, LVEF 40-45% as of August 2015, status post St. Jude CRT-P.  3. Chronic atrial fibrillation, on Coumadin.  4. Reportedly moderate to severe aortic stenosis by outside echocardiogram in August 2015. Cardiac murmur is not particularly impressive however.  5. Presentation  with confusion, question dementia or occult infection, workup pending per primary team.  Recommendations  Continue cardiac regimen including Cardizem CD, Lasix, lisinopril, Toprol-XL, potassium, Crestor, and Coumadin. Would stop aspirin. Obtain follow-up echocardiogram to reassess LVEF and degree of aortic stenosis for prognostic purposes and to help with potential medication adjustments. At this point ischemic testing is not planned. We will follow with you.  Jonelle Sidle, M.D., F.A.C.C.

## 2014-09-27 NOTE — Progress Notes (Signed)
ANTICOAGULATION CONSULT NOTE - Initial Consult  Pharmacy Consult for Warfarin Indication: atrial fibrillation  No Known Allergies  Patient Measurements: Height: 5\' 4"  (162.6 cm) Weight: 145 lb 11.2 oz (66.089 kg) IBW/kg (Calculated) : 54.7  Vital Signs: Temp: 98.1 F (36.7 C) (03/12 0800) Temp Source: Oral (03/12 0800) BP: 167/72 mmHg (03/12 0800) Pulse Rate: 60 (03/12 0800)  Labs:  Recent Labs  09/27/14 0248 09/27/14 0255 09/27/14 0520  HGB 13.2  --   --   HCT 41.1  --   --   PLT 163  --   --   LABPROT 38.1*  --   --   INR 3.84*  --   --   CREATININE 1.23*  --   --   TROPONINI  --  0.12* 0.11*    Estimated Creatinine Clearance: 30.7 mL/min (by C-G formula based on Cr of 1.23).   Medical History: Past Medical History  Diagnosis Date  . CHF (congestive heart failure)   . DM (diabetes mellitus)   . Dyslipidemia   . Thyroid goiter   . A-fib   . Osteoarthritis   . Hypertension      Assessment: 81yo female w/ h/o Afib, CHF, HTN, T2DM who presented to ED w/ increased confusion over the past month, now experiencing hallucinations.  On warfarin PTA for Afib.  Pharmacy consulted to dose warfarin while admitted.  PTA warfarin dose reported as 3mg  Tuesday and Friday, 6mg  all other days. Pt reported taking last dose on 3/11 prior to arrival at ED INR on admit is supratherapeutic at 3.84, CBC WNL, no bleeding reported  Goal of Therapy:  INR 2-3 Monitor platelets by anticoagulation protocol: Yes   Plan:  Hold warfarin today d/t supratherapeutic INR Monitor daily INR, Q72h CBC, s/sx of bleeding  Waynette Buttery, PharmD Clinical Pharmacy Resident Pager: [phone] 09/27/2014 8:55 AM

## 2014-09-27 NOTE — ED Provider Notes (Signed)
CSN: [id number]     Arrival date & time 09/27/14  0115 History  This chart was scribed for Richardean Canal, MD by Tanda Rockers, ED Scribe. This patient was seen in room A13C/A13C and the patient's care was started at 2:45 AM.    Chief Complaint  Patient presents with  . Altered Mental Status   The history is provided by the patient and a relative. No language interpreter was used.     HPI Comments: [name] is a 81 y.o. female with past medical history of CHF, DM, A Fib, and HTN who presents to the Emergency Department complaining of altered mental status that began 1 month ago. Family states that pt has not been sleeping well and having nightmares. Family reports that pt was stating that she was seeing a snake running through the house earlier tonight. Family mentions that pt was also diaphoretic earlier tonight, causing them concern due to her heart conditions. Pt was seen by Cardiologist, Dr. Graciela Husbands, on 09/23/14, and had her Diltiazem increased and her Metoprolol decreased. She also had her pacemaker checked at that time. Pt denies chest pain or any other symptoms. Family denies fever, cough, abdominal pain, or loss of appetite. Family does mention that pt lives in [place] and has had actual snakes in her house before.    Cardiologist - Dr. Graciela Husbands   Past Medical History  Diagnosis Date  . CHF (congestive heart failure)   . DM (diabetes mellitus)   . Dyslipidemia   . Thyroid goiter   . A-fib   . Osteoarthritis   . Hypertension    Past Surgical History  Procedure Laterality Date  . Papcemaker  01/2014  . Cardiac catheterization    . Abdominal hysterectomy     Family History  Problem Relation Age of Onset  . Hypertension Brother    History  Substance Use Topics  . Smoking status: Never Smoker   . Smokeless tobacco: Not on file  . Alcohol Use: No   OB History    No data available     Review of Systems  Constitutional: Positive for diaphoresis. Negative for fever.   Respiratory: Negative for cough.   Cardiovascular: Negative for chest pain.  Gastrointestinal: Negative for abdominal pain.  Psychiatric/Behavioral: Positive for hallucinations.  All other systems reviewed and are negative.     Allergies  Review of patient's allergies indicates no known allergies.  Home Medications   Prior to Admission medications   Medication Sig Start Date End Date Taking? Authorizing Provider  calcitRIOL (ROCALTROL) 0.25 MCG capsule TAKE 1 CAPSULE (0.25 MCG TOTAL) BY MOUTH DAILY. 09/23/14  Yes Duke Salvia, MD  diltiazem (CARDIZEM CD) 120 MG 24 hr capsule Take 1 capsule (120 mg total) by mouth 2 (two) times daily. 09/23/14  Yes Duke Salvia, MD  furosemide (LASIX) 20 MG tablet Take 1 tablet (20 mg total) by mouth 2 (two) times daily. 07/22/14  Yes Duke Salvia, MD  glimepiride (AMARYL) 1 MG tablet Take 0.5 mg by mouth daily with breakfast.   Yes Historical Provider, MD  lisinopril (PRINIVIL,ZESTRIL) 20 MG tablet Take 20 mg by mouth daily.   Yes Historical Provider, MD  metoprolol succinate (TOPROL-XL) 50 MG 24 hr tablet Take 1 tablet (50 mg total) by mouth daily. Take with or immediately following a meal. 09/23/14  Yes Duke Salvia, MD  potassium chloride SA (K-DUR,KLOR-CON) 20 MEQ tablet Take 10 mEq by mouth daily. 1/2 tab daily   Yes Historical  Provider, MD  rosuvastatin (CRESTOR) 10 MG tablet Take 10 mg by mouth daily.   Yes Historical Provider, MD  warfarin (COUMADIN) 3 MG tablet Take 3-6 mg by mouth daily. Take 3 mg on Tuesday and Friday then take 6 mg all the other days   Yes Historical Provider, MD  warfarin (COUMADIN) 5 MG tablet Take 2.5-5 mg by mouth daily at 6 PM. Takes 3.0mg  on Tues and Fri Taks 6mg  all other days    Historical Provider, MD   Triage Vitals: BP 175/109 mmHg  Pulse 78  Temp(Src) 98.6 F (37 C) (Oral)  Resp 18  Wt 146 lb (66.225 kg)  SpO2 96%   Physical Exam  Constitutional: She is oriented to person, place, and time. She appears  well-developed and well-nourished. No distress.  HENT:  Head: Normocephalic and atraumatic.  Eyes: Conjunctivae and EOM are normal.  Neck: Neck supple. No tracheal deviation present.  Cardiovascular: Normal rate, regular rhythm and normal heart sounds.   Pulmonary/Chest: Effort normal and breath sounds normal. No respiratory distress.  Musculoskeletal: Normal range of motion.  Neurological: She is alert and oriented to person, place, and time.  Skin: Skin is warm and dry.  Psychiatric: She has a normal mood and affect. Her behavior is normal.  Nursing note and vitals reviewed.   ED Course  Procedures (including critical care time)  DIAGNOSTIC STUDIES: Oxygen Saturation is 96% on RA, normal by my interpretation.    COORDINATION OF CARE: 2:52 AM-Discussed treatment plan which includes CXR, CT Head, CBC, CMP, Troponin, UA, Urine culture with pt at bedside and pt agreed to plan.   Labs Review Labs Reviewed  COMPREHENSIVE METABOLIC PANEL - Abnormal; Notable for the following:    Glucose, Bld 140 (*)    Creatinine, Ser 1.23 (*)    GFR calc non Af Amer 39 (*)    GFR calc Af Amer 45 (*)    All other components within normal limits  TROPONIN I - Abnormal; Notable for the following:    Troponin I 0.12 (*)    All other components within normal limits  I-STAT TROPOININ, ED - Abnormal; Notable for the following:    Troponin i, poc 0.11 (*)    All other components within normal limits  URINE CULTURE  CBC WITH DIFFERENTIAL/PLATELET  URINALYSIS, ROUTINE W REFLEX MICROSCOPIC    Imaging Review Ct Head Wo Contrast  09/27/2014   CLINICAL DATA:  Acute onset of altered mental status. Initial encounter.  EXAM: CT HEAD WITHOUT CONTRAST  TECHNIQUE: Contiguous axial images were obtained from the base of the skull through the vertex without intravenous contrast.  COMPARISON:  CT of the head performed 08/04/2014  FINDINGS: There is no evidence of acute infarction, mass lesion, or intra- or  extra-axial hemorrhage on CT.  Prominence of the ventricles and sulci reflects moderate cortical volume loss. Mild cerebellar atrophy is noted. Mild periventricular and subcortical white matter change likely reflects small vessel ischemic microangiopathy. A chronic infarct is noted within the left cerebellar hemisphere. A chronic lacunar infarct is also seen at the left basal ganglia.  The brainstem and fourth ventricle are within normal limits. The cerebral hemispheres demonstrate grossly normal gray-white differentiation. No mass effect or midline shift is seen.  There is no evidence of fracture; visualized osseous structures are unremarkable in appearance. The orbits are within normal limits. The paranasal sinuses and mastoid air cells are well-aerated. No significant soft tissue abnormalities are seen.  IMPRESSION: 1. No acute intracranial pathology seen on  CT. 2. Moderate cortical volume loss and scattered small vessel ischemic microangiopathy. 3. 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 Chronic lacunar infarct at the left basal ganglia.   Electronically Signed   By: Roanna Raider M.D.   On: 09/27/2014 03:41     EKG Interpretation   Date/Time:  Saturday September 27 2014 03:52:10 EST Ventricular Rate:  60 PR Interval:    QRS Duration: 146 QT Interval:  446 QTC Calculation: 446 R Axis:   -79 Text Interpretation:  Junctional rhythm LVH with IVCD, LAD and secondary  repol abnrm Inferior infarct, acute (RCA) Lateral leads are also involved  Probable RV involvement, suggest recording right precordial leads No  significant change since last tracing Confirmed by YAO  MD, DAVID ([id number])  on 09/27/2014 3:58:04 AM      MDM   Final diagnoses:  Confusion   [name] is a 81 y.o. female here with visual hallucination, diaphoresis. Consider ACS vs dementia vs infection. Will get labs, UA, CXR, CT head, EKG.   4:16 AM EKG paced. Trop mildly positive. Discussed with cardiology. Given  no chest pain, recommend trending troponin. Will hold heparin right now.  Will admit.     I personally performed the services described in this documentation, which was scribed in my presence. The recorded information has been reviewed and is accurate.     Richardean Canal, MD 09/27/14 [phone]

## 2014-09-27 NOTE — ED Notes (Signed)
Pt's daughter st's pt has recently has some changes in medication.  St's pt has not been resting.  Tonight pt st's she saw a snake running through the house.  Pt was scared and hyperventilating.  Pt alert and oriented  At this time.

## 2014-09-27 NOTE — H&P (Signed)
[facility] Admission History and Physical       [name] YNW:[id number] DOB: Mar 18, 1927 DOA: 09/27/2014  Referring physician: EDP PCP: Clelia Schaumann, MD  Specialists:   Chief Complaint: Confusion  HPI: [name] is a 81 y.o. female with a history of Atrial Fibrillation, CHF, HTN, DM2 who presents to the ED with complaints of increased confusion over the past month noticed by here daughters since she moved from [place] to live with her daughters.   She was brought to the ED this evening because of her hallucination , that she was seeing "black snakes'" in the corner in the house.   In the ED, she was found to have a positive troponin of 0.12,  However she denies any chest pain.     Review of Systems: Unable to Obtain from the Patient  Past Medical History  Diagnosis Date  . CHF (congestive heart failure)   . DM (diabetes mellitus)   . Dyslipidemia   . Thyroid goiter   . A-fib   . Osteoarthritis   . Hypertension     Past Surgical History  Procedure Laterality Date  . Papcemaker  01/2014  . Cardiac catheterization    . Abdominal hysterectomy       Prior to Admission medications   Medication Sig Start Date End Date Taking? Authorizing Provider  calcitRIOL (ROCALTROL) 0.25 MCG capsule TAKE 1 CAPSULE (0.25 MCG TOTAL) BY MOUTH DAILY. 09/23/14  Yes Duke Salvia, MD  diltiazem (CARDIZEM CD) 120 MG 24 hr capsule Take 1 capsule (120 mg total) by mouth 2 (two) times daily. 09/23/14  Yes Duke Salvia, MD  furosemide (LASIX) 20 MG tablet Take 1 tablet (20 mg total) by mouth 2 (two) times daily. 07/22/14  Yes Duke Salvia, MD  glimepiride (AMARYL) 1 MG tablet Take 0.5 mg by mouth daily with breakfast.   Yes Historical Provider, MD  lisinopril (PRINIVIL,ZESTRIL) 20 MG tablet Take 20 mg by mouth daily.   Yes Historical Provider, MD  metoprolol succinate (TOPROL-XL) 50 MG 24 hr tablet Take 1 tablet (50 mg total) by mouth daily. Take with or immediately  following a meal. 09/23/14  Yes Duke Salvia, MD  potassium chloride SA (K-DUR,KLOR-CON) 20 MEQ tablet Take 10 mEq by mouth daily. 1/2 tab daily   Yes Historical Provider, MD  rosuvastatin (CRESTOR) 10 MG tablet Take 10 mg by mouth daily.   Yes Historical Provider, MD  warfarin (COUMADIN) 3 MG tablet Take 3-6 mg by mouth daily. Take 3 mg on Tuesday and Friday then take 6 mg all the other days   Yes Historical Provider, MD  warfarin (COUMADIN) 5 MG tablet Take 2.5-5 mg by mouth daily at 6 PM. Takes 3.0mg  on Tues and Fri Taks  all other days    Historical Provider, MD     No Known Allergies  Social History:  reports that she has never smoked. She does not have any smokeless tobacco history on file. She reports that she does not drink alcohol. Her drug history is not on file.    Family History  Problem Relation Age of Onset  . Hypertension Brother        Physical Exam:  GEN:  Pleasant Elderly 81 y.o. African American  female examined and in no acute distress; cooperative with exam Filed Vitals:   09/27/14 0607 09/27/14 0611 09/27/14 0615 09/27/14 0700  BP: 162/100 170/90 172/74 163/74  Pulse: 65 61 66 60  Temp: 97.8 F (36.6  C)     TempSrc: Oral     Resp: 18 24 27 27   Height: 5\' 4"  (1.626 m)     Weight: 66.089 kg (145 lb 11.2 oz)     SpO2: 97% 97% 93% 94%   Blood pressure 163/74, pulse 60, temperature 97.8 F (36.6 C), temperature source Oral, resp. rate 27, height 5\' 4"  (1.626 m), weight 66.089 kg (145 lb 11.2 oz), SpO2 94 %. PSYCH: She is alert and oriented x1; does not appear anxious does not appear depressed; affect is normal HEENT: Normocephalic and Atraumatic, Mucous membranes pink; PERRLA; EOM intact; Fundi:  Benign;  No scleral icterus, Nares: Patent, Oropharynx: Clear, Fair Dentition,    Neck:  FROM, No Cervical Lymphadenopathy nor Thyromegaly or Carotid Bruit; No JVD; Breasts:: Not examined CHEST WALL: No tenderness CHEST: Normal respiration, clear to auscultation  bilaterally HEART: Regular rate and rhythm; no murmurs rubs or gallops BACK: No kyphosis or scoliosis; No CVA tenderness ABDOMEN: Positive Bowel Sounds,  Soft Non-Tender, No Rebound or Guarding; No Masses, No Organomegaly. Rectal Exam: Not done EXTREMITIES: No Cyanosis, Clubbing, or Edema; No Ulcerations. Genitalia: not examined PULSES: 2+ and symmetric SKIN: Normal hydration no rash or ulceration CNS:  Alert and Oriented x 2, No Focal Deficits Vascular: pulses palpable throughout    Labs on Admission:  Basic Metabolic Panel:  Recent Labs Lab 09/27/14 0248  NA 140  K 3.9  CL 107  CO2 28  GLUCOSE 140*  BUN 22  CREATININE 1.23*  CALCIUM 10.5   Liver Function Tests:  Recent Labs Lab 09/27/14 0248  AST 23  ALT 22  ALKPHOS 92  BILITOT 0.6  PROT 7.0  ALBUMIN 3.6   No results for input(s): LIPASE, AMYLASE in the last 168 hours. No results for input(s): AMMONIA in the last 168 hours. CBC:  Recent Labs Lab 09/27/14 0248  WBC 5.5  NEUTROABS 3.6  HGB 13.2  HCT 41.1  MCV 92.4  PLT 163   Cardiac Enzymes:  Recent Labs Lab 09/27/14 0255 09/27/14 0520  TROPONINI 0.12* 0.11*    BNP (last 3 results) No results for input(s): BNP in the last 8760 hours.  ProBNP (last 3 results) No results for input(s): PROBNP in the last 8760 hours.  CBG: No results for input(s): GLUCAP in the last 168 hours.  Radiological Exams on Admission: Dg Chest 2 View  09/27/2014   CLINICAL DATA:  81 year old female with confusion. Altered mental status.  EXAM: CHEST  2 VIEW  COMPARISON:  08/04/2014  FINDINGS: Dual lead left-sided pacemaker remains in place. There is unchanged eventration of right hemidiaphragm. Mild cardiomegaly is unchanged allowing for differences in technique. No pulmonary edema. The previous right suprahilar opacity is no longer seen. There is no pleural effusion or pneumothorax. The bones are under mineralized, there is degenerative change in the spine.  IMPRESSION:  No acute pulmonary process. The previous questioned suprahilar lung opacity is no longer seen.   Electronically Signed   By: Rubye Oaks M.D.   On: 09/27/2014 04:16   Ct Head Wo Contrast  09/27/2014   CLINICAL DATA:  Acute onset of altered mental status. Initial encounter.  EXAM: CT HEAD WITHOUT CONTRAST  TECHNIQUE: Contiguous axial images were obtained from the base of the skull through the vertex without intravenous contrast.  COMPARISON:  CT of the head performed 08/04/2014  FINDINGS: There is no evidence of acute infarction, mass lesion, or intra- or extra-axial hemorrhage on CT.  Prominence of the ventricles and sulci reflects  moderate cortical volume loss. Mild cerebellar atrophy is noted. Mild periventricular and subcortical white matter change likely reflects small vessel ischemic microangiopathy. A chronic infarct is noted within the left cerebellar hemisphere. A chronic lacunar infarct is also seen at the left basal ganglia.  The brainstem and fourth ventricle are within normal limits. The cerebral hemispheres demonstrate grossly normal gray-white differentiation. No mass effect or midline shift is seen.  There is no evidence of fracture; visualized osseous structures are unremarkable in appearance. The orbits are within normal limits. The paranasal sinuses and mastoid air cells are well-aerated. No significant soft tissue abnormalities are seen.  IMPRESSION: 1. No acute intracranial pathology seen on CT. 2. Moderate cortical volume loss and scattered small vessel ischemic microangiopathy. 3. 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 Chronic lacunar infarct at the left basal ganglia.   Electronically Signed   By: Roanna Raider M.D.   On: 09/27/2014 03:41     EKG: Independently reviewed.    Assessment/Plan:   81 y.o. female with   Principal Problem:   1.   NSTEMI (non-ST elevated myocardial infarction)/Elevated troponin  VS Demand Ischemia   Cardiac Monitoring   Cycle  Troponins   IV NTG   Cards Consulted        Active Problems:   2.   Confusion-  Probable Dementia with Episodes of Sundowning   Dementia Work up initiated    Check TSH, B12, Folate, RPR    CT scan of Head without Acute Findings     3.   CHF (congestive heart failure)   Stable   Continue Lasix, Metoprolol and                 4.   Dyslipidemia   Continue Crestor     5.   A-fib   Continue Diltiazem   Continue Metoprolol   Continue Coumadin Rx       6.   Hypertension   Continue Diltiazem, and Metoprolol Rx.        7.   DM (diabetes mellitus)   SSI coverage PRN     8.   DVT Prophylaxis    On Coumadin Rx         Code Status:     FULL CODE     Family Communication:   Family at Bedside     Disposition Plan:    Inpatient Status        Time spent:  54 Minutes      Ron Parker [facility] Pager [phone]   If 7AM -7PM Please Contact the Day Rounding Team MD for [facility]  If 7PM-7AM, Please Contact Night-Floor Coverage  [email or web address] Password [facility] 09/27/2014, 7:27 AM     ADDENDUM:   Patient was seen and examined on 09/27/2014

## 2014-09-27 NOTE — Progress Notes (Signed)
Patient admitted after midnight.  Please see H&P. NSTEMI (non-ST elevated myocardial infarction)/Elevated troponin VS Demand Ischemia- suspect demand but no source of infection found yet, HR controlled Cardiac Monitoring Cycle Troponins IV NTG                         INR elevated Cards Consulted per ER doc   Confusion- Probable Dementia with Episodes of Sundowning Dementia Work up initiated   -await urine Check TSH, B12, Folate, RPR CT scan of Head without Acute Findings   CHF (congestive heart failure) Stable Continue Lasix, Metoprolol      Dyslipidemia Continue Crestor   A-fib Continue Diltiazem Continue Metoprolol Continue Coumadin Rx    Hypertension Continue Diltiazem, and Metoprolol Rx.     DM (diabetes mellitus) SSI coverage PRN  Lives with family at home  Marlin Canary DO

## 2014-09-27 NOTE — Progress Notes (Signed)
Notified Md of pt's bp 189/83.  New orders received. Will continue to monitor. Karena Addison T

## 2014-09-28 DIAGNOSIS — I482 Chronic atrial fibrillation: Secondary | ICD-10-CM

## 2014-09-28 LAB — BASIC METABOLIC PANEL
Anion gap: 4 — ABNORMAL LOW (ref 5–15)
BUN: 17 mg/dL (ref 6–23)
CO2: 28 mmol/L (ref 19–32)
Calcium: 10.1 mg/dL (ref 8.4–10.5)
Chloride: 111 mmol/L (ref 96–112)
Creatinine, Ser: 1.09 mg/dL (ref 0.50–1.10)
GFR calc Af Amer: 52 mL/min — ABNORMAL LOW (ref >90)
GFR, EST NON AFRICAN AMERICAN: 45 mL/min — AB (ref >90)
GLUCOSE: 131 mg/dL — AB (ref 70–99)
Potassium: 3.6 mmol/L (ref 3.5–5.1)
Sodium: 143 mmol/L (ref 135–145)

## 2014-09-28 LAB — URINE CULTURE: Colony Count: 50000

## 2014-09-28 LAB — HIV ANTIBODY (ROUTINE TESTING W REFLEX): HIV SCREEN 4TH GENERATION: NONREACTIVE

## 2014-09-28 LAB — CBC
HEMATOCRIT: 41.5 % (ref 36.0–46.0)
Hemoglobin: 13.2 g/dL (ref 12.0–15.0)
MCH: 29.3 pg (ref 26.0–34.0)
MCHC: 31.8 g/dL (ref 30.0–36.0)
MCV: 92.2 fL (ref 78.0–100.0)
Platelets: 178 10*3/uL (ref 150–400)
RBC: 4.5 MIL/uL (ref 3.87–5.11)
RDW: 14.5 % (ref 11.5–15.5)
WBC: 6.7 10*3/uL (ref 4.0–10.5)

## 2014-09-28 LAB — PROTIME-INR
INR: 3.21 — ABNORMAL HIGH (ref 0.00–1.49)
Prothrombin Time: 33.1 seconds — ABNORMAL HIGH (ref 11.6–15.2)

## 2014-09-28 LAB — GLUCOSE, CAPILLARY
GLUCOSE-CAPILLARY: 229 mg/dL — AB (ref 70–99)
Glucose-Capillary: 169 mg/dL — ABNORMAL HIGH (ref 70–99)

## 2014-09-28 LAB — RPR: RPR Ser Ql: NONREACTIVE

## 2014-09-28 MED ORDER — WARFARIN VIDEO: Freq: Once | Status: DC

## 2014-09-28 MED ORDER — LORAZEPAM 2 MG/ML IJ SOLN
0.5000 mg | Freq: Once | INTRAMUSCULAR | Status: AC
  Administered 2014-09-28: 0.5 mg via INTRAVENOUS
  Filled 2014-09-28: qty 1

## 2014-09-28 MED ORDER — HALOPERIDOL 0.5 MG PO TABS
0.5000 mg | ORAL_TABLET | Freq: Four times a day (QID) | ORAL | Status: DC | PRN
  Administered 2014-09-28: 0.5 mg via ORAL
  Filled 2014-09-28 (×2): qty 1

## 2014-09-28 MED ORDER — HALOPERIDOL LACTATE 5 MG/ML IJ SOLN
1.0000 mg | Freq: Once | INTRAMUSCULAR | Status: AC
  Administered 2014-09-28: 1 mg via INTRAVENOUS
  Filled 2014-09-28: qty 0.2

## 2014-09-28 MED ORDER — WARFARIN SODIUM 2 MG PO TABS
2.0000 mg | ORAL_TABLET | Freq: Once | ORAL | Status: DC
  Filled 2014-09-28: qty 1

## 2014-09-28 NOTE — Progress Notes (Signed)
Pt is now resting quietly with eyes closed. No apparent distress. Daughter at bedside.

## 2014-09-28 NOTE — Progress Notes (Signed)
Patient became very agitated and paranoid. She stated that she had to use the bathroom and then was very difficult to get back to the chair or bed. She was swinging at staff and yelling. Dr. Benjamine Mola notified and order received for haldol IV; given. Pt slightly calmer and we were able to get her into the bed. Bed alarm on and daughter and son at bedside. Her children have been encouraged to provide a calm environment, lights lowered and noise reduced.

## 2014-09-28 NOTE — Progress Notes (Signed)
Pt received to room 2W10. She is alert but disoriented to time, place and situation. She is quite busy. Placed in recliner chair with chair alarm, call bell in reach, and instructions to call for assistance. She is unable to provide teachback. Word search and coloring pages supplied for distraction. Son in room.

## 2014-09-28 NOTE — Progress Notes (Signed)
Utilization Review Completed.   Abdelrahman Nair, RN, BSN Nurse Case Manager  

## 2014-09-28 NOTE — Progress Notes (Signed)
Pt continues to be confused, paranoid and combative when try to take vital signs, toilet patient of check blood sugar. Her heart rate goes up to 160's when she is fighting [place] and returns to 80's when we are not interacting with her. She has had a bottle of water and 2 cups of water, but has not eaten today. Also patient's daughter states that she has heard bad things about haldol and does not want her mother to get this med. Dr. Benjamine Mola notified of Patient's continued agitation, inability to obtain blood pressure, CBG or administer any oral meds, and daughter's request not to use haldol. Order received for small trial dose of ativan.

## 2014-09-28 NOTE — Progress Notes (Signed)
Pt continues to be uncooperative and combative with any attempts at care. Instructed family to avoid arguing with and trying to reason with patient as this is further agitating her. Oral dose of haldol given after 30 minutes of convincing pt to take it.

## 2014-09-28 NOTE — Progress Notes (Signed)
PROGRESS NOTE  [name] ZOX:[id number] DOB: 1926-12-05 DOA: 09/27/2014 PCP: Clelia Schaumann, MD  [name] is a 81 y.o. female with a history of Atrial Fibrillation, CHF, HTN, DM2 who presents to the ED with complaints of increased confusion over the past month noticed by here daughters since she moved from [place] to live with her daughters. She was brought to the ED this evening because of her hallucination , that she was seeing "black snakes'" in the corner in the house. In the ED, she was found to have a positive troponin of 0.12, However she denies any chest pain  Assessment/Plan:  Confusion- Probable Dementia with Episodes of Sundowning/delerium Dementia Work up initiated -urine ok TSH, B12, Folate, RPR ok CT scan of Head without Acute Findings- white matter changes   -family states episodes of confusion since she moved from [facility] for her pacemaker this past summer   - not sure MRI would help and do not think patient would cooperate  Elevated troponin Cardiac Monitoring Cycle Troponins IV NTG  INR elevated Cards Consulted per ER doc- not cardiac   CHF (congestive heart failure) Stable Continue Lasix, Metoprolol     Dyslipidemia Continue Crestor  A-fib Continue Diltiazem Continue Metoprolol Continue Coumadin Rx  Hypertension Continue Diltiazem, and Metoprolol Rx.    DM (diabetes mellitus) SSI coverage PRN   Code Status: full Family Communication:  family at bedside Disposition Plan: tx to tele floor   Consultants:  cards  Procedures:     HPI/Subjective: Son at bedside says she was back to normal until about 5:30 this AM and since then has been confused- refusing breakfast as she says it is not hers  Objective: Filed Vitals:   09/28/14 0318  BP: 145/45  Pulse:   Temp: 98.5 F (36.9 C)  Resp: 25    Intake/Output Summary (Last 24 hours) at 09/28/14 0754 Last data filed at 09/28/14 0700  Gross per 24 hour  Intake   1580 ml  Output   2650 ml  Net  -1070 ml   Filed Weights   09/27/14 0124 09/27/14 0607  Weight: 66.225 kg (146 lb) 66.089 kg (145 lb 11.2 oz)    Exam:   General:  Oriented to time, person but not place  Cardiovascular: rrr  Respiratory: clear  Abdomen: +BS  Musculoskeletal: no edema   Data Reviewed: Basic Metabolic Panel:  Recent Labs Lab 09/27/14 0248 09/28/14 0330  NA 140 143  K 3.9 3.6  CL 107 111  CO2 28 28  GLUCOSE 140* 131*  BUN 22 17  CREATININE 1.23* 1.09  CALCIUM 10.5 10.1   Liver Function Tests:  Recent Labs Lab 09/27/14 0248  AST 23  ALT 22  ALKPHOS 92  BILITOT 0.6  PROT 7.0  ALBUMIN 3.6   No results for input(s): LIPASE, AMYLASE in the last 168 hours. No results for input(s): AMMONIA in the last 168 hours. CBC:  Recent Labs Lab 09/27/14 0248 09/28/14 0330  WBC 5.5 6.7  NEUTROABS 3.6  --   HGB 13.2 13.2  HCT 41.1 41.5  MCV 92.4 92.2  PLT 163 178   Cardiac Enzymes:  Recent Labs Lab 09/27/14 0255 09/27/14 0520 09/27/14 1150 09/27/14 1635  TROPONINI 0.12* 0.11* 0.11* 0.10*   BNP (last 3 results) No results for input(s): BNP in the last 8760 hours.  ProBNP (last 3 results) No results for input(s): PROBNP in the last 8760 hours.  CBG:  Recent Labs Lab 09/27/14 0813 09/27/14 1147 09/27/14  1610 09/27/14 2111  GLUCAP 116* 185* 210* 158*    Recent Results (from the past 240 hour(s))  MRSA PCR Screening     Status: None    Collection Time: 09/27/14  6:00 AM  Result Value Ref Range Status   MRSA by PCR NEGATIVE NEGATIVE Final    Comment:        The GeneXpert MRSA Assay (FDA approved for NASAL specimens only), is one component of a comprehensive MRSA colonization surveillance program. It is not intended to diagnose MRSA infection nor to guide or monitor treatment for MRSA infections.      Studies: Dg Chest 2 View  09/27/2014   CLINICAL DATA:  81 year old female with confusion. Altered mental status.  EXAM: CHEST  2 VIEW  COMPARISON:  08/04/2014  FINDINGS: Dual lead left-sided pacemaker remains in place. There is unchanged eventration of right hemidiaphragm. Mild cardiomegaly is unchanged allowing for differences in technique. No pulmonary edema. The previous right suprahilar opacity is no longer seen. There is no pleural effusion or pneumothorax. The bones are under mineralized, there is degenerative change in the spine.  IMPRESSION: No acute pulmonary process. The previous questioned suprahilar lung opacity is no longer seen.   Electronically Signed   By: Rubye Oaks M.D.   On: 09/27/2014 04:16   Ct Head Wo Contrast  09/27/2014   CLINICAL DATA:  Acute onset of altered mental status. Initial encounter.  EXAM: CT HEAD WITHOUT CONTRAST  TECHNIQUE: Contiguous axial images were obtained from the base of the skull through the vertex without intravenous contrast.  COMPARISON:  CT of the head performed 08/04/2014  FINDINGS: There is no evidence of acute infarction, mass lesion, or intra- or extra-axial hemorrhage on CT.  Prominence of the ventricles and sulci reflects moderate cortical volume loss. Mild cerebellar atrophy is noted. Mild periventricular and subcortical white matter change likely reflects small vessel ischemic microangiopathy. A chronic infarct is noted within the left cerebellar hemisphere. A chronic lacunar infarct is also seen at the left basal ganglia.  The brainstem and fourth ventricle are within  normal limits. The cerebral hemispheres demonstrate grossly normal gray-white differentiation. No mass effect or midline shift is seen.  There is no evidence of fracture; visualized osseous structures are unremarkable in appearance. The orbits are within normal limits. The paranasal sinuses and mastoid air cells are well-aerated. No significant soft tissue abnormalities are seen.  IMPRESSION: 1. No acute intracranial pathology seen on CT. 2. Moderate cortical volume loss and scattered small vessel ischemic microangiopathy. 3. 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 Chronic lacunar infarct at the left basal ganglia.   Electronically Signed   By: Roanna Raider M.D.   On: 09/27/2014 03:41    Scheduled Meds: . aspirin  325 mg Oral Daily  . calcitRIOL  0.25 mcg Oral Daily  . diltiazem  120 mg Oral BID  . furosemide  20 mg Oral BID  . insulin aspart  0-15 Units Subcutaneous TID WC  . insulin aspart  0-5 Units Subcutaneous QHS  . lisinopril  20 mg Oral Daily  . metoprolol succinate  50 mg Oral Daily  . nitroGLYCERIN  0.5 inch Topical 4 times per day  . potassium chloride SA  10 mEq Oral Daily  . rosuvastatin  10 mg Oral Daily  . Warfarin - Pharmacist Dosing Inpatient   Does not apply q1800   Continuous Infusions: . sodium chloride 50 mL (09/28/14 0450)   Antibiotics Given (last 72 hours)    None  Principal Problem:   NSTEMI (non-ST elevated myocardial infarction) Active Problems:   Elevated troponin   Confusion   CHF (congestive heart failure)   Dyslipidemia   A-fib   Hypertension   DM (diabetes mellitus)   Troponin level elevated    Time spent: 25 min    Ricardo Kayes  [facility] Pager [phone]. If 7PM-7AM, please contact night-coverage at [email or web address], password [email or web address] 09/28/2014, 7:54 AM  LOS: 1 day

## 2014-09-28 NOTE — Progress Notes (Signed)
ANTICOAGULATION CONSULT NOTE - Follow-Up Consult  Pharmacy Consult for Warfarin Indication: atrial fibrillation  No Known Allergies  Patient Measurements: Height: 5\' 4"  (162.6 cm) Weight: 145 lb 11.2 oz (66.089 kg) IBW/kg (Calculated) : 54.7  Vital Signs: Temp: 98.5 F (36.9 C) (03/13 0318) Temp Source: Oral (03/13 0318) BP: 145/45 mmHg (03/13 0318)  Labs:  Recent Labs  09/27/14 0248  09/27/14 0520 09/27/14 1150 09/27/14 1635 09/28/14 0330  HGB 13.2  --   --   --   --  13.2  HCT 41.1  --   --   --   --  41.5  PLT 163  --   --   --   --  178  LABPROT 38.1*  --   --   --   --  33.1*  INR 3.84*  --   --   --   --  3.21*  CREATININE 1.23*  --   --   --   --  1.09  TROPONINI  --   < > 0.11* 0.11* 0.10*  --   < > = values in this interval not displayed.  Estimated Creatinine Clearance: 34.7 mL/min (by C-G formula based on Cr of 1.09).   Medical History: Past Medical History  Diagnosis Date  . Cardiomyopathy     LVEF 40-45% August 2015  . Type 2 diabetes mellitus   . Dyslipidemia   . Thyroid goiter   . Atrial fibrillation     Coumadin  . Osteoarthritis   . Essential hypertension   . Aortic stenosis     Moderate to severe August 2015      Assessment: 81yo female w/ h/o Afib, CHF, HTN, T2DM who presented to ED w/ increased confusion over the past month, then began experiencing hallucinations.  On warfarin PTA for Afib.  Pharmacy consulted to dose warfarin while admitted.  PTA warfarin dose reported as 3mg  Tuesday and Friday, 6mg  all other days INR on admit was supratherapeutic at 3.84. INR remains supratherapeutic but came down significantly, now 3.21, CBC WNL, no bleeding reported. Will give small dose tonight to prevent INR from dropping to subtherapeutic range.  Goal of Therapy:  INR 2-3 Monitor platelets by anticoagulation protocol: Yes   Plan:  Warfarin 2 mg x1 Monitor daily INR, Q72h CBC, s/sx of bleeding  Waynette Buttery, PharmD Clinical Pharmacy  Resident Pager: [phone] 09/28/2014 8:46 AM

## 2014-09-29 DIAGNOSIS — I5032 Chronic diastolic (congestive) heart failure: Secondary | ICD-10-CM

## 2014-09-29 DIAGNOSIS — I350 Nonrheumatic aortic (valve) stenosis: Secondary | ICD-10-CM

## 2014-09-29 LAB — GLUCOSE, CAPILLARY
GLUCOSE-CAPILLARY: 154 mg/dL — AB (ref 70–99)
GLUCOSE-CAPILLARY: 151 mg/dL — AB (ref 70–99)
Glucose-Capillary: 160 mg/dL — ABNORMAL HIGH (ref 70–99)
Glucose-Capillary: 239 mg/dL — ABNORMAL HIGH (ref 70–99)
Glucose-Capillary: 183 mg/dL — ABNORMAL HIGH (ref 70–99)

## 2014-09-29 LAB — FOLATE RBC
Folate, Hemolysate: 376.2 ng/mL
Folate, RBC: 943 ng/mL (ref >498)
Hematocrit: 39.9 % (ref 34.0–46.6)

## 2014-09-29 MED ORDER — WARFARIN SODIUM 2 MG PO TABS
2.0000 mg | ORAL_TABLET | Freq: Once | ORAL | Status: AC
Start: 2014-09-29 — End: 2014-09-29
  Administered 2014-09-29: 2 mg via ORAL
  Filled 2014-09-29: qty 1

## 2014-09-29 NOTE — Progress Notes (Signed)
       Patient Name: [name] Date of Encounter: 09/29/2014    SUBJECTIVE: Refusing laboratory blood draws. Denies cardiopulmonary symptoms. Her daughters in the room with her. Some level of confusion is present.  TELEMETRY:  Chronic atrial fibrillation Filed Vitals:   09/28/14 0730 09/28/14 1042 09/28/14 1952 09/29/14 0446  BP: 176/82 176/80 103/61 92/71  Pulse:   78 81  Temp:   98.5 F (36.9 C) 98.1 F (36.7 C)  TempSrc:   Oral Oral  Resp: 15  14 16   Height:      Weight:      SpO2:   100% 100%   No intake or output data in the 24 hours ending 09/29/14 0932 LABS: Basic Metabolic Panel:  Recent Labs  62/70/35 0248 09/28/14 0330  NA 140 143  K 3.9 3.6  CL 107 111  CO2 28 28  GLUCOSE 140* 131*  BUN 22 17  CREATININE 1.23* 1.09  CALCIUM 10.5 10.1   CBC:  Recent Labs  09/27/14 0248 09/28/14 0330  WBC 5.5 6.7  NEUTROABS 3.6  --   HGB 13.2 13.2  HCT 41.1 41.5  MCV 92.4 92.2  PLT 163 178   Cardiac Enzymes:  Recent Labs  09/27/14 0520 09/27/14 1150 09/27/14 1635  TROPONINI 0.11* 0.11* 0.10*   Radiology/Studies:  no new data  Physical Exam: Blood pressure 92/71, pulse 81, temperature 98.1 F (36.7 C), temperature source Oral, resp. rate 16, height 5\' 4"  (1.626 m), weight 145 lb 11.2 oz (66.089 kg), SpO2 100 %. Weight change:   Wt Readings from Last 3 Encounters:  09/27/14 145 lb 11.2 oz (66.089 kg)  09/23/14 146 lb 1.9 oz (66.28 kg)  08/19/14 148 lb (67.132 kg)    Chest is clear to auscultation and percussion   Cardiac exam reveals a 2/6 systolic murmur. No diastolic murmur.  ASSESSMENT:  50. 81 year old female admitted with confusion and has background history of decreased memory. Likely has dementia. 2. Elevated troponin secondary to demand ischemia. This requires no ischemic workup. 3. Aortic stenosis, with most recent echo suggesting moderate severity in July 2015.  Plan:  A follow-up echo has been ordered. In absence of heart  failure, angina, or syncope, no specific management of the aortic valve disorder is indicated given her age and cognitive impairment.   Please call if we may be of further assistance or if the echo reveals surprising findings.   Selinda Eon 09/29/2014, 9:32 AM

## 2014-09-29 NOTE — Progress Notes (Signed)
Pt awoke this morning and crawled out of bed to use the bathroom.  When coming to her assistance she refused help from staff and requested we leave her alone.  Pt irritable, currently refusing medications and her heart monitor. CCMD notified to put pt on standby.

## 2014-09-29 NOTE — Evaluation (Signed)
Physical Therapy Evaluation/ Discharge Patient Details Name: [name] MRN: [id number] DOB: 1926-11-17 Today's Date: 09/29/2014   History of Present Illness  [name] is a 81 y.o. female with a history of Atrial Fibrillation, CHF, HTN, DM2 who presents to the ED with complaints of increased confusion over the past month noticed by here daughters since she moved from [place] to live with her daughters  Clinical Impression  Pt pleasant sitting in chair at doorway with shirt on, no pants, covered with blanket. Pushed pt chair in room and upon standing noted pt to be soaked in urine, assisted to [facility] pt voided with assist for linen change, pt performed pericare. Pt able to ambulate and perform transfers at baseline with dgtrs present throughout and stating 24hr assist at home. Pt in recliner with feet up end of session and call bell by dgtr. Pt at baseline without further therapy needs with explanation provided to dgtrs who are aware and in agreement, will sign off. Daily ambulation with assist recommended.     Follow Up Recommendations No PT follow up    Equipment Recommendations  None recommended by PT    Recommendations for Other Services       Precautions / Restrictions Precautions Precautions: Fall Restrictions Weight Bearing Restrictions: No      Mobility  Bed Mobility               General bed mobility comments: in chair at door on arrival  Transfers Overall transfer level: Needs assistance   Transfers: Sit to/from Stand Sit to Stand: Min guard         General transfer comment: cues for hand placement and safety  Ambulation/Gait Ambulation/Gait assistance: Min guard Ambulation Distance (Feet): 300 Feet Assistive device: Rolling walker (2 wheeled) Gait Pattern/deviations: Shuffle;Trunk flexed   Gait velocity interpretation: Below normal speed for age/gender General Gait Details: cues for position in RW, looking up and directional cues. Seated  rest halfway through gait  Stairs            Wheelchair Mobility    Modified Rankin (Stroke Patients Only)       Balance Overall balance assessment: Needs assistance   Sitting balance-Leahy Scale: Good       Standing balance-Leahy Scale: Poor                               Pertinent Vitals/Pain Pain Assessment: No/denies pain    Home Living Family/patient expects to be discharged to:: Private residence Living Arrangements: Children Available Help at Discharge: Family;Available 24 hours/day Type of Home: House Home Access: Level entry     Home Layout: One level Home Equipment: Walker - 4 wheels      Prior Function Level of Independence: Needs assistance   Gait / Transfers Assistance Needed: supervision with rollator for gait, ambulates in the home  ADL's / Homemaking Assistance Needed: family does the homemaking, assists with dressing and pt sponge bathes on her own        Hand Dominance        Extremity/Trunk Assessment   Upper Extremity Assessment: Generalized weakness           Lower Extremity Assessment: Generalized weakness      Cervical / Trunk Assessment: Kyphotic  Communication   Communication: No difficulties  Cognition Arousal/Alertness: Awake/alert Behavior During Therapy: WFL for tasks assessed/performed Overall Cognitive Status: History of cognitive impairments - at baseline  General Comments      Exercises        Assessment/Plan    PT Assessment Patent does not need any further PT services  PT Diagnosis Difficulty walking;Generalized weakness;Altered mental status   PT Problem List    PT Treatment Interventions     PT Goals (Current goals can be found in the Care Plan section) Acute Rehab PT Goals Patient Stated Goal: return home PT Goal Formulation: All assessment and education complete, DC therapy    Frequency     Barriers to discharge        Co-evaluation                End of Session   Activity Tolerance: Patient tolerated treatment well Patient left: in chair;with family/visitor present Nurse Communication: Mobility status         Time: 3428-7681 PT Time Calculation (min) (ACUTE ONLY): 29 min   Charges:   PT Evaluation $Initial PT Evaluation Tier I: 1 Procedure PT Treatments $Gait Training: 8-22 mins   PT G CodesDelorse Lek 09/29/2014, 11:26 AM Delaney Meigs, PT [phone]

## 2014-09-29 NOTE — Progress Notes (Signed)
PROGRESS NOTE  TALAR OJEDA NHA:[id number] DOB: 1926-10-09 DOA: 09/27/2014 PCP: Clelia Schaumann, MD  Assessment/Plan:  Confusion- likely progressive dementia with sundowners -urine ok TSH, B12, RPR ok. folate pending CT scan of Head without Acute Findings- white matter changes. Family wishes to hold off on scheduled antipsychotics. D/c telemetry to avoid agitation  Elevated troponin cardiology signed off. Await echo. D/c home in am if unremarkable  CHF (congestive heart failure) compensated Continue Lasix, Metoprolol   Dyslipidemia Continue Crestor  A-fib Continue Diltiazem Continue Metoprolol Continue Coumadin Rx  Hypertension Continue Diltiazem, and Metoprolol Rx.    DM (diabetes mellitus) SSI coverage   Code Status: full Family Communication: family at bedside Disposition Plan:    Consultants:  cards  Procedures:     HPI/Subjective: Per family, cooperative this am with minimal confusion. Patient without complaints.  Objective: Filed Vitals:   09/29/14 0446  BP: 92/71  Pulse: 81  Temp: 98.1 F (36.7 C)  Resp: 16    Intake/Output Summary (Last 24 hours) at 09/29/14 1144 Last data filed at 09/29/14 0943  Gross per 24 hour  Intake    120 ml  Output      0 ml  Net    120 ml   Filed Weights   09/27/14 0124 09/27/14 0607  Weight: 66.225 kg (146 lb) 66.089 kg (145 lb 11.2 oz)    Exam:   General:  In chair, calm and cooperative  Cardiovascular: rrr without mgr  Respiratory: clear without WRR  Abdomen: +BS, s, nt, nd  Musculoskeletal: no edema , CCE  Data Reviewed: Basic Metabolic  Panel:  Recent Labs Lab 09/27/14 0248 09/28/14 0330  NA 140 143  K 3.9 3.6  CL 107 111  CO2 28 28  GLUCOSE 140* 131*  BUN 22 17  CREATININE 1.23* 1.09  CALCIUM 10.5 10.1   Liver Function Tests:  Recent Labs Lab 09/27/14 0248  AST 23  ALT 22  ALKPHOS 92  BILITOT 0.6  PROT 7.0  ALBUMIN 3.6   No results for input(s): LIPASE, AMYLASE in the last 168 hours. No results for input(s): AMMONIA in the last 168 hours. CBC:  Recent Labs Lab 09/27/14 0248 09/28/14 0330  WBC 5.5 6.7  NEUTROABS 3.6  --   HGB 13.2 13.2  HCT 41.1 41.5  MCV 92.4 92.2  PLT 163 178   Cardiac Enzymes:  Recent Labs Lab 09/27/14 0255 09/27/14 0520 09/27/14 1150 09/27/14 1635  TROPONINI 0.12* 0.11* 0.11* 0.10*   BNP (last 3 results) No results for input(s): BNP in the last 8760 hours.  ProBNP (last 3 results) No results for input(s): PROBNP in the last 8760 hours.  CBG:  Recent Labs Lab 09/28/14 0728 09/28/14 1142 09/28/14 2121 09/29/14 0659 09/29/14 1135  GLUCAP 169* 229* 154* 160* 151*    Recent Results (from the past 240 hour(s))  MRSA PCR Screening     Status: None   Collection Time: 09/27/14  6:00 AM  Result Value Ref Range Status   MRSA by PCR NEGATIVE NEGATIVE Final    Comment:        The GeneXpert MRSA Assay (FDA approved for NASAL specimens only), is one component of a comprehensive MRSA colonization surveillance program. It is not intended to diagnose MRSA infection nor to guide or monitor treatment for MRSA infections.   Urine culture     Status: None   Collection Time: 09/27/14 11:57 AM  Result Value Ref Range Status   Specimen Description URINE, CLEAN CATCH  Final   Special Requests NONE  Final   Colony Count   Final    50,000 COLONIES/ML Performed at [facility]    Culture   Final    Multiple bacterial morphotypes present, none predominant. Suggest appropriate recollection if clinically indicated. Performed at [facility]     Report Status 09/28/2014 FINAL  Final     Studies: No results found.  Scheduled Meds: . aspirin  325 mg Oral Daily  . calcitRIOL  0.25 mcg Oral Daily  . diltiazem  120 mg Oral BID  . furosemide  20 mg Oral BID  . insulin aspart  0-15 Units Subcutaneous TID WC  . insulin aspart  0-5 Units Subcutaneous QHS  . lisinopril  20 mg Oral Daily  . metoprolol succinate  50 mg Oral Daily  . nitroGLYCERIN  0.5 inch Topical 4 times per day  . potassium chloride SA  10 mEq Oral Daily  . rosuvastatin  10 mg Oral Daily  . warfarin  2 mg Oral ONCE-1800  . warfarin   Does not apply Once  . Warfarin - Pharmacist Dosing Inpatient   Does not apply q1800   Continuous Infusions:   Antibiotics Given (last 72 hours)    None      Time spent: 25 min  Merleen Picazo L  [facility] [email or web address], password [facility] 09/29/2014, 11:44 AM  LOS: 2 days

## 2014-09-29 NOTE — Progress Notes (Signed)
ANTICOAGULATION CONSULT NOTE - Follow-Up Consult  Pharmacy Consult for Warfarin Indication: atrial fibrillation  No Known Allergies  Patient Measurements: Height: 5\' 4"  (162.6 cm) Weight: 145 lb 11.2 oz (66.089 kg) IBW/kg (Calculated) : 54.7  Vital Signs: Temp: 98.1 F (36.7 C) (03/14 0446) Temp Source: Oral (03/14 0446) BP: 92/71 mmHg (03/14 0446) Pulse Rate: 81 (03/14 0446)  Labs:  Recent Labs  09/27/14 0248  09/27/14 0520 09/27/14 1150 09/27/14 1635 09/28/14 0330  HGB 13.2  --   --   --   --  13.2  HCT 41.1  --   --   --   --  41.5  PLT 163  --   --   --   --  178  LABPROT 38.1*  --   --   --   --  33.1*  INR 3.84*  --   --   --   --  3.21*  CREATININE 1.23*  --   --   --   --  1.09  TROPONINI  --   < > 0.11* 0.11* 0.10*  --   < > = values in this interval not displayed.  Estimated Creatinine Clearance: 34.7 mL/min (by C-G formula based on Cr of 1.09).   Medical History: Past Medical History  Diagnosis Date  . Cardiomyopathy     LVEF 40-45% August 2015  . Type 2 diabetes mellitus   . Dyslipidemia   . Thyroid goiter   . Atrial fibrillation     Coumadin  . Osteoarthritis   . Essential hypertension   . Aortic stenosis     Moderate to severe August 2015      Assessment: 81yo female w/ h/o Afib, CHF, HTN, T2DM who presented to ED w/ increased confusion over the past month, then began experiencing hallucinations.  On warfarin PTA for Afib.  Pharmacy consulted to dose warfarin while admitted.  PTA warfarin dose reported as 3mg  Tuesday and Friday, 6mg  all other days INR on admit was supratherapeutic at 3.84 but trended down to 3.21 yesterday. She was scheduled to get a small dose of Coumadin yesterday to prevent her INR from dropping to sub-therapeutic levels but patient has been refusing all meds and lab draws. No INR today. No bleeding noted.   Goal of Therapy:  INR 2-3 Monitor platelets by anticoagulation protocol: Yes   Plan:  -Repeat Warfarin 2 mg  x1 today. Re-attempt INR tomorrow.  -Monitor daily INR, Q72h CBC, s/sx of bleeding  Vinnie Level, PharmD., BCPS Clinical Pharmacist Pager [phone]

## 2014-09-29 NOTE — Progress Notes (Signed)
OT Cancellation Note  Patient Details Name: KEIGAN SEMONES MRN: [id number] DOB: 07-19-1926   Cancelled Treatment:    Reason Eval/Treat Not Completed: OT screened, no needs identified, will sign off - observed pt with PT.  Per family report pt is at her baseline.  Will sign off.   Angelene Giovanni [place], OTR/L [phone]  09/29/2014, 11:12 AM

## 2014-09-29 NOTE — Progress Notes (Signed)
Examined pt at bedside. VSS. Pt is sitting in chair, somnolent but easily arousable. Lungs- CTAB. Heart- RRR. Abd- +bs, sntnd. Per RN, pt has been sleeping all night until 5 this am when she became irritable, refusing meds and heart monitor. Patient then calmed and is now asleep in the chair  Illa Level [facility]

## 2014-09-29 NOTE — Progress Notes (Signed)
  Echocardiogram 2D Echocardiogram has been performed.  Arvil Chaco 09/29/2014, 2:11 PM

## 2014-09-30 LAB — PROTIME-INR
INR: 1.88 — AB (ref 0.00–1.49)
Prothrombin Time: 21.8 seconds — ABNORMAL HIGH (ref 11.6–15.2)

## 2014-09-30 LAB — GLUCOSE, CAPILLARY
Glucose-Capillary: 143 mg/dL — ABNORMAL HIGH (ref 70–99)
Glucose-Capillary: 165 mg/dL — ABNORMAL HIGH (ref 70–99)

## 2014-09-30 MED ORDER — WARFARIN SODIUM 3 MG PO TABS: 3.0000 mg | ORAL_TABLET | Freq: Every day | ORAL | Status: DC

## 2014-09-30 MED ORDER — WARFARIN SODIUM 6 MG PO TABS
6.0000 mg | ORAL_TABLET | Freq: Once | ORAL | Status: DC
  Filled 2014-09-30: qty 1

## 2014-09-30 MED ORDER — HALOPERIDOL 0.5 MG PO TABS: 0.5000 mg | ORAL_TABLET | Freq: Three times a day (TID) | ORAL | Status: DC | PRN

## 2014-09-30 NOTE — Progress Notes (Signed)
ANTICOAGULATION CONSULT NOTE - Follow-Up Consult  Pharmacy Consult for Warfarin Indication: atrial fibrillation  No Known Allergies  Patient Measurements: Height: 5\' 4"  (162.6 cm) Weight: 145 lb 11.2 oz (66.089 kg) IBW/kg (Calculated) : 54.7  Vital Signs: Temp: 98 F (36.7 C) (03/15 0616) Temp Source: Axillary (03/15 0616) BP: 168/61 mmHg (03/15 0616) Pulse Rate: 98 (03/15 0616)  Labs:  Recent Labs  09/27/14 1150 09/27/14 1635 09/28/14 0330 09/30/14 0428  HGB  --   --  13.2  --   HCT 39.9  --  41.5  --   PLT  --   --  178  --   LABPROT  --   --  33.1* 21.8*  INR  --   --  3.21* 1.88*  CREATININE  --   --  1.09  --   TROPONINI 0.11* 0.10*  --   --     Estimated Creatinine Clearance: 34.7 mL/min (by C-G formula based on Cr of 1.09).   Medical History: Past Medical History  Diagnosis Date  . Cardiomyopathy     LVEF 40-45% August 2015  . Type 2 diabetes mellitus   . Dyslipidemia   . Thyroid goiter   . Atrial fibrillation     Coumadin  . Osteoarthritis   . Essential hypertension   . Aortic stenosis     Moderate to severe August 2015      Assessment: 81yo female w/ h/o Afib, CHF, HTN, T2DM who presented to ED w/ increased confusion over the past month, then began experiencing hallucinations.  On warfarin PTA for Afib.  Pharmacy consulted to dose warfarin while admitted.  PTA warfarin dose reported as 3mg  Tuesday and Friday, 6mg  all other days INR has trended down to 1.88 today after 2 held doses.   Goal of Therapy:  INR 2-3 Monitor platelets by anticoagulation protocol: Yes   Plan:  -Coumadin 6 mg x 1 dose today. Resume home dose if planning to discharge today and f/u INR in 3-5 days  -Monitor daily INR, Q72h CBC, s/sx of bleeding   Vinnie Level, PharmD., BCPS Clinical Pharmacist Pager [phone]

## 2014-09-30 NOTE — Care Management Note (Signed)
    Page 1 of 1   09/30/2014     12:26:53 PM CARE MANAGEMENT NOTE 09/30/2014  Patient:  [name], [name]   Account Number:  [id number]  Date Initiated:  09/30/2014  Documentation initiated by:  Donn Pierini  Subjective/Objective Assessment:   Pt admitted with NSTEMI     Action/Plan:   PTA pt lived at home with daughter uses RW, per PT eval no recommendations for discharge- pt has used [facility] in past per daughter   Anticipated DC Date:  09/30/2014   Anticipated DC Plan:  HOME/SELF CARE      DC Planning Services  CM consult      Choice offered to / List presented to:             Status of service:  Completed, signed off Medicare Important Message given?  YES (If response is "NO", the following Medicare IM given date fields will be blank) Date Medicare IM given:  09/30/2014 Medicare IM given by:  Donn Pierini Date Additional Medicare IM given:   Additional Medicare IM given by:    Discharge Disposition:  HOME/SELF CARE  Per UR Regulation:  Reviewed for med. necessity/level of care/duration of stay  If discussed at Long Length of Stay Meetings, dates discussed:    Comments:

## 2014-09-30 NOTE — Progress Notes (Signed)
Medicare Important Message given? YES  (If response is "NO", the following Medicare IM given date fields will be blank)  Date Medicare IM given: 09/30/14 Medicare IM given by:  Mazal Ebey  

## 2014-09-30 NOTE — Discharge Summary (Signed)
Physician Discharge Summary  [name] ZOX:[id number] DOB: 12-Oct-1926 DOA: 09/27/2014  PCP: Clelia Schaumann, MD  Admit date: 09/27/2014 Discharge date: 09/30/2014  Time spent: greater than 30 minutes  Recommendations for Outpatient Follow-up:  1.   Discharge Diagnoses:  Active Problems:   Elevated troponin   Confusion   CHF (congestive heart failure)   Dyslipidemia   A-fib   Hypertension   DM (diabetes mellitus)   Troponin level elevated   Aortic stenosis, moderate   Discharge Condition: stable  Diet recommendation: diabetic heart healthy  Filed Weights   09/27/14 0124 09/27/14 0607  Weight: 66.225 kg (146 lb) 66.089 kg (145 lb 11.2 oz)    History of present illness:  81 y.o. female with a history of Atrial Fibrillation, CHF, HTN, DM2 who presents to the ED with complaints of increased confusion over the past month noticed by here daughters since she moved from [place] to live with her daughters. She was brought to the ED this evening because of her hallucination , that she was seeing "black snakes'" in the corner in the house. In the ED, she was found to have a positive troponin of 0.12, However she denies any chest pain.   Hospital Course:  Admitted to telemetry  Confusion- likely progressive dementia with sundowners -urine ok TSH, B12, RPR ok. folate pending CT scan of Head without Acute Findings- white matter changes. Family wishes to hold off on scheduled antipsychotics.    At discharge, pleasantly confused without agitation or hallucination  Elevated troponin trend flat. cardiology consulted and recommended echo to f/u AS.   CHF (congestive heart failure) compensated Continue Lasix, Metoprolol   Consultants:  cards  Procedures:  none  Discharge Exam: Filed Vitals:    09/30/14 0616  BP: 168/61  Pulse: 98  Temp: 98 F (36.7 C)  Resp: 18    General: alert, calm cooperative Cardiovascular: RRR Respiratory: CTA  Discharge Instructions   Discharge Instructions    Diet - low sodium heart healthy    Complete by:  As directed      Discharge instructions    Complete by:  As directed   Change coumadin to 3 mg (1 tablet)every Monday, Wednesday, Friday, then 6 mg (2 tablets) every all other days     Increase activity slowly    Complete by:  As directed           Current Discharge Medication List    START taking these medications   Details  haloperidol (HALDOL) 0.5 MG tablet Take 1 tablet (0.5 mg total) by mouth every 8 (eight) hours as needed for agitation. Qty: 10 tablet, Refills: 0      CONTINUE these medications which have CHANGED   Details  warfarin (COUMADIN) 3 MG tablet Take 1-2 tablets (3-6 mg total) by mouth daily. Take 3 mg on Monday, Wednesday, Friday, then take 6 mg all the other days      CONTINUE these medications which have NOT CHANGED   Details  calcitRIOL (ROCALTROL) 0.25 MCG capsule TAKE 1 CAPSULE (0.25 MCG TOTAL) BY MOUTH DAILY. Qty: 30 capsule, Refills: 0    diltiazem (CARDIZEM CD) 120 MG 24 hr capsule Take 1 capsule (120 mg total) by mouth 2 (two) times daily. Qty: 60 capsule, Refills: 5    furosemide (LASIX) 20 MG tablet Take 1 tablet (20 mg total) by mouth 2 (two) times daily. Qty: 180 tablet, Refills: 1    glimepiride (AMARYL) 1 MG tablet Take 0.5 mg by mouth daily  with breakfast.    lisinopril (PRINIVIL,ZESTRIL) 20 MG tablet Take 20 mg by mouth daily.    metoprolol succinate (TOPROL-XL) 50 MG 24 hr tablet Take 1 tablet (50 mg total) by mouth daily. Take with or immediately following a meal. Qty: 30 tablet, Refills: 6    potassium chloride SA (K-DUR,KLOR-CON) 20 MEQ tablet Take 10 mEq by mouth daily. 1/2 tab daily    rosuvastatin (CRESTOR) 10 MG tablet Take 10 mg by mouth daily.       No Known  Allergies Follow-up Information    Follow up with Shamleffer, Madelin Rear, MD.   Specialty:  Internal Medicine   Why:  to check INR (coumadin) and hospital follow up   Contact information:   [address]  [address] [place] [address] [phone]        The results of significant diagnostics from this hospitalization (including imaging, microbiology, ancillary and laboratory) are listed below for reference.    Significant Diagnostic Studies: Dg Chest 2 View  09/27/2014   CLINICAL DATA:  81 year old female with confusion. Altered mental status.  EXAM: CHEST  2 VIEW  COMPARISON:  08/04/2014  FINDINGS: Dual lead left-sided pacemaker remains in place. There is unchanged eventration of right hemidiaphragm. Mild cardiomegaly is unchanged allowing for differences in technique. No pulmonary edema. The previous right suprahilar opacity is no longer seen. There is no pleural effusion or pneumothorax. The bones are under mineralized, there is degenerative change in the spine.  IMPRESSION: No acute pulmonary process. The previous questioned suprahilar lung opacity is no longer seen.   Electronically Signed   By: Rubye Oaks M.D.   On: 09/27/2014 04:16   Ct Head Wo Contrast  09/27/2014   CLINICAL DATA:  Acute onset of altered mental status. Initial encounter.  EXAM: CT HEAD WITHOUT CONTRAST  TECHNIQUE: Contiguous axial images were obtained from the base of the skull through the vertex without intravenous contrast.  COMPARISON:  CT of the head performed 08/04/2014  FINDINGS: There is no evidence of acute infarction, mass lesion, or intra- or extra-axial hemorrhage on CT.  Prominence of the ventricles and sulci reflects moderate cortical volume loss. Mild cerebellar atrophy is noted. Mild periventricular and subcortical white matter change likely reflects small vessel ischemic microangiopathy. A chronic infarct is noted within the left cerebellar hemisphere. A chronic lacunar infarct is also seen at  the left basal ganglia.  The brainstem and fourth ventricle are within normal limits. The cerebral hemispheres demonstrate grossly normal gray-white differentiation. No mass effect or midline shift is seen.  There is no evidence of fracture; visualized osseous structures are unremarkable in appearance. The orbits are within normal limits. The paranasal sinuses and mastoid air cells are well-aerated. No significant soft tissue abnormalities are seen.  IMPRESSION: 1. No acute intracranial pathology seen on CT. 2. Moderate cortical volume loss and scattered small vessel ischemic microangiopathy. 3. 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 Chronic lacunar infarct at the left basal ganglia.   Electronically Signed   By: Roanna Raider M.D.   On: 09/27/2014 03:41   Echo Left ventricle: The cavity size was normal. Wall thickness was increased in a pattern of moderate LVH. Systolic function was mildly reduced. The estimated ejection fraction was in the range of 45% to 50%. - Aortic valve: There was moderate stenosis. Valve area (VTI): 0.75 cm^2. Valve area (Vmax): 0.73 cm^2. Valve area (Vmean): 0.73 cm^2. - Mitral valve: There was mild regurgitation. - Left atrium: The atrium was severely  dilated. - Right atrium: The atrium was mildly dilated. - Pulmonary arteries: Systolic pressure was moderately increased. PA peak pressure: 52 mm Hg (S). Microbiology: Recent Results (from the past 240 hour(s))  MRSA PCR Screening     Status: None   Collection Time: 09/27/14  6:00 AM  Result Value Ref Range Status   MRSA by PCR NEGATIVE NEGATIVE Final    Comment:        The GeneXpert MRSA Assay (FDA approved for NASAL specimens only), is one component of a comprehensive MRSA colonization surveillance program. It is not intended to diagnose MRSA infection nor to guide or monitor treatment for MRSA infections.   Urine culture     Status: None   Collection Time: 09/27/14 11:57 AM   Result Value Ref Range Status   Specimen Description URINE, CLEAN CATCH  Final   Special Requests NONE  Final   Colony Count   Final    50,000 COLONIES/ML Performed at [facility]    Culture   Final    Multiple bacterial morphotypes present, none predominant. Suggest appropriate recollection if clinically indicated. Performed at [facility]    Report Status 09/28/2014 FINAL  Final     Labs: Basic Metabolic Panel:  Recent Labs Lab 09/27/14 0248 09/28/14 0330  NA 140 143  K 3.9 3.6  CL 107 111  CO2 28 28  GLUCOSE 140* 131*  BUN 22 17  CREATININE 1.23* 1.09  CALCIUM 10.5 10.1   Liver Function Tests:  Recent Labs Lab 09/27/14 0248  AST 23  ALT 22  ALKPHOS 92  BILITOT 0.6  PROT 7.0  ALBUMIN 3.6   No results for input(s): LIPASE, AMYLASE in the last 168 hours. No results for input(s): AMMONIA in the last 168 hours. CBC:  Recent Labs Lab 09/27/14 0248 09/27/14 1150 09/28/14 0330  WBC 5.5  --  6.7  NEUTROABS 3.6  --   --   HGB 13.2  --  13.2  HCT 41.1 39.9 41.5  MCV 92.4  --  92.2  PLT 163  --  178   Cardiac Enzymes:  Recent Labs Lab 09/27/14 0255 09/27/14 0520 09/27/14 1150 09/27/14 1635  TROPONINI 0.12* 0.11* 0.11* 0.10*   BNP: BNP (last 3 results) No results for input(s): BNP in the last 8760 hours.  ProBNP (last 3 results) No results for input(s): PROBNP in the last 8760 hours.  CBG:  Recent Labs Lab 09/29/14 0659 09/29/14 1135 09/29/14 1705 09/29/14 2051 09/30/14 0612  GLUCAP 160* 151* 239* 183* 143*       Signed:  Lakeem Rozo L  [facility] 09/30/2014, 10:42 AM

## 2014-09-30 NOTE — Discharge Instructions (Signed)

## 2014-10-01 ENCOUNTER — Encounter: Payer: Self-pay | Admitting: Internal Medicine

## 2014-10-02 ENCOUNTER — Encounter: Payer: Self-pay | Admitting: Podiatry

## 2014-10-02 ENCOUNTER — Ambulatory Visit (INDEPENDENT_AMBULATORY_CARE_PROVIDER_SITE_OTHER): Payer: Medicare Other | Admitting: Podiatry

## 2014-10-02 DIAGNOSIS — B351 Tinea unguium: Secondary | ICD-10-CM

## 2014-10-02 DIAGNOSIS — M79673 Pain in unspecified foot: Secondary | ICD-10-CM

## 2014-10-02 NOTE — Patient Instructions (Signed)
Diabetes and Foot Care Diabetes may cause you to have problems because of poor blood supply (circulation) to your feet and legs. This may cause the skin on your feet to become thinner, break easier, and heal more slowly. Your skin may become dry, and the skin may peel and crack. You may also have nerve damage in your legs and feet causing decreased feeling in them. You may not notice minor injuries to your feet that could lead to infections or more serious problems. Taking care of your feet is one of the most important things you can do for yourself.  HOME CARE INSTRUCTIONS  Wear shoes at all times, even in the house. Do not go barefoot. Bare feet are easily injured.  Check your feet daily for blisters, cuts, and redness. If you cannot see the bottom of your feet, use a mirror or ask someone for help.  Wash your feet with warm water (do not use hot water) and mild soap. Then pat your feet and the areas between your toes until they are completely dry. Do not soak your feet as this can dry your skin.  Apply a moisturizing lotion or petroleum jelly (that does not contain alcohol and is unscented) to the skin on your feet and to dry, brittle toenails. Do not apply lotion between your toes.  Trim your toenails straight across. Do not dig under them or around the cuticle. File the edges of your nails with an emery board or nail file.  Do not cut corns or calluses or try to remove them with medicine.  Wear clean socks or stockings every day. Make sure they are not too tight. Do not wear knee-high stockings since they may decrease blood flow to your legs.  Wear shoes that fit properly and have enough cushioning. To break in new shoes, wear them for just a few hours a day. This prevents you from injuring your feet. Always look in your shoes before you put them on to be sure there are no objects inside.  Do not cross your legs. This may decrease the blood flow to your feet.  If you find a minor scrape,  cut, or break in the skin on your feet, keep it and the skin around it clean and dry. These areas may be cleansed with mild soap and water. Do not cleanse the area with peroxide, alcohol, or iodine.  When you remove an adhesive bandage, be sure not to damage the skin around it.  If you have a wound, look at it several times a day to make sure it is healing.  Do not use heating pads or hot water bottles. They may burn your skin. If you have lost feeling in your feet or legs, you may not know it is happening until it is too late.  Make sure your health care provider performs a complete foot exam at least annually or more often if you have foot problems. Report any cuts, sores, or bruises to your health care provider immediately. SEEK MEDICAL CARE IF:   You have an injury that is not healing.  You have cuts or breaks in the skin.  You have an ingrown nail.  You notice redness on your legs or feet.  You feel burning or tingling in your legs or feet.  You have pain or cramps in your legs and feet.  Your legs or feet are numb.  Your feet always feel cold. SEEK IMMEDIATE MEDICAL CARE IF:   There is increasing redness,   swelling, or pain in or around a wound.  There is a red line that goes up your leg.  Pus is coming from a wound.  You develop a fever or as directed by your health care provider.  You notice a bad smell coming from an ulcer or wound. Document Released: 07/01/2000 Document Revised: 03/06/2013 Document Reviewed: 12/11/2012 ExitCare Patient Information 2015 [employer]. This information is not intended to replace advice given to you by your health care provider. Make sure you discuss any questions you have with your health care provider.  

## 2014-10-02 NOTE — Progress Notes (Signed)
Presents today chief complaint of painful elongated toenails.  Objective: Pulses are palpable bilateral nails are thick, yellow dystrophic onychomycosis and painful palpation.   Assessment: Onychomycosis with pain in limb.  Plan: Treatment of nails in thickness and length as covered service secondary to pain.  

## 2014-10-09 DIAGNOSIS — I4891 Unspecified atrial fibrillation: Secondary | ICD-10-CM | POA: Diagnosis not present

## 2014-10-12 ENCOUNTER — Other Ambulatory Visit: Payer: Self-pay | Admitting: Internal Medicine

## 2014-10-12 ENCOUNTER — Encounter: Payer: Self-pay | Admitting: Internal Medicine

## 2014-10-14 ENCOUNTER — Other Ambulatory Visit: Payer: Self-pay

## 2014-10-14 MED ORDER — METOPROLOL SUCCINATE ER 50 MG PO TB24: 50.0000 mg | ORAL_TABLET | Freq: Every day | ORAL | Status: DC

## 2014-10-18 ENCOUNTER — Other Ambulatory Visit: Payer: Self-pay | Admitting: Internal Medicine

## 2014-10-21 NOTE — Telephone Encounter (Signed)
Ok with 6 refills 

## 2014-10-21 NOTE — Telephone Encounter (Signed)
Message from Yolonda Kida sent at 10/20/2014 10:24 AM EDT -----    May we refill

## 2014-11-06 DIAGNOSIS — I4891 Unspecified atrial fibrillation: Secondary | ICD-10-CM | POA: Diagnosis not present

## 2014-11-27 ENCOUNTER — Encounter: Payer: Self-pay | Admitting: Internal Medicine

## 2014-12-02 DIAGNOSIS — E785 Hyperlipidemia, unspecified: Secondary | ICD-10-CM | POA: Diagnosis not present

## 2014-12-02 DIAGNOSIS — N183 Chronic kidney disease, stage 3 (moderate): Secondary | ICD-10-CM | POA: Diagnosis not present

## 2014-12-02 DIAGNOSIS — E1122 Type 2 diabetes mellitus with diabetic chronic kidney disease: Secondary | ICD-10-CM | POA: Diagnosis not present

## 2014-12-02 DIAGNOSIS — I2510 Atherosclerotic heart disease of native coronary artery without angina pectoris: Secondary | ICD-10-CM | POA: Diagnosis not present

## 2014-12-02 DIAGNOSIS — I10 Essential (primary) hypertension: Secondary | ICD-10-CM | POA: Diagnosis not present

## 2014-12-02 DIAGNOSIS — I480 Paroxysmal atrial fibrillation: Secondary | ICD-10-CM | POA: Diagnosis not present

## 2014-12-02 DIAGNOSIS — I4891 Unspecified atrial fibrillation: Secondary | ICD-10-CM | POA: Diagnosis not present

## 2014-12-02 DIAGNOSIS — I5022 Chronic systolic (congestive) heart failure: Secondary | ICD-10-CM | POA: Diagnosis not present

## 2014-12-23 ENCOUNTER — Ambulatory Visit (INDEPENDENT_AMBULATORY_CARE_PROVIDER_SITE_OTHER): Payer: Medicare Other | Admitting: *Deleted

## 2014-12-23 DIAGNOSIS — I495 Sick sinus syndrome: Secondary | ICD-10-CM

## 2014-12-28 ENCOUNTER — Encounter: Payer: Self-pay | Admitting: Internal Medicine

## 2014-12-28 DIAGNOSIS — I495 Sick sinus syndrome: Secondary | ICD-10-CM | POA: Diagnosis not present

## 2014-12-29 NOTE — Progress Notes (Signed)
Remote pacemaker transmission.   

## 2015-01-01 ENCOUNTER — Encounter: Payer: Self-pay | Admitting: Podiatry

## 2015-01-01 ENCOUNTER — Ambulatory Visit (INDEPENDENT_AMBULATORY_CARE_PROVIDER_SITE_OTHER): Payer: Medicare Other | Admitting: Podiatry

## 2015-01-01 VITALS — BP 131/65 | HR 54 | Resp 18

## 2015-01-01 DIAGNOSIS — M79673 Pain in unspecified foot: Secondary | ICD-10-CM | POA: Diagnosis not present

## 2015-01-01 DIAGNOSIS — E1142 Type 2 diabetes mellitus with diabetic polyneuropathy: Secondary | ICD-10-CM

## 2015-01-01 DIAGNOSIS — B351 Tinea unguium: Secondary | ICD-10-CM

## 2015-01-01 DIAGNOSIS — I4891 Unspecified atrial fibrillation: Secondary | ICD-10-CM | POA: Diagnosis not present

## 2015-01-01 LAB — CUP PACEART REMOTE DEVICE CHECK
Battery Remaining Percentage: 95 %
Battery Voltage: 3.02 V
Lead Channel Impedance Value: 430 Ohm
Lead Channel Pacing Threshold Amplitude: 0.75 V
Lead Channel Pacing Threshold Pulse Width: 0.5 ms
Lead Channel Pacing Threshold Pulse Width: 0.8 ms
Lead Channel Sensing Intrinsic Amplitude: 12 mV
Lead Channel Setting Pacing Amplitude: 2.25 V
Lead Channel Setting Pacing Pulse Width: 0.5 ms
Lead Channel Setting Sensing Sensitivity: 2 mV
MDC IDC MSMT BATTERY REMAINING LONGEVITY: 92 mo
MDC IDC MSMT LEADCHNL RV IMPEDANCE VALUE: 540 Ohm
MDC IDC MSMT LEADCHNL RV PACING THRESHOLD AMPLITUDE: 0.75 V
MDC IDC SESS DTM: 20160612072049
MDC IDC SET LEADCHNL LV PACING PULSEWIDTH: 0.8 ms
MDC IDC SET LEADCHNL RV PACING AMPLITUDE: 2.5 V
Pulse Gen Model: 3242
Pulse Gen Serial Number: 7594455

## 2015-01-01 NOTE — Progress Notes (Signed)
Patient ID: [name], female   DOB: 08-30-1926, 81 y.o.   MRN: [id number] HPI  Complaint:  Visit Type: Patient returns to my office for continued preventative foot care services. Complaint: Patient states" my nails have grown long and thick and become painful to walk and wear shoes" Patient has been diagnosed with DM with angiopathy. He presents for preventative foot care services. No changes to ROS  Podiatric Exam: Vascular: dorsalis pedis and posterior tibial pulses are negative. Capillary return is diminished. Temperature gradient is negative. Skin turgor WNL,  Sensorium: Normal Semmes Weinstein monofilament test. Normal tactile sensation bilaterally.  Nail Exam: Pt has thick disfigured discolored nails with subungual debris noted bilateral entire nail hallux through fifth toenails Ulcer Exam: There is no evidence of ulcer or pre-ulcerative changes or infection. Orthopedic Exam: Muscle tone and strength are WNL. No limitations in general ROM. No crepitus or effusions noted. Foot type and digits show no abnormalities. Bony prominences are unremarkable. Skin: No Porokeratosis. No infection or ulcers  Diagnosis:  Tinea unguium, Pain in right toe, pain in left toes  Treatment & Plan Procedures and Treatment: Consent by patient was obtained for treatment procedures. The patient understood the discussion of treatment and procedures well. All questions were answered thoroughly reviewed. Debridement of mycotic and hypertrophic toenails, 1 through 5 bilateral and clearing of subungual debris. No ulceration, no infection noted.  Return Visit-Office Procedure: Patient instructed to return to the office for a follow up visit 3 months for continued evaluation and treatment.

## 2015-01-05 DIAGNOSIS — I4891 Unspecified atrial fibrillation: Secondary | ICD-10-CM | POA: Diagnosis not present

## 2015-01-14 ENCOUNTER — Encounter: Payer: Self-pay | Admitting: Cardiology

## 2015-01-15 DIAGNOSIS — I4891 Unspecified atrial fibrillation: Secondary | ICD-10-CM | POA: Diagnosis not present

## 2015-01-16 ENCOUNTER — Other Ambulatory Visit: Payer: Self-pay | Admitting: Internal Medicine

## 2015-01-30 DIAGNOSIS — Z5181 Encounter for therapeutic drug level monitoring: Secondary | ICD-10-CM | POA: Diagnosis not present

## 2015-01-30 DIAGNOSIS — Z7901 Long term (current) use of anticoagulants: Secondary | ICD-10-CM | POA: Diagnosis not present

## 2015-02-05 ENCOUNTER — Encounter: Payer: Self-pay | Admitting: Internal Medicine

## 2015-02-13 DIAGNOSIS — I480 Paroxysmal atrial fibrillation: Secondary | ICD-10-CM | POA: Diagnosis not present

## 2015-02-27 DIAGNOSIS — Z7901 Long term (current) use of anticoagulants: Secondary | ICD-10-CM | POA: Diagnosis not present

## 2015-03-26 DIAGNOSIS — I82409 Acute embolism and thrombosis of unspecified deep veins of unspecified lower extremity: Secondary | ICD-10-CM | POA: Diagnosis not present

## 2015-04-09 ENCOUNTER — Ambulatory Visit (INDEPENDENT_AMBULATORY_CARE_PROVIDER_SITE_OTHER): Payer: Medicare Other | Admitting: Podiatry

## 2015-04-09 ENCOUNTER — Encounter: Payer: Self-pay | Admitting: Podiatry

## 2015-04-09 VITALS — BP 99/69 | HR 75 | Resp 16

## 2015-04-09 DIAGNOSIS — M79673 Pain in unspecified foot: Secondary | ICD-10-CM

## 2015-04-09 DIAGNOSIS — E1142 Type 2 diabetes mellitus with diabetic polyneuropathy: Secondary | ICD-10-CM

## 2015-04-09 DIAGNOSIS — B351 Tinea unguium: Secondary | ICD-10-CM

## 2015-04-09 NOTE — Progress Notes (Signed)
HPI Presents today chief complaint of painful elongated toenails.  Objective: Pulses are palpable bilateral nails are thick, yellow dystrophic onychomycosis and painful palpation.   Assessment: Onychomycosis with pain in limb.  Plan: Treatment of nails in thickness and length as covered service secondary to pain.  

## 2015-04-09 NOTE — Progress Notes (Signed)
Subjective:     Patient ID: [name], female   DOB: [birth year], 81 y.o.   MRN: [id number]  HPI patient presents with elongated nailbeds 1-5 both feet that can become painful and also noted to have diabetes which is under reasonable control but does create some problems   Review of Systems     Objective:   Physical Exam Neurovascular status mildly diminished but unchanged with thick yellow brittle nailbeds 1-5 both feet that are painful dorsally and make wearing shoe gear difficult and are impossible for patient or caregiver to cut    Assessment:     At risk diabetic with mycotic nail infection with pain 1-5 both feet    Plan:     Debride painful nailbeds 1-5 both feet with no iatrogenic bleeding noted

## 2015-04-21 ENCOUNTER — Encounter: Payer: Self-pay | Admitting: *Deleted

## 2015-04-23 ENCOUNTER — Ambulatory Visit (INDEPENDENT_AMBULATORY_CARE_PROVIDER_SITE_OTHER): Payer: Medicare Other | Admitting: *Deleted

## 2015-04-23 ENCOUNTER — Encounter: Payer: Self-pay | Admitting: Internal Medicine

## 2015-04-23 DIAGNOSIS — I4891 Unspecified atrial fibrillation: Secondary | ICD-10-CM | POA: Diagnosis not present

## 2015-04-23 DIAGNOSIS — Z7901 Long term (current) use of anticoagulants: Secondary | ICD-10-CM | POA: Diagnosis not present

## 2015-04-23 DIAGNOSIS — I5032 Chronic diastolic (congestive) heart failure: Secondary | ICD-10-CM

## 2015-04-23 LAB — CUP PACEART INCLINIC DEVICE CHECK
Battery Remaining Longevity: 109.2 mo
Brady Statistic RV Percent Paced: 44 %
Date Time Interrogation Session: 20161006151948
Lead Channel Impedance Value: 437.5 Ohm
Lead Channel Pacing Threshold Amplitude: 0.75 V
Lead Channel Pacing Threshold Pulse Width: 0.8 ms
Lead Channel Sensing Intrinsic Amplitude: 12 mV
Lead Channel Setting Pacing Pulse Width: 0.5 ms
MDC IDC MSMT BATTERY VOLTAGE: 3.02 V
MDC IDC MSMT LEADCHNL RV IMPEDANCE VALUE: 537.5 Ohm
MDC IDC MSMT LEADCHNL RV PACING THRESHOLD AMPLITUDE: 0.5 V
MDC IDC MSMT LEADCHNL RV PACING THRESHOLD PULSEWIDTH: 0.5 ms
MDC IDC SET LEADCHNL LV PACING AMPLITUDE: 2.25 V
MDC IDC SET LEADCHNL LV PACING PULSEWIDTH: 0.8 ms
MDC IDC SET LEADCHNL RV PACING AMPLITUDE: 2.5 V
MDC IDC SET LEADCHNL RV SENSING SENSITIVITY: 2 mV
MDC IDC STAT BRADY RA PERCENT PACED: 0 %
Pulse Gen Model: 3242
Pulse Gen Serial Number: 7594455

## 2015-04-23 NOTE — Progress Notes (Signed)
CRT-P device check in clinic. Normal device function. Thresholds, sensing, impedance consistent with previous measurements. Histograms appropriate for patient and level of activity. 73 ventricular high rate episodes- longest 1 hr (@ 190bpm), pk V 204 (lasting 2 seconds), pt reports that she can tell when her HR is up due to increased SOB but denies CP and dizziness. Patient bi-ventricularly pacing 44% of the time- AF +warfarin and diltiazem. Device programmed with appropriate safety margins. Device heart failure diagnostics are within normal limits and stable over time. Estimated longevity 7.9-9.1 years. Patient enrolled in remote follow-up. ROV with SK 07/27/15.

## 2015-05-09 DIAGNOSIS — Z23 Encounter for immunization: Secondary | ICD-10-CM | POA: Diagnosis not present

## 2015-05-11 DIAGNOSIS — Z7901 Long term (current) use of anticoagulants: Secondary | ICD-10-CM | POA: Diagnosis not present

## 2015-05-25 ENCOUNTER — Other Ambulatory Visit: Payer: Self-pay | Admitting: Internal Medicine

## 2015-05-26 ENCOUNTER — Other Ambulatory Visit: Payer: Self-pay | Admitting: *Deleted

## 2015-05-26 NOTE — Telephone Encounter (Signed)
Opened in error

## 2015-05-26 NOTE — Telephone Encounter (Signed)
On 09/23/14 Diltiazem was increased to 120 mg BID. I don't see any instructions to change since that time. Can you please call the patient and see how she is taking this? If she is only taking this once daily, ok to refill for once daily until her follow up with Dr. Graciela Husbands. She is coming to see him 07/27/15 and we can readdress at that time.  Thanks!

## 2015-05-26 NOTE — Telephone Encounter (Signed)
Should the patient still be taking this bid? At her 09/23/14 office visit, she was instructed to increase dose to bid. It was refilled on 10/20/14 for qd. I do not see where her dose was reduced back to qd. Please advise. Thanks, MI

## 2015-05-27 NOTE — Telephone Encounter (Signed)
Spoke with daughter and she stated that the patient has been taking diltiazem 120mg  qd as this is what is on the bottle. I was informing her that I would send in an rx for 120mg  qd, and then when she comes in for her January appt this can be readdressed. When I was almost finished with this sentence the call was somehow disconnected. Was unable to get her back on the phone.

## 2015-05-27 NOTE — Telephone Encounter (Signed)
Tried to reach patient twice today at number listed. Phone rings out and then beeps.

## 2015-05-28 ENCOUNTER — Telehealth: Payer: Self-pay | Admitting: Internal Medicine

## 2015-05-28 NOTE — Telephone Encounter (Signed)
Returned call, confirmed Metoprolol XL dosage as 50mg  once a day and Cardizem CD as 120mg  once a day.  They were confused due to the pharmacy refilling and older script for Metoprolol.

## 2015-05-28 NOTE — Telephone Encounter (Signed)
New message     Pt c/o medication issue:  1. Name of Medication: metoprolol   2. How are you currently taking this medication (dosage and times per day)? 50mg  daily  3. Are you having a reaction (difficulty breathing--STAT)? no 4. What is your medication issue? Pt recently picked up her refill and the pharmacist gave her a presc for 50mg  and a presc for 100mg .  Calling to verify dosage.  Pt c/o medication issue:  1. Name of Medication: diltiazem 2. How are you currently taking this medication (dosage and times per day)? 120mg  3. Are you having a reaction (difficulty breathing--STAT)? no  4. What is your medication issue? Calling to verify dosage on medication

## 2015-06-01 ENCOUNTER — Other Ambulatory Visit: Payer: Self-pay | Admitting: Internal Medicine

## 2015-06-01 NOTE — Telephone Encounter (Signed)
We don't prescribe this medication- ever that I recall. Will need to get through her PCP.

## 2015-06-02 DIAGNOSIS — H6120 Impacted cerumen, unspecified ear: Secondary | ICD-10-CM | POA: Diagnosis not present

## 2015-06-02 DIAGNOSIS — Z1389 Encounter for screening for other disorder: Secondary | ICD-10-CM | POA: Diagnosis not present

## 2015-06-02 DIAGNOSIS — I482 Chronic atrial fibrillation: Secondary | ICD-10-CM | POA: Diagnosis not present

## 2015-06-02 DIAGNOSIS — E1140 Type 2 diabetes mellitus with diabetic neuropathy, unspecified: Secondary | ICD-10-CM | POA: Diagnosis not present

## 2015-06-02 DIAGNOSIS — I10 Essential (primary) hypertension: Secondary | ICD-10-CM | POA: Diagnosis not present

## 2015-06-02 DIAGNOSIS — Z0000 Encounter for general adult medical examination without abnormal findings: Secondary | ICD-10-CM | POA: Diagnosis not present

## 2015-06-02 DIAGNOSIS — I509 Heart failure, unspecified: Secondary | ICD-10-CM | POA: Diagnosis not present

## 2015-06-06 ENCOUNTER — Other Ambulatory Visit: Payer: Self-pay | Admitting: Internal Medicine

## 2015-06-30 DIAGNOSIS — Z7901 Long term (current) use of anticoagulants: Secondary | ICD-10-CM | POA: Diagnosis not present

## 2015-07-03 ENCOUNTER — Other Ambulatory Visit: Payer: Self-pay | Admitting: Internal Medicine

## 2015-07-03 ENCOUNTER — Encounter: Payer: Self-pay | Admitting: Podiatry

## 2015-07-03 ENCOUNTER — Ambulatory Visit (INDEPENDENT_AMBULATORY_CARE_PROVIDER_SITE_OTHER): Payer: Medicare Other | Admitting: Podiatry

## 2015-07-03 DIAGNOSIS — M79673 Pain in unspecified foot: Secondary | ICD-10-CM

## 2015-07-03 DIAGNOSIS — B351 Tinea unguium: Secondary | ICD-10-CM

## 2015-07-03 DIAGNOSIS — L84 Corns and callosities: Secondary | ICD-10-CM | POA: Diagnosis not present

## 2015-07-03 NOTE — Progress Notes (Signed)
Subjective:     Patient ID: [name], female   DOB: Jul 26, 1926, 81 y.o.   MRN: [id number]  HPI patient presents with painful lesion on the left fifth toe that they've tried wider shoes and nail disease with thickness 1-5 of both feet that she cannot cut and they become incurvated in the corners   Review of Systems     Objective:   Physical Exam Neurovascular status unchanged with thick yellow brittle nailbeds 1-5 both feet that are painful and a painful keratotic lesion fifth digit left foot that patient has tried to wear wider shoe and TRAM    Assessment:     Mycotic nail infection with pain 1-5 both feet with keratotic lesion of the fifth toe left it's painful    Plan:     Discussed both conditions with caregiver and debrided nailbeds today 1-5 both feet and lesion on the fifth digit left which was tolerated well and reappoint to recheck

## 2015-07-09 ENCOUNTER — Ambulatory Visit: Payer: Medicare Other | Admitting: Podiatry

## 2015-07-23 DIAGNOSIS — Z7901 Long term (current) use of anticoagulants: Secondary | ICD-10-CM | POA: Diagnosis not present

## 2015-07-27 ENCOUNTER — Encounter: Payer: Medicare Other | Admitting: Internal Medicine

## 2015-07-30 ENCOUNTER — Ambulatory Visit (INDEPENDENT_AMBULATORY_CARE_PROVIDER_SITE_OTHER): Payer: Medicare Other | Admitting: Internal Medicine

## 2015-07-30 ENCOUNTER — Encounter: Payer: Self-pay | Admitting: Internal Medicine

## 2015-07-30 VITALS — BP 124/72 | HR 88 | Ht 64.0 in | Wt 163.0 lb

## 2015-07-30 DIAGNOSIS — R2681 Unsteadiness on feet: Secondary | ICD-10-CM | POA: Diagnosis not present

## 2015-07-30 DIAGNOSIS — I10 Essential (primary) hypertension: Secondary | ICD-10-CM

## 2015-07-30 DIAGNOSIS — I5030 Unspecified diastolic (congestive) heart failure: Secondary | ICD-10-CM

## 2015-07-30 DIAGNOSIS — Z950 Presence of cardiac pacemaker: Secondary | ICD-10-CM | POA: Diagnosis not present

## 2015-07-30 DIAGNOSIS — I482 Chronic atrial fibrillation, unspecified: Secondary | ICD-10-CM

## 2015-07-30 LAB — CUP PACEART INCLINIC DEVICE CHECK
Battery Voltage: 3.02 V
Brady Statistic RA Percent Paced: 0 %
Brady Statistic RV Percent Paced: 19 %
Date Time Interrogation Session: 20170112180411
Implantable Lead Implant Date: 20150721
Implantable Lead Location: 753860
Lead Channel Impedance Value: 525 Ohm
Lead Channel Pacing Threshold Amplitude: 0.75 V
Lead Channel Sensing Intrinsic Amplitude: 12 mV
Lead Channel Setting Pacing Amplitude: 2.5 V
MDC IDC LEAD IMPLANT DT: 20150721
MDC IDC LEAD LOCATION: 753858
MDC IDC MSMT BATTERY REMAINING LONGEVITY: 133.2
MDC IDC MSMT LEADCHNL LV IMPEDANCE VALUE: 425 Ohm
MDC IDC MSMT LEADCHNL LV PACING THRESHOLD AMPLITUDE: 0.75 V
MDC IDC MSMT LEADCHNL LV PACING THRESHOLD PULSEWIDTH: 0.8 ms
MDC IDC MSMT LEADCHNL RV PACING THRESHOLD PULSEWIDTH: 0.5 ms
MDC IDC SET LEADCHNL RV PACING PULSEWIDTH: 0.5 ms
MDC IDC SET LEADCHNL RV SENSING SENSITIVITY: 2 mV
Pulse Gen Model: 3242
Pulse Gen Serial Number: 7594455

## 2015-07-30 NOTE — Progress Notes (Signed)
Patient Care Team: Tommy Rainwater, MD as PCP - General (Internal Medicine)   HPI  [name] is a 82 y.o. female Seen in followup following a recent pacemaker implanted in [place] at which time she received a CRT P. She has atrial fibrillation which is permanent associated with a rapid response. At her hospitalization in [place] she was noted to have an opacified right lung; repeat chest x-ray here demonstrated aeration.  We increased her beta blocker to see if we can improve heart rate control with the idea being that AV junction ablation might be necessary; this was assoc with fatigue so dose was reduced and CCB added   Ejection fraction of her to 40-45%   She and her daughter both noticed some slowing down. There is also concern about her balance. No significant chest pain or shortness of breath.  Coumadin and diabetes are followed at [facility]. We do not have those data.  Past Medical History  Diagnosis Date  . Cardiomyopathy (HCC)     LVEF 40-45% August 2015  . Type 2 diabetes mellitus (HCC)   . Dyslipidemia   . Thyroid goiter   . Atrial fibrillation (HCC)     Coumadin  . Osteoarthritis   . Essential hypertension   . Aortic stenosis     Moderate to severe August 2015    [name]  Past Surgical History  Procedure Laterality Date  . Pacemaker insertion      St Jude CRT-P ([place])  . Cardiac catheterization    . Abdominal hysterectomy      Current Outpatient Prescriptions  Medication Sig Dispense Refill  . calcitRIOL (ROCALTROL) 0.25 MCG capsule TAKE 1 CAPSULE (0.25 MCG TOTAL) BY MOUTH DAILY. 30 capsule 6  . diltiazem (CARDIZEM CD) 120 MG 24 hr capsule TAKE 1 CAPSULE BY MOUTH DAILY 30 capsule 6  . diltiazem (CARDIZEM CD) 120 MG 24 hr capsule TAKE 1 CAPSULE BY MOUTH DAILY 30 capsule 1  . furosemide (LASIX) 20 MG tablet TAKE 1 TABLET BY MOUTH 2 TIMES DAILY 180 tablet 0  . glimepiride (AMARYL) 1 MG tablet Take 0.5 mg by mouth daily with  breakfast.    . lisinopril (PRINIVIL,ZESTRIL) 20 MG tablet Take 20 mg by mouth daily.    . metoprolol succinate (TOPROL-XL) 50 MG 24 hr tablet Take 1 tablet (50 mg total) by mouth daily. Take with or immediately following a meal. 30 tablet 6  . potassium chloride SA (K-DUR,KLOR-CON) 20 MEQ tablet Take 10 mEq by mouth daily. 1/2 tab daily    . rosuvastatin (CRESTOR) 10 MG tablet Take 10 mg by mouth daily.    Marland Kitchen warfarin (COUMADIN) 3 MG tablet Take 1-2 tablets (3-6 mg total) by mouth daily. Take 3 mg on Monday, Wednesday, Friday, then take 6 mg all the other days     No current facility-administered medications for this visit.    No Known Allergies  Review of Systems negative except from HPI and PMH  Physical Exam BP 124/72 mmHg  Pulse 88  Ht 5\' 4"  (1.626 m)  Wt 163 lb (73.936 kg)  BMI 27.97 kg/m2 Well developed and well nourished in no acute distress HENT normal E scleral and icterus clear Neck Supple JVP 7  Clear to ausculation  Regular rate and rhythm, no murmurs gallops or rub Soft with active bowel sounds No clubbing cyanosis  Tr Edema Alert and oriented, grossly normal motor and sensory function  Walks with walker Skin Warm and Dry  Atrial fibrillation 88 Intervals 22/10/37 Nonspecific ST-T changes  Assessment and  Plan  HFpEF  Hypertension  Atrial fibrillation-permanent  Pacemaker-CRT-St. Jude   blood pressure is well-controlled  Rate control was adequately accomplished. LV pacing was turned off.  I am concerned about gait stability and balance. We discussed the importance of using her walker and working on leg strength.  We also discussed the importance of diet and her daughter will look into dietary supplements that are compatible with her diabetes.

## 2015-07-30 NOTE — Patient Instructions (Signed)
Medication Instructions: - no changes  Labwork: - none  Procedures/Testing: - none  Follow-Up: - Remote monitoring is used to monitor your Pacemaker of ICD from home. This monitoring reduces the number of office visits required to check your device to one time per year. It allows [place] to keep an eye on the functioning of your device to ensure it is working properly. You are scheduled for a device check from home on 10/29/15. You may send your transmission at any time that day. If you have a wireless device, the transmission will be sent automatically. After your physician reviews your transmission, you will receive a postcard with your next transmission date.  - Your physician wants you to follow-up in: 6 months with Francis Dowse, PA for Dr. Graciela Husbands & 1 year with Dr. Graciela Husbands. You will receive a reminder letter in the mail two months in advance. If you don't receive a letter, please call our office to schedule the follow-up appointment.  Any Additional Special Instructions Will Be Listed Below (If Applicable).

## 2015-08-25 DIAGNOSIS — Z7901 Long term (current) use of anticoagulants: Secondary | ICD-10-CM | POA: Diagnosis not present

## 2015-09-22 DIAGNOSIS — Z7901 Long term (current) use of anticoagulants: Secondary | ICD-10-CM | POA: Diagnosis not present

## 2015-10-02 ENCOUNTER — Encounter: Payer: Self-pay | Admitting: Podiatry

## 2015-10-02 ENCOUNTER — Ambulatory Visit (INDEPENDENT_AMBULATORY_CARE_PROVIDER_SITE_OTHER): Payer: Medicare Other | Admitting: Podiatry

## 2015-10-02 DIAGNOSIS — L84 Corns and callosities: Secondary | ICD-10-CM | POA: Diagnosis not present

## 2015-10-02 DIAGNOSIS — M79673 Pain in unspecified foot: Secondary | ICD-10-CM

## 2015-10-02 DIAGNOSIS — B351 Tinea unguium: Secondary | ICD-10-CM

## 2015-10-02 NOTE — Progress Notes (Signed)
Patient ID: LOWYN VESCOVI, female   DOB: 07-Feb-1927, 82 y.o.   MRN: [id number]  Subjective: 82 y.o. returns the office today for painful, elongated, thickened toenails which she cannot trim herself. Denies any redness or drainage around the nails. Denies any acute changes since last appointment and no new complaints today. Denies any systemic complaints such as fevers, chills, nausea, vomiting.   Objective: AAO 3, NAD DP/PT pulses palpable, CRT less than 3 seconds  Nails hypertrophic, dystrophic, elongated, brittle, discolored 10. There is tenderness overlying the nails 1-5 bilaterally. There is no surrounding erythema or drainage along the nail sites.  No open lesions or pre-ulcerative lesions are identified. No other areas of tenderness bilateral lower extremities. No overlying edema, erythema, increased warmth. No pain with calf compression, swelling, warmth, erythema. Small hyperkerotic lesion right lateral 5th toe. Upon debridement, no underlying ulceration drainage or other signs of infection. She has an annular uniform in color flat mole on the anterior lateral aspect of the left leg. There is no pain or drainage or any swelling. This appears to mole there is no suspicious nature to the area at this time. No other open lesions present lesions. No pain with calf compression, swelling, warmth, erythema.  Assessment: Patient presents with symptomatic onychomycosis; hyperkerotic lesion  Plan: -Treatment options including alternatives, risks, complications were discussed -Nails sharply debrided 10 without complication/bleeding. -Hyperkeratotic lesion debridement 1 without complications or bleeding. Offloading pads dispensed. -Discussed daily foot inspection. If there are any changes, to call the office immediately.  -Follow-up in 3 months or sooner if any problems are to arise. In the meantime, encouraged to call the office with any questions, concerns, changes symptoms.  Ovid Curd, DPM

## 2015-10-15 ENCOUNTER — Other Ambulatory Visit: Payer: Self-pay | Admitting: Internal Medicine

## 2015-10-17 ENCOUNTER — Other Ambulatory Visit: Payer: Self-pay | Admitting: Internal Medicine

## 2015-10-20 DIAGNOSIS — Z7901 Long term (current) use of anticoagulants: Secondary | ICD-10-CM | POA: Diagnosis not present

## 2015-10-29 ENCOUNTER — Ambulatory Visit (INDEPENDENT_AMBULATORY_CARE_PROVIDER_SITE_OTHER): Payer: Medicare Other | Admitting: *Deleted

## 2015-10-29 DIAGNOSIS — I495 Sick sinus syndrome: Secondary | ICD-10-CM | POA: Diagnosis not present

## 2015-11-02 NOTE — Progress Notes (Signed)
Remote pacemaker transmission.   

## 2015-11-11 ENCOUNTER — Ambulatory Visit
Admission: RE | Admit: 2015-11-11 | Discharge: 2015-11-11 | Disposition: A | Discharge status: Home / Self Care | Payer: Medicare Other | Source: Ambulatory Visit | Attending: Nurse Practitioner | Admitting: Nurse Practitioner

## 2015-11-11 ENCOUNTER — Other Ambulatory Visit: Payer: Self-pay | Admitting: Nurse Practitioner

## 2015-11-11 ENCOUNTER — Inpatient Hospital Stay (HOSPITAL_COMMUNITY)
Admission: EM | Admit: 2015-11-11 | Discharge: 2015-11-15 | DRG: 291 | Disposition: A | Discharge status: Home Health | Payer: Medicare Other | Attending: Internal Medicine | Admitting: Internal Medicine

## 2015-11-11 ENCOUNTER — Encounter (HOSPITAL_COMMUNITY): Payer: Self-pay | Admitting: Emergency Medicine

## 2015-11-11 DIAGNOSIS — M1990 Unspecified osteoarthritis, unspecified site: Secondary | ICD-10-CM | POA: Diagnosis present

## 2015-11-11 DIAGNOSIS — I482 Chronic atrial fibrillation, unspecified: Secondary | ICD-10-CM | POA: Diagnosis present

## 2015-11-11 DIAGNOSIS — R0602 Shortness of breath: Secondary | ICD-10-CM | POA: Diagnosis not present

## 2015-11-11 DIAGNOSIS — I10 Essential (primary) hypertension: Secondary | ICD-10-CM | POA: Diagnosis present

## 2015-11-11 DIAGNOSIS — I110 Hypertensive heart disease with heart failure: Secondary | ICD-10-CM | POA: Diagnosis not present

## 2015-11-11 DIAGNOSIS — I429 Cardiomyopathy, unspecified: Secondary | ICD-10-CM | POA: Diagnosis present

## 2015-11-11 DIAGNOSIS — I350 Nonrheumatic aortic (valve) stenosis: Secondary | ICD-10-CM

## 2015-11-11 DIAGNOSIS — I509 Heart failure, unspecified: Secondary | ICD-10-CM | POA: Insufficient documentation

## 2015-11-11 DIAGNOSIS — N183 Chronic kidney disease, stage 3 (moderate): Secondary | ICD-10-CM | POA: Diagnosis present

## 2015-11-11 DIAGNOSIS — I130 Hypertensive heart and chronic kidney disease with heart failure and stage 1 through stage 4 chronic kidney disease, or unspecified chronic kidney disease: Secondary | ICD-10-CM | POA: Diagnosis not present

## 2015-11-11 DIAGNOSIS — I5031 Acute diastolic (congestive) heart failure: Secondary | ICD-10-CM

## 2015-11-11 DIAGNOSIS — Z7901 Long term (current) use of anticoagulants: Secondary | ICD-10-CM

## 2015-11-11 DIAGNOSIS — E119 Type 2 diabetes mellitus without complications: Secondary | ICD-10-CM

## 2015-11-11 DIAGNOSIS — Z950 Presence of cardiac pacemaker: Secondary | ICD-10-CM

## 2015-11-11 DIAGNOSIS — E785 Hyperlipidemia, unspecified: Secondary | ICD-10-CM | POA: Diagnosis present

## 2015-11-11 DIAGNOSIS — R778 Other specified abnormalities of plasma proteins: Secondary | ICD-10-CM | POA: Diagnosis present

## 2015-11-11 DIAGNOSIS — R7989 Other specified abnormal findings of blood chemistry: Secondary | ICD-10-CM | POA: Diagnosis present

## 2015-11-11 DIAGNOSIS — E1122 Type 2 diabetes mellitus with diabetic chronic kidney disease: Secondary | ICD-10-CM | POA: Diagnosis present

## 2015-11-11 DIAGNOSIS — I4891 Unspecified atrial fibrillation: Secondary | ICD-10-CM | POA: Diagnosis present

## 2015-11-11 DIAGNOSIS — I5023 Acute on chronic systolic (congestive) heart failure: Secondary | ICD-10-CM | POA: Diagnosis present

## 2015-11-11 HISTORY — DX: Unspecified urinary incontinence: R32

## 2015-11-11 LAB — COMPREHENSIVE METABOLIC PANEL
ALT: 22 U/L (ref 14–54)
AST: 25 U/L (ref 15–41)
Albumin: 3.8 g/dL (ref 3.5–5.0)
Alkaline Phosphatase: 88 U/L (ref 38–126)
Anion gap: 12 (ref 5–15)
BUN: 20 mg/dL (ref 6–20)
CHLORIDE: 106 mmol/L (ref 101–111)
CO2: 24 mmol/L (ref 22–32)
CREATININE: 1.45 mg/dL — AB (ref 0.44–1.00)
Calcium: 10.6 mg/dL — ABNORMAL HIGH (ref 8.9–10.3)
GFR, EST AFRICAN AMERICAN: 36 mL/min — AB (ref >60)
GFR, EST NON AFRICAN AMERICAN: 31 mL/min — AB (ref >60)
Glucose, Bld: 237 mg/dL — ABNORMAL HIGH (ref 65–99)
Potassium: 3.5 mmol/L (ref 3.5–5.1)
Sodium: 142 mmol/L (ref 135–145)
Total Bilirubin: 0.9 mg/dL (ref 0.3–1.2)
Total Protein: 7.5 g/dL (ref 6.5–8.1)

## 2015-11-11 LAB — CBC WITH DIFFERENTIAL/PLATELET
Basophils Absolute: 0 10*3/uL (ref 0.0–0.1)
Basophils Relative: 0 %
EOS PCT: 1 %
Eosinophils Absolute: 0.1 10*3/uL (ref 0.0–0.7)
HCT: 42.1 % (ref 36.0–46.0)
Hemoglobin: 13.1 g/dL (ref 12.0–15.0)
LYMPHS ABS: 2.1 10*3/uL (ref 0.7–4.0)
LYMPHS PCT: 30 %
MCH: 29.1 pg (ref 26.0–34.0)
MCHC: 31.1 g/dL (ref 30.0–36.0)
MCV: 93.6 fL (ref 78.0–100.0)
MONO ABS: 0.6 10*3/uL (ref 0.1–1.0)
MONOS PCT: 8 %
NEUTROS PCT: 61 %
Neutro Abs: 4.1 10*3/uL (ref 1.7–7.7)
PLATELETS: 224 10*3/uL (ref 150–400)
RBC: 4.5 MIL/uL (ref 3.87–5.11)
RDW: 14.7 % (ref 11.5–15.5)
WBC: 6.8 10*3/uL (ref 4.0–10.5)

## 2015-11-11 LAB — I-STAT TROPONIN, ED: Troponin i, poc: 0.11 ng/mL (ref 0.00–0.08)

## 2015-11-11 LAB — DIGOXIN LEVEL: Digoxin Level: <0.2 ng/mL — ABNORMAL LOW (ref 0.8–2.0)

## 2015-11-11 LAB — PROTIME-INR
INR: 2.83 — AB (ref 0.00–1.49)
PROTHROMBIN TIME: 29.3 s — AB (ref 11.6–15.2)

## 2015-11-11 LAB — BRAIN NATRIURETIC PEPTIDE: B Natriuretic Peptide: 1088.6 pg/mL — ABNORMAL HIGH (ref 0.0–100.0)

## 2015-11-11 MED ORDER — FUROSEMIDE 10 MG/ML IJ SOLN
40.0000 mg | Freq: Once | INTRAMUSCULAR | Status: AC
  Administered 2015-11-11: 40 mg via INTRAVENOUS
  Filled 2015-11-11: qty 4

## 2015-11-11 MED ORDER — ALBUTEROL SULFATE (2.5 MG/3ML) 0.083% IN NEBU
5.0000 mg | INHALATION_SOLUTION | Freq: Once | RESPIRATORY_TRACT | Status: AC
  Administered 2015-11-11: 5 mg via RESPIRATORY_TRACT

## 2015-11-11 MED ORDER — ALBUTEROL SULFATE (2.5 MG/3ML) 0.083% IN NEBU
INHALATION_SOLUTION | RESPIRATORY_TRACT | Status: AC
  Filled 2015-11-11: qty 6

## 2015-11-11 NOTE — ED Notes (Addendum)
Pt. reports worsening SOB with productive cough / fatigue onset 2 days ago , emesis this evening , denies fever or chills. Pt. seen at [facility] clinic today diagnosed with CHF and Chronic Atrial Fib.

## 2015-11-11 NOTE — ED Notes (Signed)
MD at bedside. 

## 2015-11-12 ENCOUNTER — Encounter: Payer: Self-pay | Admitting: Physician Assistant

## 2015-11-12 ENCOUNTER — Ambulatory Visit: Payer: Medicare Other | Admitting: Physician Assistant

## 2015-11-12 ENCOUNTER — Encounter (HOSPITAL_COMMUNITY): Payer: Self-pay | Admitting: Rehabilitation

## 2015-11-12 DIAGNOSIS — I350 Nonrheumatic aortic (valve) stenosis: Secondary | ICD-10-CM | POA: Diagnosis present

## 2015-11-12 DIAGNOSIS — I481 Persistent atrial fibrillation: Secondary | ICD-10-CM | POA: Diagnosis not present

## 2015-11-12 DIAGNOSIS — Z950 Presence of cardiac pacemaker: Secondary | ICD-10-CM | POA: Diagnosis not present

## 2015-11-12 DIAGNOSIS — E785 Hyperlipidemia, unspecified: Secondary | ICD-10-CM | POA: Diagnosis not present

## 2015-11-12 DIAGNOSIS — I5033 Acute on chronic diastolic (congestive) heart failure: Secondary | ICD-10-CM | POA: Diagnosis not present

## 2015-11-12 DIAGNOSIS — I5023 Acute on chronic systolic (congestive) heart failure: Secondary | ICD-10-CM | POA: Diagnosis present

## 2015-11-12 DIAGNOSIS — E1122 Type 2 diabetes mellitus with diabetic chronic kidney disease: Secondary | ICD-10-CM | POA: Diagnosis present

## 2015-11-12 DIAGNOSIS — Z7901 Long term (current) use of anticoagulants: Secondary | ICD-10-CM | POA: Diagnosis not present

## 2015-11-12 DIAGNOSIS — I509 Heart failure, unspecified: Secondary | ICD-10-CM | POA: Diagnosis not present

## 2015-11-12 DIAGNOSIS — M1990 Unspecified osteoarthritis, unspecified site: Secondary | ICD-10-CM | POA: Diagnosis present

## 2015-11-12 DIAGNOSIS — I130 Hypertensive heart and chronic kidney disease with heart failure and stage 1 through stage 4 chronic kidney disease, or unspecified chronic kidney disease: Secondary | ICD-10-CM | POA: Diagnosis present

## 2015-11-12 DIAGNOSIS — N183 Chronic kidney disease, stage 3 (moderate): Secondary | ICD-10-CM | POA: Diagnosis present

## 2015-11-12 DIAGNOSIS — R0602 Shortness of breath: Secondary | ICD-10-CM | POA: Diagnosis not present

## 2015-11-12 DIAGNOSIS — I4891 Unspecified atrial fibrillation: Secondary | ICD-10-CM | POA: Diagnosis present

## 2015-11-12 DIAGNOSIS — I482 Chronic atrial fibrillation: Secondary | ICD-10-CM | POA: Diagnosis present

## 2015-11-12 DIAGNOSIS — I429 Cardiomyopathy, unspecified: Secondary | ICD-10-CM | POA: Diagnosis present

## 2015-11-12 LAB — GLUCOSE, CAPILLARY
GLUCOSE-CAPILLARY: 197 mg/dL — AB (ref 65–99)
GLUCOSE-CAPILLARY: 156 mg/dL — AB (ref 65–99)
Glucose-Capillary: 122 mg/dL — ABNORMAL HIGH (ref 65–99)
Glucose-Capillary: 154 mg/dL — ABNORMAL HIGH (ref 65–99)

## 2015-11-12 LAB — BRAIN NATRIURETIC PEPTIDE: B NATRIURETIC PEPTIDE 5: 970.6 pg/mL — AB (ref 0.0–100.0)

## 2015-11-12 LAB — BASIC METABOLIC PANEL
ANION GAP: 11 (ref 5–15)
BUN: 20 mg/dL (ref 6–20)
CALCIUM: 10.1 mg/dL (ref 8.9–10.3)
CO2: 25 mmol/L (ref 22–32)
CREATININE: 1.3 mg/dL — AB (ref 0.44–1.00)
Chloride: 108 mmol/L (ref 101–111)
GFR, EST AFRICAN AMERICAN: 42 mL/min — AB (ref >60)
GFR, EST NON AFRICAN AMERICAN: 36 mL/min — AB (ref >60)
Glucose, Bld: 161 mg/dL — ABNORMAL HIGH (ref 65–99)
Potassium: 3.3 mmol/L — ABNORMAL LOW (ref 3.5–5.1)
SODIUM: 144 mmol/L (ref 135–145)

## 2015-11-12 LAB — CBC
HCT: 40.1 % (ref 36.0–46.0)
Hemoglobin: 12.5 g/dL (ref 12.0–15.0)
MCH: 29.2 pg (ref 26.0–34.0)
MCHC: 31.2 g/dL (ref 30.0–36.0)
MCV: 93.7 fL (ref 78.0–100.0)
PLATELETS: 202 10*3/uL (ref 150–400)
RBC: 4.28 MIL/uL (ref 3.87–5.11)
RDW: 14.8 % (ref 11.5–15.5)
WBC: 5.9 10*3/uL (ref 4.0–10.5)

## 2015-11-12 LAB — TSH: TSH: 2.251 u[IU]/mL (ref 0.350–4.500)

## 2015-11-12 MED ORDER — ACETAMINOPHEN 650 MG RE SUPP: 650.0000 mg | Freq: Four times a day (QID) | RECTAL | Status: DC | PRN

## 2015-11-12 MED ORDER — SODIUM CHLORIDE 0.9% FLUSH
3.0000 mL | Freq: Two times a day (BID) | INTRAVENOUS | Status: DC
  Administered 2015-11-12 – 2015-11-14 (×2): 3 mL via INTRAVENOUS

## 2015-11-12 MED ORDER — INSULIN ASPART 100 UNIT/ML ~~LOC~~ SOLN
0.0000 [IU] | Freq: Three times a day (TID) | SUBCUTANEOUS | Status: DC
  Administered 2015-11-12: 2 [IU] via SUBCUTANEOUS
  Administered 2015-11-12 (×2): 3 [IU] via SUBCUTANEOUS
  Administered 2015-11-13: 2 [IU] via SUBCUTANEOUS
  Administered 2015-11-13: 5 [IU] via SUBCUTANEOUS
  Administered 2015-11-13 – 2015-11-14 (×2): 2 [IU] via SUBCUTANEOUS
  Administered 2015-11-14: 3 [IU] via SUBCUTANEOUS
  Administered 2015-11-15: 2 [IU] via SUBCUTANEOUS

## 2015-11-12 MED ORDER — METOPROLOL TARTRATE 5 MG/5ML IV SOLN: 5.0000 mg | INTRAVENOUS | Status: DC | PRN

## 2015-11-12 MED ORDER — ONDANSETRON HCL 4 MG/2ML IJ SOLN: 4.0000 mg | Freq: Four times a day (QID) | INTRAMUSCULAR | Status: DC | PRN

## 2015-11-12 MED ORDER — SODIUM CHLORIDE 0.9% FLUSH: 3.0000 mL | INTRAVENOUS | Status: DC | PRN

## 2015-11-12 MED ORDER — ACETAMINOPHEN 325 MG PO TABS: 650.0000 mg | ORAL_TABLET | Freq: Four times a day (QID) | ORAL | Status: DC | PRN

## 2015-11-12 MED ORDER — DOCUSATE SODIUM 100 MG PO CAPS
100.0000 mg | ORAL_CAPSULE | Freq: Two times a day (BID) | ORAL | Status: DC
  Administered 2015-11-12 – 2015-11-15 (×7): 100 mg via ORAL
  Filled 2015-11-12 (×7): qty 1

## 2015-11-12 MED ORDER — GLIMEPIRIDE 1 MG PO TABS
0.5000 mg | ORAL_TABLET | Freq: Every day | ORAL | Status: DC
  Administered 2015-11-12 – 2015-11-15 (×4): 0.5 mg via ORAL
  Filled 2015-11-12 (×4): qty 0.5

## 2015-11-12 MED ORDER — ONDANSETRON HCL 4 MG PO TABS: 4.0000 mg | ORAL_TABLET | Freq: Four times a day (QID) | ORAL | Status: DC | PRN

## 2015-11-12 MED ORDER — METOPROLOL SUCCINATE ER 50 MG PO TB24
50.0000 mg | ORAL_TABLET | Freq: Every day | ORAL | Status: DC
  Administered 2015-11-12 – 2015-11-13 (×2): 50 mg via ORAL
  Filled 2015-11-12 (×2): qty 1

## 2015-11-12 MED ORDER — LISINOPRIL 10 MG PO TABS
20.0000 mg | ORAL_TABLET | Freq: Every day | ORAL | Status: DC
  Administered 2015-11-12 – 2015-11-13 (×2): 20 mg via ORAL
  Filled 2015-11-12 (×2): qty 2

## 2015-11-12 MED ORDER — POTASSIUM CHLORIDE CRYS ER 20 MEQ PO TBCR
40.0000 meq | EXTENDED_RELEASE_TABLET | Freq: Once | ORAL | Status: AC
  Administered 2015-11-12: 40 meq via ORAL
  Filled 2015-11-12: qty 2

## 2015-11-12 MED ORDER — SODIUM CHLORIDE 0.9 % IV SOLN: 250.0000 mL | INTRAVENOUS | Status: DC | PRN

## 2015-11-12 MED ORDER — WARFARIN SODIUM 3 MG PO TABS
6.0000 mg | ORAL_TABLET | ORAL | Status: DC
  Administered 2015-11-12: 6 mg via ORAL
  Filled 2015-11-12: qty 2

## 2015-11-12 MED ORDER — FUROSEMIDE 10 MG/ML IJ SOLN
40.0000 mg | Freq: Two times a day (BID) | INTRAMUSCULAR | Status: DC
  Administered 2015-11-12 – 2015-11-14 (×5): 40 mg via INTRAVENOUS
  Filled 2015-11-12 (×5): qty 4

## 2015-11-12 MED ORDER — WARFARIN SODIUM 3 MG PO TABS: 3.0000 mg | ORAL_TABLET | ORAL | Status: DC

## 2015-11-12 MED ORDER — SODIUM CHLORIDE 0.9% FLUSH
3.0000 mL | Freq: Two times a day (BID) | INTRAVENOUS | Status: DC
  Administered 2015-11-12 – 2015-11-14 (×5): 3 mL via INTRAVENOUS

## 2015-11-12 MED ORDER — ROSUVASTATIN CALCIUM 10 MG PO TABS
10.0000 mg | ORAL_TABLET | Freq: Every day | ORAL | Status: DC
  Administered 2015-11-12 – 2015-11-15 (×4): 10 mg via ORAL
  Filled 2015-11-12 (×4): qty 1

## 2015-11-12 MED ORDER — WARFARIN - PHARMACIST DOSING INPATIENT
Freq: Every day | Status: DC
  Administered 2015-11-12: 18:00:00

## 2015-11-12 MED ORDER — INSULIN ASPART 100 UNIT/ML ~~LOC~~ SOLN: 0.0000 [IU] | Freq: Every day | SUBCUTANEOUS | Status: DC

## 2015-11-12 MED ORDER — WARFARIN SODIUM 3 MG PO TABS: 3.0000 mg | ORAL_TABLET | Freq: Every day | ORAL | Status: DC

## 2015-11-12 MED ORDER — DILTIAZEM HCL ER COATED BEADS 120 MG PO CP24
120.0000 mg | ORAL_CAPSULE | Freq: Every day | ORAL | Status: DC
  Administered 2015-11-12 – 2015-11-13 (×2): 120 mg via ORAL
  Filled 2015-11-12 (×2): qty 1

## 2015-11-12 NOTE — Care Management Note (Signed)
Case Management Note Donn Pierini RN, BSN Unit 2W-Case Manager [phone]  Patient Details  Name: [name] MRN: [id number] Date of Birth: 04/16/1927  Subjective/Objective:   Pt admitted with afib/HF                 Action/Plan: PTA Pt lived at home - has home 02 baseline 2L- CM will follow for potential d/c needs.   Expected Discharge Date:                  Expected Discharge Plan:  Home w Home Health Services  In-House Referral:     Discharge planning Services  CM Consult  Post Acute Care Choice:  Home Health Choice offered to:  Patient, Adult Children  DME Arranged:    DME Agency:     HH Arranged:  RN, Nurse's Aide HH Agency:     Status of Service:  In process, will continue to follow  Medicare Important Message Given:    Date Medicare IM Given:    Medicare IM give by:    Date Additional Medicare IM Given:    Additional Medicare Important Message give by:     If discussed at Long Length of Stay Meetings, dates discussed:    Additional Comments:  11/12/15- 1500- Donn Pierini RN BSN- referral for HH-RN/aide- in to speak with pt at bedside- per conversation pt is agreeable to [facility] services- asks this CM to call and speak with [name] who is listed as her daughter or [name]- call attempted but no answer and was unable to leave message will try again later- pt reports that she uses RW at home. CM to f/u on [facility] services prior to discharge.  Darrold Span, RN 11/12/2015, 3:38 PM

## 2015-11-12 NOTE — Progress Notes (Signed)
Pt seen and examined at bedside, admitted after midnight, please see Dr. Joneen Roach admission note. Pt admitted for evaluation and management of dyspnea, noted to be in a-fib with RVR, diastolic CHF. Pt somnolent this am but family says pt is too tired and needs to rest. Will reassess her in AM.   Debbora Presto, MD  [facility] Pager [phone]  If 7PM-7AM, please contact night-coverage [email or web address] Password [email or web address]

## 2015-11-12 NOTE — Progress Notes (Signed)
Utilization review completed.  

## 2015-11-12 NOTE — Progress Notes (Signed)
ANTICOAGULATION CONSULT NOTE - Initial Consult  Pharmacy Consult for Warfarin  Indication: atrial fibrillation  No Known Allergies   Vital Signs: Temp: 97.9 F (36.6 C) (04/26 2134) Temp Source: Oral (04/26 2134) BP: 110/70 mmHg (04/27 0100) Pulse Rate: 83 (04/27 0100)  Labs:  Recent Labs  11/11/15 2139  HGB 13.1  HCT 42.1  PLT 224  LABPROT 29.3*  INR 2.83*  CREATININE 1.45*    CrCl cannot be calculated (Unknown ideal weight.).   Medical History: Past Medical History  Diagnosis Date  . Cardiomyopathy (HCC)     LVEF 40-45% August 2015  . Type 2 diabetes mellitus (HCC)   . Dyslipidemia   . Thyroid goiter   . Atrial fibrillation (HCC)     Coumadin  . Osteoarthritis   . Essential hypertension   . Aortic stenosis     Moderate to severe August 2015     Assessment: 82 y/o F here with shortness of breath, warfarin PTA for afib, INR therapeutic at 2.83  Outpatient warfarin dose 6 mg on Mon/Wed/Thurs, 3 mg all other days  Goal of Therapy:  INR 2-3 Monitor platelets by anticoagulation protocol: Yes   Plan:  -Warfarin per outpatient regimen -Daily PT/INR, adjust dose as needed -Monitor for bleeding  Abran Duke 11/12/2015,2:18 AM

## 2015-11-12 NOTE — Care Management Note (Addendum)
Case Management Note Donn Pierini RN, BSN Unit 2W-Case Manager [phone]  Patient Details  Name: [name] MRN: [id number] Date of Birth: 1926/12/18  Subjective/Objective:   Pt admitted with afib/HF                 Action/Plan: PTA Pt lived at home - has home 02 baseline 2L- CM will follow for potential d/c needs.   Expected Discharge Date:                  Expected Discharge Plan:  Home/Self Care  In-House Referral:     Discharge planning Services  CM Consult  Post Acute Care Choice:    Choice offered to:     DME Arranged:    DME Agency:     HH Arranged:    HH Agency:     Status of Service:  In process, will continue to follow  Medicare Important Message Given:    Date Medicare IM Given:    Medicare IM give by:    Date Additional Medicare IM Given:    Additional Medicare Important Message give by:     If discussed at Long Length of Stay Meetings, dates discussed:    Additional Comments:  Darrold Span, RN 11/12/2015, 10:22 AM

## 2015-11-12 NOTE — ED Provider Notes (Signed)
CSN: [id number]     Arrival date & time 11/11/15  2126 History   First MD Initiated Contact with Patient 11/11/15 2203     Chief Complaint  Patient presents with  . Shortness of Breath     Patient is a 82 y.o. female presenting with shortness of breath. The history is provided by the patient and a relative. No language interpreter was used.  Shortness of Breath  [name] is a 82 y.o. female who presents to the Emergency Department complaining of SOB.  History is provided by the patient and her daughters. She has had a few days to a week of bilateral lower extremity edema, facial swelling. She has increased shortness of breath and congestion for the last week. She saw her PCP today and was started on Lasix. This evening she developed nausea after eating fish. She had associated severe discomfort that is difficult to describe but feels like it needs to come up. She denies any fevers, chest pain, diarrhea.  Past Medical History  Diagnosis Date  . Cardiomyopathy (HCC)     LVEF 40-45% August 2015  . Type 2 diabetes mellitus (HCC)   . Dyslipidemia   . Thyroid goiter   . Atrial fibrillation (HCC)     Coumadin  . Osteoarthritis   . Essential hypertension   . Aortic stenosis     Moderate to severe August 2015    Past Surgical History  Procedure Laterality Date  . Pacemaker insertion      St Jude CRT-P ([facility])  . Cardiac catheterization    . Abdominal hysterectomy     Family History  Problem Relation Age of Onset  . Hypertension Brother    Social History  Substance Use Topics  . Smoking status: Never Smoker   . Smokeless tobacco: None  . Alcohol Use: No   OB History    No data available     Review of Systems  Respiratory: Positive for shortness of breath.   All other systems reviewed and are negative.     Allergies  Review of patient's allergies indicates no known allergies.  Home Medications   Prior to Admission medications   Medication Sig Start Date  End Date Taking? Authorizing Provider  calcitRIOL (ROCALTROL) 0.25 MCG capsule TAKE 1 CAPSULE (0.25 MCG TOTAL) BY MOUTH DAILY. 06/08/15  Yes Duke Salvia, MD  digoxin (LANOXIN) 0.125 MG tablet Take 0.125 mg by mouth daily.   Yes Historical Provider, MD  diltiazem (CARDIZEM CD) 120 MG 24 hr capsule TAKE 1 CAPSULE BY MOUTH DAILY 10/20/14  Yes Duke Salvia, MD  diltiazem (CARDIZEM CD) 120 MG 24 hr capsule TAKE 1 CAPSULE BY MOUTH DAILY 05/27/15  Yes Duke Salvia, MD  furosemide (LASIX) 20 MG tablet TAKE 1 TABLET BY MOUTH 2 TIMES DAILY 10/15/15  Yes Duke Salvia, MD  glimepiride (AMARYL) 1 MG tablet Take 0.5 mg by mouth daily with breakfast.   Yes Historical Provider, MD  lisinopril (PRINIVIL,ZESTRIL) 20 MG tablet Take 20 mg by mouth daily.   Yes Historical Provider, MD  metoprolol succinate (TOPROL-XL) 50 MG 24 hr tablet TAKE 1 TABLET (50 MG TOTAL) BY MOUTH DAILY. TAKE WITH OR IMMEDIATELY FOLLOWING A MEAL. 10/19/15  Yes Duke Salvia, MD  potassium chloride SA (K-DUR,KLOR-CON) 20 MEQ tablet Take 10 mEq by mouth daily.    Yes Historical Provider, MD  rosuvastatin (CRESTOR) 10 MG tablet Take 10 mg by mouth daily.   Yes Historical Provider, MD  warfarin (COUMADIN)  3 MG tablet Take 1-2 tablets (3-6 mg total) by mouth daily. Take 3 mg on Monday, Wednesday, Friday, then take 6 mg all the other days Patient taking differently: Take 3-6 mg by mouth daily. Take 6 mg on Monday, Wednesday, Thursday, then take 3 mg all the other days 09/30/14  Yes Christiane Ha, MD   BP 120/62 mmHg  Pulse 100  Temp(Src) 97.9 F (36.6 C) (Oral)  Resp 17  SpO2 99% Physical Exam  Constitutional: She is oriented to person, place, and time. She appears well-developed and well-nourished.  HENT:  Head: Normocephalic and atraumatic.  Cardiovascular:  No murmur heard. Tachycardic, irregular  Pulmonary/Chest: No respiratory distress.  Tachypnea with occasional rhonchi bilaterally  Abdominal: Soft. There is no tenderness.  There is no rebound and no guarding.  Musculoskeletal: She exhibits no tenderness.  2+pitting edema in BLE  Neurological: She is alert and oriented to person, place, and time.  Skin: Skin is warm and dry.  Psychiatric: She has a normal mood and affect. Her behavior is normal.  Nursing note and vitals reviewed.   ED Course  Procedures (including critical care time) Labs Review Labs Reviewed  COMPREHENSIVE METABOLIC PANEL - Abnormal; Notable for the following:    Glucose, Bld 237 (*)    Creatinine, Ser 1.45 (*)    Calcium 10.6 (*)    GFR calc non Af Amer 31 (*)    GFR calc Af Amer 36 (*)    All other components within normal limits  PROTIME-INR - Abnormal; Notable for the following:    Prothrombin Time 29.3 (*)    INR 2.83 (*)    All other components within normal limits  BRAIN NATRIURETIC PEPTIDE - Abnormal; Notable for the following:    B Natriuretic Peptide 1088.6 (*)    All other components within normal limits  DIGOXIN LEVEL - Abnormal; Notable for the following:    Digoxin Level <0.2 (*)    All other components within normal limits  I-STAT TROPOININ, ED - Abnormal; Notable for the following:    Troponin i, poc 0.11 (*)    All other components within normal limits  CBC WITH DIFFERENTIAL/PLATELET    Imaging Review Dg Chest 2 View  11/11/2015  CLINICAL DATA:  Acute shortness of breath. EXAM: CHEST  2 VIEW COMPARISON:  September 27, 2014. FINDINGS: Stable cardiomegaly. Left-sided pacemaker is unchanged in position. Atherosclerosis of thoracic aorta is noted. No pneumothorax is noted. No pleural effusion is noted. Bony thorax is unremarkable. No acute pulmonary disease is noted. Eventration of right hemidiaphragm is noted anteriorly. IMPRESSION: No active cardiopulmonary disease. Electronically Signed   By: Lupita Raider, M.D.   On: 11/11/2015 14:45   I have personally reviewed and evaluated these images and lab results as part of my medical decision-making.   EKG  Interpretation   Date/Time:  Thursday November 12 2015 00:19:23 EDT Ventricular Rate:  97 PR Interval:    QRS Duration: 106 QT Interval:  385 QTC Calculation: 489 R Axis:   84 Text Interpretation:  Atrial fibrillation Borderline right axis deviation  Borderline repolarization abnormality Borderline prolonged QT interval  Confirmed by Lincoln Brigham [phone]) on 11/12/2015 12:24:07 AM      MDM   Final diagnoses:  Acute congestive heart failure, unspecified congestive heart failure type [facility])    Patient with history of chronic atrial fibrillation here with increased shortness of breath and nausea. She is volume overloaded on examination and in A. fib with RVR. She feels mildly  improved after albuterol treatment. She had an outpatient chest x-ray performed earlier today with no acute disease. Treating with Lasix for her volume overload. On repeat assessment following Lasix patient is resting comfortably with no respiratory distress. Her heart rate has improved to 80s to 90s. Plan to admit to hospitalist service for further treatment.    Tilden Fossa, MD 11/12/15 [phone]

## 2015-11-12 NOTE — H&P (Signed)
PCP:   Shamleffer, Landry Mellow, MD   Chief Complaint:  Increasing shortness of breath  HPI: This is a 82 year old female who lives at home with her daughters. Per daughter she's been more short of breath approximately a week. She does have chronic shortness of breath but this is worse. There is no reporst of any wheezing or coughing. They also noticed increased edema in her ankle today which is new. She has been walking more slowly. She walks with a walker. She is complaining of worsening insomnia. There is no complaints of any chest pains, fever chills or altered mentation. The family brought her to ER today because of her shortness of breath. In the ER the patient was found to be in A. fib with RVR. She's been given IV Lasix and diltiazem and she is resting more comfortably. The hospitalist have been asked to admit. History provided by the patient's daughters were present at bedside.  Review of Systems:  The patient denies anorexia, fever, weight loss,, vision loss, decreased hearing, hoarseness, chest pain, syncope, dyspnea on exertion, peripheral edema, balance deficits, hemoptysis, abdominal pain, melena, hematochezia, severe indigestion/heartburn, hematuria, incontinence, genital sores, muscle weakness, suspicious skin lesions, transient blindness, difficulty walking, depression, unusual weight change, abnormal bleeding, enlarged lymph nodes, angioedema, and breast masses.  Past Medical History: Past Medical History  Diagnosis Date  . Cardiomyopathy (HCC)     LVEF 40-45% August 2015  . Type 2 diabetes mellitus (HCC)   . Dyslipidemia   . Thyroid goiter   . Atrial fibrillation (HCC)     Coumadin  . Osteoarthritis   . Essential hypertension   . Aortic stenosis     Moderate to severe August 2015    Past Surgical History  Procedure Laterality Date  . Pacemaker insertion      St Jude CRT-P ([facility])  . Cardiac catheterization    . Abdominal hysterectomy       Medications: Prior to Admission medications   Medication Sig Start Date End Date Taking? Authorizing Provider  calcitRIOL (ROCALTROL) 0.25 MCG capsule TAKE 1 CAPSULE (0.25 MCG TOTAL) BY MOUTH DAILY. 06/08/15   Duke Salvia, MD  diltiazem (CARDIZEM CD) 120 MG 24 hr capsule TAKE 1 CAPSULE BY MOUTH DAILY 10/20/14   Duke Salvia, MD  diltiazem [facility] CD) 120 MG 24 hr capsule TAKE 1 CAPSULE BY MOUTH DAILY 05/27/15   Duke Salvia, MD  furosemide (LASIX) 20 MG tablet TAKE 1 TABLET BY MOUTH 2 TIMES DAILY 10/15/15   Duke Salvia, MD  glimepiride (AMARYL) 1 MG tablet Take 0.5 mg by mouth daily with breakfast.    Historical Provider, MD  lisinopril (PRINIVIL,ZESTRIL) 20 MG tablet Take 20 mg by mouth daily.    Historical Provider, MD  metoprolol succinate (TOPROL-XL) 50 MG 24 hr tablet TAKE 1 TABLET (50 MG TOTAL) BY MOUTH DAILY. TAKE WITH OR IMMEDIATELY FOLLOWING A MEAL. 10/19/15   Duke Salvia, MD  potassium chloride SA (K-DUR,KLOR-CON) 20 MEQ tablet Take 10 mEq by mouth daily. 1/2 tab daily    Historical Provider, MD  rosuvastatin (CRESTOR) 10 MG tablet Take 10 mg by mouth daily.    Historical Provider, MD  warfarin (COUMADIN) 3 MG tablet Take 1-2 tablets (3-6 mg total) by mouth daily. Take 3 mg on Monday, Wednesday, Friday, then take 6 mg all the other days 09/30/14   Christiane Ha, MD    Allergies:  No Known Allergies  Social History:  reports that she has never smoked. She  does not have any smokeless tobacco history on file. She reports that she does not drink alcohol or use illicit drugs.  Family History: Family History  Problem Relation Age of Onset  . Hypertension Brother     Physical Exam: Filed Vitals:   11/11/15 2200 11/11/15 2200 11/11/15 2258 11/11/15 2300  BP: 163/105   159/92  Pulse: 111   102  Temp:      TempSrc:      Resp: 23   27  SpO2: 100% 100% 89% 99%    General:  Alert and oriented times three, well developed and nourished, no acute distress Eyes:  PERRLA, pink conjunctiva, no scleral icterus ENT: Moist oral mucosa, neck supple, no thyromegaly Lungs: clear to ascultation, no wheeze, no crackles, no use of accessory muscles Cardiovascular: irrregular rate and rhythm, no regurgitation, no gallops, no murmurs. No carotid bruits, JVD Abdomen: soft, positive BS, non-tender, non-distended, no organomegaly, not an acute abdomen GU: not examined Neuro: CN II - XII grossly intact, sensation intact Musculoskeletal: strength 5/5 all extremities, no clubbing, cyanosis or 1+ B/L LEedema Skin: no rash, no subcutaneous crepitation, no decubitus Psych: appropriate patient   Labs on Admission:   Recent Labs  11/11/15 2139  NA 142  K 3.5  CL 106  CO2 24  GLUCOSE 237*  BUN 20  CREATININE 1.45*  CALCIUM 10.6*    Recent Labs  11/11/15 2139  AST 25  ALT 22  ALKPHOS 88  BILITOT 0.9  PROT 7.5  ALBUMIN 3.8   No results for input(s): LIPASE, AMYLASE in the last 72 hours.  Recent Labs  11/11/15 2139  WBC 6.8  NEUTROABS 4.1  HGB 13.1  HCT 42.1  MCV 93.6  PLT 224   No results for input(s): CKTOTAL, CKMB, CKMBINDEX, TROPONINI in the last 72 hours. Invalid input(s): POCBNP No results for input(s): DDIMER in the last 72 hours. No results for input(s): HGBA1C in the last 72 hours. No results for input(s): CHOL, HDL, LDLCALC, TRIG, CHOLHDL, LDLDIRECT in the last 72 hours. No results for input(s): TSH, T4TOTAL, T3FREE, THYROIDAB in the last 72 hours.  Invalid input(s): FREET3 No results for input(s): VITAMINB12, FOLATE, FERRITIN, TIBC, IRON, RETICCTPCT in the last 72 hours.  Micro Results: No results found for this or any previous visit (from the past 240 hour(s)).   Radiological Exams on Admission: Dg Chest 2 View  11/11/2015  CLINICAL DATA:  Acute shortness of breath. EXAM: CHEST  2 VIEW COMPARISON:  September 27, 2014. FINDINGS: Stable cardiomegaly. Left-sided pacemaker is unchanged in position. Atherosclerosis of thoracic aorta is  noted. No pneumothorax is noted. No pleural effusion is noted. Bony thorax is unremarkable. No acute pulmonary disease is noted. Eventration of right hemidiaphragm is noted anteriorly. IMPRESSION: No active cardiopulmonary disease. Electronically Signed   By: Lupita Raider, M.D.   On: 11/11/2015 14:45   EKG: A fib with RVR  Assessment/Plan Present on Admission:  . Atrial fibrillation with RVR (HCC) -Admit to [facility] -Resume home medications, when necessary Lopressor ordered -Resume Coumadin, pharmacy to dose -TSH in a.m.  CHF systolic dysfunction/fluid overload last EF 45-50% -Change Lasix IV 20 mg twice a day -Daily weights, strict I's and O's  . Elevated troponin -This is chronic. Monitor  Mild acute and chronic kidney disease -Monitor especially patient on Lasix -ACE inhibitor not held currently  . Dyslipidemia -Stable, resume home medications  . Hypertension -stable, resume home medications  Diabetes mellitus -ADA diet, sliding-scale insulin -Diet controlled  Sokha Craker 11/12/2015, 12:07 AM

## 2015-11-13 ENCOUNTER — Encounter (HOSPITAL_COMMUNITY): Payer: Self-pay | Admitting: General Practice

## 2015-11-13 DIAGNOSIS — E785 Hyperlipidemia, unspecified: Secondary | ICD-10-CM

## 2015-11-13 DIAGNOSIS — I5033 Acute on chronic diastolic (congestive) heart failure: Secondary | ICD-10-CM

## 2015-11-13 DIAGNOSIS — I481 Persistent atrial fibrillation: Secondary | ICD-10-CM

## 2015-11-13 DIAGNOSIS — I4891 Unspecified atrial fibrillation: Secondary | ICD-10-CM

## 2015-11-13 LAB — CBC
HCT: 38.9 % (ref 36.0–46.0)
HEMOGLOBIN: 12 g/dL (ref 12.0–15.0)
MCH: 29.3 pg (ref 26.0–34.0)
MCHC: 30.8 g/dL (ref 30.0–36.0)
MCV: 94.9 fL (ref 78.0–100.0)
PLATELETS: 178 10*3/uL (ref 150–400)
RBC: 4.1 MIL/uL (ref 3.87–5.11)
RDW: 14.9 % (ref 11.5–15.5)
WBC: 4.7 10*3/uL (ref 4.0–10.5)

## 2015-11-13 LAB — GLUCOSE, CAPILLARY
GLUCOSE-CAPILLARY: 133 mg/dL — AB (ref 65–99)
GLUCOSE-CAPILLARY: 144 mg/dL — AB (ref 65–99)
Glucose-Capillary: 242 mg/dL — ABNORMAL HIGH (ref 65–99)
Glucose-Capillary: 141 mg/dL — ABNORMAL HIGH (ref 65–99)

## 2015-11-13 LAB — BASIC METABOLIC PANEL
ANION GAP: 9 (ref 5–15)
BUN: 21 mg/dL — ABNORMAL HIGH (ref 6–20)
CHLORIDE: 110 mmol/L (ref 101–111)
CO2: 25 mmol/L (ref 22–32)
Calcium: 10.1 mg/dL (ref 8.9–10.3)
Creatinine, Ser: 1.38 mg/dL — ABNORMAL HIGH (ref 0.44–1.00)
GFR, EST AFRICAN AMERICAN: 39 mL/min — AB (ref >60)
GFR, EST NON AFRICAN AMERICAN: 33 mL/min — AB (ref >60)
Glucose, Bld: 136 mg/dL — ABNORMAL HIGH (ref 65–99)
POTASSIUM: 3.7 mmol/L (ref 3.5–5.1)
SODIUM: 144 mmol/L (ref 135–145)

## 2015-11-13 LAB — PROTIME-INR
INR: 3.75 — ABNORMAL HIGH (ref 0.00–1.49)
PROTHROMBIN TIME: 36.2 s — AB (ref 11.6–15.2)

## 2015-11-13 MED ORDER — METOPROLOL SUCCINATE ER 50 MG PO TB24
50.0000 mg | ORAL_TABLET | Freq: Two times a day (BID) | ORAL | Status: DC
  Administered 2015-11-13 – 2015-11-15 (×4): 50 mg via ORAL
  Filled 2015-11-13 (×4): qty 1

## 2015-11-13 MED ORDER — LISINOPRIL 10 MG PO TABS
10.0000 mg | ORAL_TABLET | Freq: Every day | ORAL | Status: DC
  Administered 2015-11-14 – 2015-11-15 (×2): 10 mg via ORAL
  Filled 2015-11-13 (×2): qty 1

## 2015-11-13 MED ORDER — DILTIAZEM HCL ER COATED BEADS 120 MG PO CP24
120.0000 mg | ORAL_CAPSULE | Freq: Every day | ORAL | Status: DC
  Administered 2015-11-14 – 2015-11-15 (×2): 120 mg via ORAL
  Filled 2015-11-13 (×2): qty 1

## 2015-11-13 NOTE — Evaluation (Signed)
Physical Therapy Evaluation Patient Details Name: [name] MRN: [id number] DOB: January 09, 1927 Today's Date: 11/13/2015   History of Present Illness  Pt adm with acute on chronic heart failure. PMH - permanent afib, pacer, DM, cardiomyopathy, aortic stenosis  Clinical Impression  Pt admitted with above diagnosis and presents to PT with functional limitations due to deficits listed below (See PT problem list). Pt needs skilled PT to maximize independence and safety to allow discharge to home with supportive family. Will follow acutely but feel pt is very close to baseline and will not need PT after dc.     Follow Up Recommendations No PT follow up;Supervision for mobility/OOB    Equipment Recommendations  3in1 (PT)    Recommendations for Other Services       Precautions / Restrictions Precautions Precautions: Fall Restrictions Weight Bearing Restrictions: No      Mobility  Bed Mobility Overal bed mobility: Needs Assistance Bed Mobility: Supine to Sit     Supine to sit: Min guard        Transfers Overall transfer level: Needs assistance Equipment used: 4-wheeled walker Transfers: Sit to/from Stand Sit to Stand: Min assist         General transfer comment: Assist for balance  Ambulation/Gait Ambulation/Gait assistance: Min guard Ambulation Distance (Feet): 200 Feet Assistive device: 4-wheeled walker Gait Pattern/deviations: Step-through pattern;Decreased step length - right;Decreased step length - left;Shuffle;Trunk flexed   Gait velocity interpretation: <1.8 ft/sec, indicative of risk for recurrent falls General Gait Details: Assist for balance. Amb on 2L of O2. Dyspnea 2/4 after ambulation  Stairs            Wheelchair Mobility    Modified Rankin (Stroke Patients Only)       Balance Overall balance assessment: Needs assistance Sitting-balance support: No upper extremity supported;Feet supported Sitting balance-Leahy Scale: Fair      Standing balance support: Bilateral upper extremity supported Standing balance-Leahy Scale: Poor Standing balance comment: support of walker and supervision                             Pertinent Vitals/Pain Pain Assessment: No/denies pain    Home Living Family/patient expects to be discharged to:: Private residence Living Arrangements: Children Available Help at Discharge: Family;Available 24 hours/day Type of Home: House Home Access: Ramped entrance     Home Layout: One level Home Equipment: Walker - 4 wheels;Hospital bed;Wheelchair - Manufacturing systems engineer      Prior Function Level of Independence: Needs assistance   Gait / Transfers Assistance Needed: Amb with rollator and supervision           Hand Dominance        Extremity/Trunk Assessment   Upper Extremity Assessment: Generalized weakness           Lower Extremity Assessment: Generalized weakness         Communication   Communication: No difficulties  Cognition Arousal/Alertness: Awake/alert Behavior During Therapy: WFL for tasks assessed/performed Overall Cognitive Status: Within Functional Limits for tasks assessed                      General Comments      Exercises        Assessment/Plan    PT Assessment Patient needs continued PT services  PT Diagnosis Difficulty walking;Generalized weakness   PT Problem List Decreased strength;Decreased activity tolerance;Decreased balance;Decreased mobility  PT Treatment Interventions DME instruction;Gait training;Functional mobility training;Therapeutic exercise;Therapeutic activities;Balance training;Patient/family education  PT Goals (Current goals can be found in the Care Plan section) Acute Rehab PT Goals Patient Stated Goal: Return home PT Goal Formulation: With patient Time For Goal Achievement: 11/20/15 Potential to Achieve Goals: Good    Frequency Min 3X/week   Barriers to discharge        Co-evaluation                End of Session Equipment Utilized During Treatment: Gait belt;Oxygen Activity Tolerance: Patient tolerated treatment well Patient left: in chair;with call bell/phone within reach;with family/visitor present Nurse Communication: Mobility status         Time: 3435-6861 PT Time Calculation (min) (ACUTE ONLY): 25 min   Charges:   PT Evaluation $PT Eval Moderate Complexity: 1 Procedure PT Treatments $Gait Training: 8-22 mins   PT G Codes:        Nakiah Osgood 2015/11/25, 2:07 PM  [facility] PT [phone]

## 2015-11-13 NOTE — Care Management Note (Signed)
Case Management Note Donn Pierini RN, BSN Unit 2W-Case Manager [phone]  Patient Details  Name: [name] MRN: [id number] Date of Birth: [birth year]  Subjective/Objective:   Pt admitted with afib/HF                 Action/Plan: PTA Pt lived at home - has home 02 baseline 2L- CM will follow for potential d/c needs.   Expected Discharge Date:                  Expected Discharge Plan:  Home w Home Health Services  In-House Referral:     Discharge planning Services  CM Consult  Post Acute Care Choice:  Home Health Choice offered to:  Patient, Adult Children  DME Arranged:  Bedside commode DME Agency:     HH Arranged:  RN, Nurse's Aide HH Agency:  [facility]  Status of Service:  In process, will continue to follow  Medicare Important Message Given:    Date Medicare IM Given:    Medicare IM give by:    Date Additional Medicare IM Given:    Additional Medicare Important Message give by:     If discussed at Long Length of Stay Meetings, dates discussed:    Additional Comments:  11/13/15- 1030- Harvest Stanco RN, BSN- spoke with pt and daughter [name] at bedside- choice offered for [facility] services in [facility]- per choice they would like to use [facility] for services - referral called to [facility] with [facility]- for HH-RN/aide- family states that they have RW, W/C, shower chair and hospital bed at home- they would like a [facility] for home- will ask MD for DME order- pt does not have home 02 at home- if needed would need to qualify for it here and would need order for home 02.   11/12/15- 1500- Donn Pierini RN BSN- referral for HH-RN/aide- in to speak with pt at bedside- per conversation pt is agreeable to [facility] services- asks this CM to call and speak with [name] who is listed as her daughter or [name]- call attempted but no answer and was unable to leave message will try again later- pt reports that she uses RW at home. CM to f/u on [facility] services prior to discharge.  Darrold Span, RN 11/13/2015, 1:32 PM

## 2015-11-13 NOTE — Progress Notes (Signed)
Per pharmacist note - Coumadin dose held tonight.

## 2015-11-13 NOTE — Progress Notes (Addendum)
Patient ID: [name], female   DOB: November 12, 1926, 82 y.o.   MRN: [id number]    PROGRESS NOTE    [name]  WUJ:[id number] DOB: January 19, 1927 DOA: 11/11/2015  PCP: Tommy Rainwater, MD   Outpatient Specialists:   Brief Narrative:  82 y.o. female with known non ischemic cardiomyopathy, diabetes, hyperlipidemia, aortic stenosis, hypertension, and permanent atrial fibrillation, s/p PPM implant for tachy/brady syndrome, presented to [facility] ED for evaluation of progressively worsening dyspnea. ECHO 09/2014 demonstrated EF 45-50%, moderate AS, LA severely dilated.  Assessment & Plan:  1. Permanent atrial fibrillation, CHADS2VASC score 6 - continue to monitor on telemetry - cardiology consulted - continue coumadin   2. Acute on chronic systolic heart failure - improved with Lasix, denies dyspnea this AM  - continue IV Lasix and follow up on Cardiology team recommendations  - Continue BB/ACE-I  3. HTN, essential - reasonable inpatient control   4. Chronic kidney disease, stage II - III - with GFR as low as 30 - 50's in the past year and Cr 1.2 - 1.6 - BMP In AM  5. Dyslipidemia - continue statin   6. 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   Objective: Filed Vitals:   11/12/15 1100 11/12/15 2055 11/13/15 0300 11/13/15 0628  BP: 113/59 99/68  151/91  Pulse:  77  88  Temp:  97.8 F (36.6 C)  98.1 F (36.7 C)  TempSrc:  Oral  Oral  Resp:  20  20  Weight:   69.627 kg (153 lb 8 oz)   SpO2:  100%  93%    Intake/Output Summary (Last 24 hours) at 11/13/15 0719 Last data filed at 11/12/15 2200  Gross per 24 hour  Intake    103 ml  Output       0 ml  Net    103 ml   Filed Weights   11/12/15 0248 11/13/15 0300  Weight: 72 kg (158 lb 11.7 oz) 69.627 kg (153 lb 8 oz)    Examination:  General exam: Appears calm and comfortable  Respiratory system: Respiratory effort normal. Cardiovascular system: IRRR. No rubs, gallops or clicks. No pedal edema. Gastrointestinal system: Abdomen is nondistended, soft and nontender Central nervous system: Alert and oriented. No focal neurological deficits.   Data Reviewed: I have personally reviewed following labs and imaging studies  CBC:  Recent Labs Lab 11/11/15 2139 11/12/15 0240 11/13/15 0248  WBC 6.8 5.9 4.7  NEUTROABS 4.1  --   --   HGB 13.1 12.5 12.0  HCT 42.1 40.1 38.9  MCV 93.6 93.7 94.9  PLT 224 202 178   Basic Metabolic Panel:  Recent Labs Lab 11/11/15 2139 11/12/15 0240 11/13/15 0248  NA 142 144 144  K 3.5 3.3* 3.7  CL 106 108 110  CO2 GLUCOSE 237* 161* 136*  BUN 20 20 21*  CREATININE 1.45* 1.30* 1.38*  CALCIUM 10.6* 10.1 10.1   Liver Function Tests:  Recent Labs Lab 11/11/15 2139  AST 25  ALT 22  ALKPHOS 88  BILITOT 0.9  PROT 7.5  ALBUMIN 3.8   Coagulation Profile:  Recent Labs Lab 11/11/15 2139 11/13/15 0248  INR 2.83*  3.75*   CBG:  Recent Labs Lab 11/12/15 0631 11/12/15 1243 11/12/15 1702 11/12/15 2049 11/13/15 0622  GLUCAP 122* 154* 197* 156* 133*   Thyroid Function Tests:  Recent Labs  11/12/15 0240  TSH 2.251   Urine analysis:    Component Value Date/Time   COLORURINE YELLOW 09/27/2014 1157   APPEARANCEUR CLEAR 09/27/2014 1157   LABSPEC 1.012 09/27/2014 1157   PHURINE 6.5 09/27/2014 1157   GLUCOSEU NEGATIVE 09/27/2014 1157   HGBUR TRACE* 09/27/2014 1157   BILIRUBINUR NEGATIVE 09/27/2014 1157   KETONESUR NEGATIVE 09/27/2014 1157   PROTEINUR NEGATIVE 09/27/2014 1157   UROBILINOGEN 0.2 09/27/2014 1157   NITRITE NEGATIVE 09/27/2014 1157   LEUKOCYTESUR NEGATIVE 09/27/2014 1157    Radiology  Studies: Dg Chest 2 View  11/11/2015  CLINICAL DATA:  Acute shortness of breath. EXAM: CHEST  2 VIEW COMPARISON:  September 27, 2014. FINDINGS: Stable cardiomegaly. Left-sided pacemaker is unchanged in position. Atherosclerosis of thoracic aorta is noted. No pneumothorax is noted. No pleural effusion is noted. Bony thorax is unremarkable. No acute pulmonary disease is noted. Eventration of right hemidiaphragm is noted anteriorly. IMPRESSION: No active cardiopulmonary disease. Electronically Signed   By: Lupita Raider, M.D.   On: 11/11/2015 14:45      Scheduled Meds: . diltiazem  120 mg Oral Daily  . docusate sodium  100 mg Oral BID  . furosemide  40 mg Intravenous Q12H  . glimepiride  0.5 mg Oral Q breakfast  . insulin aspart  0-15 Units Subcutaneous TID WC  . insulin aspart  0-5 Units Subcutaneous QHS  . lisinopril  20 mg Oral Daily  . metoprolol succinate  50 mg Oral Daily  . rosuvastatin  10 mg Oral Daily  . sodium chloride flush  3 mL Intravenous Q12H  . sodium chloride flush  3 mL Intravenous Q12H  . warfarin  3 mg Oral Once per day on Sun Tue Fri Sat  . warfarin  6 mg Oral Once per day on Mon Wed Thu  . Warfarin - Pharmacist Dosing Inpatient   Does not apply q1800   Continuous Infusions:    LOS: 1 day    Time spent: 20 minutes    Debbora Presto, MD [facility] Pager [phone]  If 7PM-7AM, please contact night-coverage [email or web address] Password [facility] 11/13/2015, 7:19 AM

## 2015-11-13 NOTE — Consult Note (Signed)
ELECTROPHYSIOLOGY CONSULT NOTE    Patient ID: [name] MRN: [id number], DOB/AGE: 80-25-1928 80 y.o.  Admit date: 11/11/2015 Date of Consult: 11/13/2015  Primary Physician: Tommy Rainwater, MD Primary Cardiologist: Graciela Husbands Requesting Physician: Izola Price  Reason for Consultation: AF, NSVT  HPI:  [name] is a 82 y.o. female with a past medical history significant for non ischemic cardiomyopathy, diabetes, hyperlipidemia, aortic stenosis, hypertension, and permanent atrial fibrillation.  She is s/p PPM implant for tachy/brady syndrome. She reports that for the last 2 weeks she has had progressive shortness of breath, orthopnea, and LE edema. She presented to the hospital on the day of admission for further evaluation. She has been diuresed 700cc since admission and is feeling improved. She still has some shortness of breath with exertion and is clearly more comfortable sitting rather than lying down.    Telemetry has demonstrated atrial fibrillation with occasional ventricular pacing. EP has been asked to evaluate for treatment options.  She denies recent chest pain. She has had some nausea, no vomiting.  No palpitations, dizziness or pre-syncope. She lives with her daughter and is fairly sedentary at home.   Echo 09/2014 demonstrated EF 45-50%, moderate AS, LA severely dilated, PA pressure 52.   Past Medical History  Diagnosis Date  . Cardiomyopathy (HCC)     LVEF 40-45% August 2015  . Type 2 diabetes mellitus (HCC)   . Dyslipidemia   . Thyroid goiter   . Atrial fibrillation (HCC)     Coumadin  . Osteoarthritis   . Essential hypertension   . Aortic stenosis     Moderate to severe August 2015      Surgical History:  Past Surgical History  Procedure Laterality Date  . Pacemaker insertion      St Jude CRT-P ([place])  . Cardiac catheterization    . Abdominal hysterectomy       Prescriptions prior to admission  Medication Sig Dispense Refill Last Dose    . calcitRIOL (ROCALTROL) 0.25 MCG capsule TAKE 1 CAPSULE (0.25 MCG TOTAL) BY MOUTH DAILY. 30 capsule 6 11/11/2015 at Unknown time  . digoxin (LANOXIN) 0.125 MG tablet Take 0.125 mg by mouth daily.   11/11/2015 at Unknown time  . diltiazem (CARDIZEM CD) 120 MG 24 hr capsule TAKE 1 CAPSULE BY MOUTH DAILY 30 capsule 6 11/11/2015 at Unknown time  . diltiazem (CARDIZEM CD) 120 MG 24 hr capsule TAKE 1 CAPSULE BY MOUTH DAILY 30 capsule 1 11/11/2015 at Unknown time  . furosemide (LASIX) 20 MG tablet TAKE 1 TABLET BY MOUTH 2 TIMES DAILY 180 tablet 3 11/11/2015 at Unknown time  . glimepiride (AMARYL) 1 MG tablet Take 0.5 mg by mouth daily with breakfast.   11/11/2015 at Unknown time  . lisinopril (PRINIVIL,ZESTRIL) 20 MG tablet Take 20 mg by mouth daily.   11/11/2015 at Unknown time  . metoprolol succinate (TOPROL-XL) 50 MG 24 hr tablet TAKE 1 TABLET (50 MG TOTAL) BY MOUTH DAILY. TAKE WITH OR IMMEDIATELY FOLLOWING A MEAL. 30 tablet 8 11/11/2015 at 0730  . potassium chloride SA (K-DUR,KLOR-CON) 20 MEQ tablet Take 10 mEq by mouth daily.    11/11/2015 at Unknown time  . rosuvastatin (CRESTOR) 10 MG tablet Take 10 mg by mouth daily.   11/11/2015 at Unknown time  . warfarin (COUMADIN) 3 MG tablet Take 1-2 tablets (3-6 mg total) by mouth daily. Take 3 mg on Monday, Wednesday, Friday, then take 6 mg all the other days (Patient taking differently: Take 3-6 mg by mouth  daily. Take 6 mg on Monday, Wednesday, Thursday, then take 3 mg all the other days)   11/11/2015 at Unknown time    Inpatient Medications:  . diltiazem  120 mg Oral Daily  . docusate sodium  100 mg Oral BID  . furosemide  40 mg Intravenous Q12H  . glimepiride  0.5 mg Oral Q breakfast  . insulin aspart  0-15 Units Subcutaneous TID WC  . insulin aspart  0-5 Units Subcutaneous QHS  . lisinopril  20 mg Oral Daily  . metoprolol succinate  50 mg Oral Daily  . rosuvastatin  10 mg Oral Daily  . sodium chloride flush  3 mL Intravenous Q12H  . sodium chloride flush   3 mL Intravenous Q12H  . Warfarin - Pharmacist Dosing Inpatient   Does not apply q1800    Allergies: No Known Allergies  Social History   Social History  . Marital Status: Widowed    Spouse Name: N/A  . Number of Children: N/A  . Years of Education: N/A   Occupational History  . Not on file.   Social History Main Topics  . Smoking status: Never Smoker   . Smokeless tobacco: Not on file  . Alcohol Use: No  . Drug Use: No  . Sexual Activity: Not on file   Other Topics Concern  . Not on file   Social History Narrative     Family History  Problem Relation Age of Onset  . Hypertension Brother      Review of Systems: All other systems reviewed and are otherwise negative except as noted above.  Physical Exam: Filed Vitals:   11/12/15 1100 11/12/15 2055 11/13/15 0300 11/13/15 0628  BP: 113/59 99/68  151/91  Pulse:  77  88  Temp:  97.8 F (36.6 C)  98.1 F (36.7 C)  TempSrc:  Oral  Oral  Resp:  20  20  Weight:   153 lb 8 oz (69.627 kg)   SpO2:  100%  93%     GEN- The patient is elderly and chronically ill appearing, alert and oriented x 3 today.   HEENT: normocephalic, atraumatic; sclera clear, conjunctiva pink; hearing intact; oropharynx clear; neck supple  Lungs- Clear to ausculation bilaterally, normal work of breathing.  No wheezes, rales, rhonchi Heart- Irregular rate and rhythm  GI- soft, non-tender, non-distended, bowel sounds present  Extremities- no clubbing, cyanosis, or edema; DP/PT/radial pulses 2+ bilaterally MS- no significant deformity or atrophy Skin- warm and dry, no rash or lesion Psych- euthymic mood, full affect Neuro- strength and sensation are intact  Labs:   Lab Results  Component Value Date   WBC 4.7 11/13/2015   HGB 12.0 11/13/2015   HCT 38.9 11/13/2015   MCV 94.9 11/13/2015   PLT 178 11/13/2015    Recent Labs Lab 11/11/15 2139  11/13/15 0248  NA 142  < > 144  K 3.5  < > 3.7  CL 106  < > 110  CO2 24  < > 25  BUN 20  <  > 21*  CREATININE 1.45*  < > 1.38*  CALCIUM 10.6*  < > 10.1  PROT 7.5  --   --   BILITOT 0.9  --   --   ALKPHOS 88  --   --   ALT 22  --   --   AST 25  --   --   GLUCOSE 237*  < > 136*  < > = values in this interval not displayed.  Radiology/Studies: Dg Chest 2 View 11/11/2015  CLINICAL DATA:  Acute shortness of breath. EXAM: CHEST  2 VIEW COMPARISON:  September 27, 2014. FINDINGS: Stable cardiomegaly. Left-sided pacemaker is unchanged in position. Atherosclerosis of thoracic aorta is noted. No pneumothorax is noted. No pleural effusion is noted. Bony thorax is unremarkable. No acute pulmonary disease is noted. Eventration of right hemidiaphragm is noted anteriorly. IMPRESSION: No active cardiopulmonary disease. Electronically Signed   By: Lupita Raider, M.D.   On: 11/11/2015 14:45    OAC:ZYSAYT fibrillation, rate 97  TELEMETRY: rate controlled atrial fibrillation with occasional ventricular pacing   Assessment/Plan: 1.  Permanent atrial fibrillation Rate controlled by recent device interrogation and telemetry She has a CRTP and is only CRT pacing 19% of the time chronically.  Previous attempts at increasing rate control were associated with increased shortness of breath.  With adequate rate control at last office visit, LV pacing was turned off.  Continue Warfarin for CHADS2VASC of 6  2.  Acute on chronic systolic heart failure Symptoms improved some with diuresis Continue IV lasix another 24 hours as renal function allows Continue BB/ACE-I  3.  HTN Stable No change required today   Dr Graciela Husbands to see later today Electrophysiology team to see as needed while here. Please call with questions.  Signed, Gypsy Balsam, NP 11/13/2015 10:36 AM   Seen and examined Will try and augment rate control  Decrease lisinopril as has had some low BP  Continue IV diuresis as kidneys allow Try and get off O2

## 2015-11-13 NOTE — Progress Notes (Signed)
ANTICOAGULATION CONSULT NOTE  Pharmacy Consult for Warfarin  Indication: atrial fibrillation  No Known Allergies   Vital Signs: Temp: 98.1 F (36.7 C) (04/28 0628) Temp Source: Oral (04/28 0628) BP: 151/91 mmHg (04/28 0628) Pulse Rate: 88 (04/28 0628)  Labs:  Recent Labs  11/11/15 2139 11/12/15 0240 11/13/15 0248  HGB 13.1 12.5 12.0  HCT 42.1 40.1 38.9  PLT 224 202 178  LABPROT 29.3*  --  36.2*  INR 2.83*  --  3.75*  CREATININE 1.45* 1.30* 1.38*    Estimated Creatinine Clearance: 27.5 mL/min (by C-G formula based on Cr of 1.38).   Medical History: Past Medical History  Diagnosis Date  . Cardiomyopathy (HCC)     LVEF 40-45% August 2015  . Type 2 diabetes mellitus (HCC)   . Dyslipidemia   . Thyroid goiter   . Atrial fibrillation (HCC)     Coumadin  . Osteoarthritis   . Essential hypertension   . Aortic stenosis     Moderate to severe August 2015     Assessment: 82 y/o F here with shortness of breath, warfarin PTA for afib, INR up at 3.75 (possibly due to HF)  Outpatient warfarin dose 6 mg on Mon/Wed/Thurs, 3 mg all other days  Goal of Therapy:  INR 2-3 Monitor platelets by anticoagulation protocol: Yes   Plan:  -Hold coumadin today --Daily PT/INR  Harland German, Pharm D 11/13/2015 8:10 AM

## 2015-11-14 DIAGNOSIS — I509 Heart failure, unspecified: Secondary | ICD-10-CM

## 2015-11-14 LAB — CBC
HEMATOCRIT: 41.4 % (ref 36.0–46.0)
Hemoglobin: 12.5 g/dL (ref 12.0–15.0)
MCH: 29.3 pg (ref 26.0–34.0)
MCHC: 30.2 g/dL (ref 30.0–36.0)
MCV: 97 fL (ref 78.0–100.0)
PLATELETS: 173 10*3/uL (ref 150–400)
RBC: 4.27 MIL/uL (ref 3.87–5.11)
RDW: 14.7 % (ref 11.5–15.5)
WBC: 4.6 10*3/uL (ref 4.0–10.5)

## 2015-11-14 LAB — BASIC METABOLIC PANEL
Anion gap: 9 (ref 5–15)
BUN: 20 mg/dL (ref 6–20)
CO2: 28 mmol/L (ref 22–32)
Calcium: 9.9 mg/dL (ref 8.9–10.3)
Chloride: 107 mmol/L (ref 101–111)
Creatinine, Ser: 1.3 mg/dL — ABNORMAL HIGH (ref 0.44–1.00)
GFR calc Af Amer: 42 mL/min — ABNORMAL LOW (ref >60)
GFR, EST NON AFRICAN AMERICAN: 36 mL/min — AB (ref >60)
GLUCOSE: 147 mg/dL — AB (ref 65–99)
POTASSIUM: 3.7 mmol/L (ref 3.5–5.1)
Sodium: 144 mmol/L (ref 135–145)

## 2015-11-14 LAB — GLUCOSE, CAPILLARY
GLUCOSE-CAPILLARY: 121 mg/dL — AB (ref 65–99)
GLUCOSE-CAPILLARY: 114 mg/dL — AB (ref 65–99)
GLUCOSE-CAPILLARY: 157 mg/dL — AB (ref 65–99)
Glucose-Capillary: 197 mg/dL — ABNORMAL HIGH (ref 65–99)

## 2015-11-14 LAB — PROTIME-INR
INR: 3.7 — AB (ref 0.00–1.49)
PROTHROMBIN TIME: 35.8 s — AB (ref 11.6–15.2)

## 2015-11-14 MED ORDER — FUROSEMIDE 40 MG PO TABS
40.0000 mg | ORAL_TABLET | Freq: Two times a day (BID) | ORAL | Status: DC
  Administered 2015-11-14 – 2015-11-15 (×2): 40 mg via ORAL
  Filled 2015-11-14 (×2): qty 1

## 2015-11-14 NOTE — Progress Notes (Signed)
ANTICOAGULATION CONSULT NOTE  Pharmacy Consult for Warfarin  Indication: atrial fibrillation  No Known Allergies   Vital Signs: Temp: 97.8 F (36.6 C) (04/29 0535) Temp Source: Oral (04/29 0535) BP: 120/70 mmHg (04/29 0535) Pulse Rate: 72 (04/29 0535)  Labs:  Recent Labs  11/11/15 2139 11/12/15 0240 11/13/15 0248 11/14/15 0306 11/14/15 0827  HGB 13.1 12.5 12.0  --  12.5  HCT 42.1 40.1 38.9  --  41.4  PLT 224 202 178  --  173  LABPROT 29.3*  --  36.2* 35.8*  --   INR 2.83*  --  3.75* 3.70*  --   CREATININE 1.45* 1.30* 1.38*  --  1.30*    Estimated Creatinine Clearance: 29.3 mL/min (by C-G formula based on Cr of 1.3).   Medical History: Past Medical History  Diagnosis Date  . Cardiomyopathy (HCC)     LVEF 40-45% August 2015  . Type 2 diabetes mellitus (HCC)   . Dyslipidemia   . Thyroid goiter   . Atrial fibrillation (HCC)     Coumadin  . Osteoarthritis   . Essential hypertension   . Aortic stenosis     Moderate to severe August 2015   . Incontinent of urine     Assessment: 82 y/o F here with shortness of breath, warfarin PTA for afib, INR up at 3.7 (possibly due to HF)  Outpatient warfarin dose 6 mg on Mon/Wed/Thurs, 3 mg all other days  Goal of Therapy:  INR 2-3 Monitor platelets by anticoagulation protocol: Yes   Plan:  -Hold coumadin today --Daily PT/INR  Harland German, Pharm D 11/14/2015 10:31 AM

## 2015-11-14 NOTE — Progress Notes (Signed)
Patient lying in bed, daughters present at bedside. Call light within reach 

## 2015-11-14 NOTE — Progress Notes (Addendum)
Patient ID: [name], female   DOB: 1927/03/22, 82 y.o.   MRN: [id number]    PROGRESS NOTE    [name]  [id number]:[id number] DOB: 06-Mar-1927 DOA: 11/11/2015  PCP: Laura Rainwater, MD   Outpatient Specialists:   Brief Narrative:  82 y.o. female with known non ischemic cardiomyopathy, diabetes, hyperlipidemia, aortic stenosis, hypertension, and permanent atrial fibrillation, s/p PPM implant for tachy/brady syndrome, presented to [facility] ED for evaluation of progressively worsening dyspnea. ECHO 09/2014 demonstrated EF 45-50%, moderate AS, LA severely dilated.  Assessment & Plan:  1. Permanent atrial fibrillation, CHADS2VASC score 6 - rate controlled, pt feels better, asymptomatic  - cardiology following, assistance is appreciated  - continue coumadin per pharmacy   2. Acute on chronic systolic heart failure - improved with Lasix, denies dyspnea this AM  - change lasix to PO per cardiology  - Continue BB/ACE-I, CCB  3. HTN, essential - reasonable inpatient control  - on Lisinopril and Metoprolol, Lasix, Cardizem   4. Chronic kidney disease, stage II - III - with GFR as low as 30 - 50's in the past year and Cr 1.2 - 1.6 - Cr at baseline   5. Dyslipidemia - continue statin   6. Diabetes mellitus with complications of CKD stage II - ADA diet, sliding-scale insulin - Diet controlled   DVT prophylaxis: on Coumadin  Code Status: Full  Family Communication: Patient and family at bedside  Disposition Plan: Possibly in am 4/20  Consultants:   Cardiology   Procedures:   None   Antimicrobials:   None  Subjective: Reports feeling better.   Objective: Filed Vitals:   11/13/15 2136 11/13/15 2145 11/14/15 0500 11/14/15 0535  BP: 110/79 132/82  120/70  Pulse:    72  Temp:    97.8 F (36.6 C)  TempSrc:    Oral  Resp:    20  Weight:   70 kg (154 lb 5.2 oz)   SpO2:    99%    Intake/Output Summary (Last 24 hours) at 11/14/15 0718 Last data filed at  11/14/15 0533  Gross per 24 hour  Intake    253 ml  Output   1100 ml  Net   -847 ml   Filed Weights   11/12/15 0248 11/13/15 0300 11/14/15 0500  Weight: 72 kg (158 lb 11.7 oz) 69.627 kg (153 lb 8 oz) 70 kg (154 lb 5.2 oz)    Examination:  General exam: Appears calm and comfortable  Respiratory system: Respiratory effort normal. Cardiovascular system: IRRR. No rubs, gallops or clicks. No pedal edema. Gastrointestinal system: Abdomen is nondistended, soft and nontender Central nervous system: Alert and oriented. No focal neurological deficits.   Data Reviewed: I have personally reviewed following labs and imaging studies  CBC:  Recent Labs Lab 11/11/15 2139 11/12/15 0240 11/13/15 0248  WBC 6.8 5.9 4.7  NEUTROABS 4.1  --   --   HGB 13.1 12.5 12.0  HCT 42.1 40.1 38.9  MCV 93.6 93.7 94.9  PLT 224 202 178   Basic Metabolic Panel:  Recent Labs Lab 11/11/15 2139 11/12/15 0240 11/13/15 0248  NA 142 144 144  K 3.5 3.3* 3.7  CL 106 108 110  CO2 24 25 25   GLUCOSE 237* 161* 136*  BUN 20 20 21*  CREATININE 1.45* 1.30* 1.38*  CALCIUM 10.6* 10.1 10.1   Liver Function Tests:  Recent Labs Lab 11/11/15 2139  AST 25  ALT 22  ALKPHOS 88  BILITOT 0.9  PROT 7.5  ALBUMIN 3.8   Coagulation Profile:  Recent Labs Lab 11/11/15 2139 11/13/15 0248 11/14/15 0306  INR 2.83* 3.75* 3.70*   CBG:  Recent Labs Lab 11/13/15 0622 11/13/15 1122 11/13/15 1649 11/13/15 2258 11/14/15 0628  GLUCAP 133* 242* 141* 144* 121*   Thyroid Function Tests:  Recent Labs  11/12/15 0240  TSH 2.251   Urine analysis:    Component Value Date/Time   COLORURINE YELLOW 09/27/2014 1157   APPEARANCEUR CLEAR 09/27/2014 1157   LABSPEC 1.012 09/27/2014 1157   PHURINE 6.5 09/27/2014 1157   GLUCOSEU NEGATIVE 09/27/2014 1157   HGBUR TRACE* 09/27/2014 1157   BILIRUBINUR NEGATIVE 09/27/2014 1157   KETONESUR NEGATIVE 09/27/2014 1157   PROTEINUR NEGATIVE 09/27/2014 1157   UROBILINOGEN 0.2  09/27/2014 1157   NITRITE NEGATIVE 09/27/2014 1157   LEUKOCYTESUR NEGATIVE 09/27/2014 1157    Radiology Studies: No results found.    Scheduled Meds: . diltiazem  120 mg Oral Daily  . docusate sodium  100 mg Oral BID  . furosemide  40 mg Intravenous Q12H  . glimepiride  0.5 mg Oral Q breakfast  . insulin aspart  0-15 Units Subcutaneous TID WC  . insulin aspart  0-5 Units Subcutaneous QHS  . lisinopril  10 mg Oral Daily  . metoprolol succinate  50 mg Oral BID  . rosuvastatin  10 mg Oral Daily  . sodium chloride flush  3 mL Intravenous Q12H  . sodium chloride flush  3 mL Intravenous Q12H  . Warfarin - Pharmacist Dosing Inpatient   Does not apply q1800   Continuous Infusions:    LOS: 2 days    Time spent: 20 minutes    Debbora Presto, MD [facility] Pager [phone]  If 7PM-7AM, please contact night-coverage [email or web address] Password [facility] 11/14/2015, 7:18 AM

## 2015-11-14 NOTE — Progress Notes (Signed)
Patient Name: [name]      SUBJECTIVE: feels better  Past Medical History  Diagnosis Date  . Cardiomyopathy (HCC)     LVEF 40-45% August 2015  . Type 2 diabetes mellitus (HCC)   . Dyslipidemia   . Thyroid goiter   . Atrial fibrillation (HCC)     Coumadin  . Osteoarthritis   . Essential hypertension   . Aortic stenosis     Moderate to severe August 2015   . Incontinent of urine     Scheduled Meds:  Scheduled Meds: . diltiazem  120 mg Oral Daily  . docusate sodium  100 mg Oral BID  . furosemide  40 mg Intravenous Q12H  . glimepiride  0.5 mg Oral Q breakfast  . insulin aspart  0-15 Units Subcutaneous TID WC  . insulin aspart  0-5 Units Subcutaneous QHS  . lisinopril  10 mg Oral Daily  . metoprolol succinate  50 mg Oral BID  . rosuvastatin  10 mg Oral Daily  . sodium chloride flush  3 mL Intravenous Q12H  . sodium chloride flush  3 mL Intravenous Q12H  . Warfarin - Pharmacist Dosing Inpatient   Does not apply q1800   Continuous Infusions:  sodium chloride, acetaminophen **OR** acetaminophen, metoprolol, ondansetron **OR** ondansetron (ZOFRAN) IV, sodium chloride flush    PHYSICAL EXAM Filed Vitals:   11/13/15 2136 11/13/15 2145 11/14/15 0500 11/14/15 0535  BP: 110/79 132/82  120/70  Pulse:    72  Temp:    97.8 F (36.6 C)  TempSrc:    Oral  Resp:    20  Weight:   154 lb 5.2 oz (70 kg)   SpO2:    99%    Well developed and nourished in no acute distress lokds much better HENT normal Neck supple with JVP-flat Carotids brisk and full without bruits Clear Irregularly irregular rate and rhythm with controlled ventricular response, no murmurs or gallops Abd-soft with active BS without hepatomegaly No Clubbing cyanosis edema Skin-warm and dry A & Oriented  Grossly normal sensory and motor function   TELEMETRY: Reviewed telemetry pt in af controlled ventricualr response:    Intake/Output Summary (Last 24 hours) at 11/14/15 1042 Last data  filed at 11/14/15 0846  Gross per 24 hour  Intake    253 ml  Output    550 ml  Net   -297 ml    LABS: Basic Metabolic Panel:  Recent Labs Lab 11/11/15 2139 11/12/15 0240 11/13/15 0248 11/14/15 0827  NA 142 144 144 144  K 3.5 3.3* 3.7 3.7  CL 106 108 110 107  CO2 24 25 25 28   GLUCOSE 237* 161* 136* 147*  BUN 20 20 21* 20  CREATININE 1.45* 1.30* 1.38* 1.30*  CALCIUM 10.6* 10.1 10.1 9.9   Cardiac Enzymes: No results for input(s): CKTOTAL, CKMB, CKMBINDEX, TROPONINI in the last 72 hours. CBC:  Recent Labs Lab 11/11/15 2139 11/12/15 0240 11/13/15 0248 11/14/15 0827  WBC 6.8 5.9 4.7 4.6  NEUTROABS 4.1  --   --   --   HGB 13.1 12.5 12.0 12.5  HCT 42.1 40.1 38.9 41.4  MCV 93.6 93.7 94.9 97.0  PLT 224 202 178 173   PROTIME:  Recent Labs  11/11/15 2139 11/13/15 0248 11/14/15 0306  LABPROT 29.3* 36.2* 35.8*  INR 2.83* 3.75* 3.70*   Liver Function Tests:  Recent Labs  11/11/15 2139  AST 25  ALT 22  ALKPHOS 88  BILITOT 0.9  PROT 7.5  ALBUMIN 3.8   No results for input(s): LIPASE, AMYLASE in the last 72 hours. BNP: BNP (last 3 results)  Recent Labs  11/11/15 2228 11/12/15 0240  BNP 1088.6* 970.6*    ProBNP (last 3 results) No results for input(s): PROBNP in the last 8760 hours.  D-Dimer: No results for input(s): DDIMER in the last 72 hours. Hemoglobin A1C: No results for input(s): HGBA1C in the last 72 hours. Fasting Lipid Panel: No results for input(s): CHOL, HDL, LDLCALC, TRIG, CHOLHDL, LDLDIRECT in the last 72 hours. Thyroid Function Tests:  Recent Labs  11/12/15 0240  TSH 2.251   Anemia Panel: No results for input(s): VITAMINB12, FOLATE, FERRITIN, TIBC, IRON, RETICCTPCT in the last 72 hours.     ASSESSMENT AND PLAN:  Active Problems:   Elevated troponin   CHF (congestive heart failure) (HCC)   Dyslipidemia   A-fib (HCC)   Hypertension   DM (diabetes mellitus) (HCC)   Aortic stenosis, moderate   Atrial fibrillation with  RVR (HCC)  Continue duresis change to po   Hopefully home in am Continue rate control  improved Signed, Sherryl Manges MD  11/14/2015

## 2015-11-14 NOTE — Discharge Instructions (Signed)
Heart Failure  Heart failure means your heart has trouble pumping blood. This makes it hard for your body to work well. Heart failure is usually a long-term (chronic) condition. You must take good care of yourself and follow your doctor's treatment plan.  HOME CARE   Take your heart medicine as told by your doctor.    Do not stop taking medicine unless your doctor tells you to.    Do not skip any dose of medicine.    Refill your medicines before they run out.    Take other medicines only as told by your doctor or pharmacist.   Stay active if told by your doctor. The elderly and people with severe heart failure should talk with a doctor about physical activity.   Eat heart-healthy foods. Choose foods that are without trans fat and are low in saturated fat, cholesterol, and salt (sodium). This includes fresh or frozen fruits and vegetables, fish, lean meats, fat-free or low-fat dairy foods, whole grains, and high-fiber foods. Lentils and dried peas and beans (legumes) are also good choices.   Limit salt if told by your doctor.   Cook in a healthy way. Roast, grill, broil, bake, poach, steam, or stir-fry foods.   Limit fluids as told by your doctor.   Weigh yourself every morning. Do this after you pee (urinate) and before you eat breakfast. Write down your weight to give to your doctor.   Take your blood pressure and write it down if your doctor tells you to.   Ask your doctor how to check your pulse. Check your pulse as told.   Lose weight if told by your doctor.   Stop smoking or chewing tobacco. Do not use gum or patches that help you quit without your doctor's approval.   Schedule and go to doctor visits as told.   Nonpregnant women should have no more than 1 drink a day. Men should have no more than 2 drinks a day. Talk to your doctor about drinking alcohol.   Stop illegal drug use.   Stay current with shots (immunizations).   Manage your health conditions as told by your doctor.   Learn to  manage your stress.   Rest when you are tired.   If it is really hot outside:    Avoid intense activities.    Use air conditioning or fans, or get in a cooler place.    Avoid caffeine and alcohol.    Wear loose-fitting, lightweight, and light-colored clothing.   If it is really cold outside:    Avoid intense activities.    Layer your clothing.    Wear mittens or gloves, a hat, and a scarf when going outside.    Avoid alcohol.   Learn about heart failure and get support as needed.   Get help to maintain or improve your quality of life and your ability to care for yourself as needed.  GET HELP IF:    You gain weight quickly.   You are more short of breath than usual.   You cannot do your normal activities.   You tire easily.   You cough more than normal, especially with activity.   You have any or more puffiness (swelling) in areas such as your hands, feet, ankles, or belly (abdomen).   You cannot sleep because it is hard to breathe.   You feel like your heart is beating fast (palpitations).   You get dizzy or light-headed when you stand up.  GET HELP   RIGHT AWAY IF:    You have trouble breathing.   There is a change in mental status, such as becoming less alert or not being able to focus.   You have chest pain or discomfort.   You faint.  MAKE SURE YOU:    Understand these instructions.   Will watch your condition.   Will get help right away if you are not doing well or get worse.     This information is not intended to replace advice given to you by your health care provider. Make sure you discuss any questions you have with your health care provider.     Document Released: 04/12/2008 Document Revised: 07/25/2014 Document Reviewed: 08/20/2012  Elsevier Interactive Patient Education 2016 [employer]

## 2015-11-15 LAB — CBC
HCT: 39.6 % (ref 36.0–46.0)
HEMOGLOBIN: 12.3 g/dL (ref 12.0–15.0)
MCH: 29.9 pg (ref 26.0–34.0)
MCHC: 31.1 g/dL (ref 30.0–36.0)
MCV: 96.4 fL (ref 78.0–100.0)
PLATELETS: 169 10*3/uL (ref 150–400)
RBC: 4.11 MIL/uL (ref 3.87–5.11)
RDW: 14.5 % (ref 11.5–15.5)
WBC: 4.4 10*3/uL (ref 4.0–10.5)

## 2015-11-15 LAB — PROTIME-INR
INR: 2.58 — AB (ref 0.00–1.49)
PROTHROMBIN TIME: 27.3 s — AB (ref 11.6–15.2)

## 2015-11-15 LAB — BASIC METABOLIC PANEL
Anion gap: 10 (ref 5–15)
BUN: 21 mg/dL — ABNORMAL HIGH (ref 6–20)
CHLORIDE: 105 mmol/L (ref 101–111)
CO2: 25 mmol/L (ref 22–32)
CREATININE: 1.17 mg/dL — AB (ref 0.44–1.00)
Calcium: 10 mg/dL (ref 8.9–10.3)
GFR, EST AFRICAN AMERICAN: 47 mL/min — AB (ref >60)
GFR, EST NON AFRICAN AMERICAN: 41 mL/min — AB (ref >60)
Glucose, Bld: 137 mg/dL — ABNORMAL HIGH (ref 65–99)
Potassium: 3.4 mmol/L — ABNORMAL LOW (ref 3.5–5.1)
SODIUM: 140 mmol/L (ref 135–145)

## 2015-11-15 LAB — GLUCOSE, CAPILLARY: GLUCOSE-CAPILLARY: 121 mg/dL — AB (ref 65–99)

## 2015-11-15 MED ORDER — METOPROLOL SUCCINATE ER 50 MG PO TB24: 50.0000 mg | ORAL_TABLET | Freq: Two times a day (BID) | ORAL | Status: DC

## 2015-11-15 MED ORDER — LISINOPRIL 10 MG PO TABS: 10.0000 mg | ORAL_TABLET | Freq: Every day | ORAL | Status: DC

## 2015-11-15 MED ORDER — FUROSEMIDE 40 MG PO TABS: 40.0000 mg | ORAL_TABLET | Freq: Two times a day (BID) | ORAL | Status: DC

## 2015-11-15 MED ORDER — WARFARIN SODIUM 3 MG PO TABS: 6.0000 mg | ORAL_TABLET | Freq: Once | ORAL | Status: DC

## 2015-11-15 NOTE — Progress Notes (Signed)
ANTICOAGULATION CONSULT NOTE  Pharmacy Consult for Warfarin  Indication: atrial fibrillation  No Known Allergies   Vital Signs: Temp: 98.2 F (36.8 C) (04/30 0613) Temp Source: Oral (04/30 5916) BP: 129/108 mmHg (04/30 3846) Pulse Rate: 88 (04/30 0613)  Labs:  Recent Labs  11/13/15 0248 11/14/15 0306 11/14/15 0827 11/15/15 0303  HGB 12.0  --  12.5 12.3  HCT 38.9  --  41.4 39.6  PLT 178  --  173 169  LABPROT 36.2* 35.8*  --  27.3*  INR 3.75* 3.70*  --  2.58*  CREATININE 1.38*  --  1.30* 1.17*    Estimated Creatinine Clearance: 32.3 mL/min (by C-G formula based on Cr of 1.17).   Medical History: Past Medical History  Diagnosis Date  . Cardiomyopathy (HCC)     LVEF 40-45% August 2015  . Type 2 diabetes mellitus (HCC)   . Dyslipidemia   . Thyroid goiter   . Atrial fibrillation (HCC)     Coumadin  . Osteoarthritis   . Essential hypertension   . Aortic stenosis     Moderate to severe August 2015   . Incontinent of urine     Assessment: 82 y/o F here with shortness of breath, warfarin PTA for afib, INR down to 2.58  Outpatient warfarin dose 6 mg on Mon/Wed/Thurs, 3 mg all other days  Goal of Therapy:  INR 2-3 Monitor platelets by anticoagulation protocol: Yes   Plan:  -6mg  coumadin today --Daily PT/INR  Harland German, Pharm D 11/15/2015 10:08 AM

## 2015-11-15 NOTE — Progress Notes (Signed)
Order received to discharge.  Telemetry removed and CCMD notified.  IV removed with catheter intact.  Discharge education given to Pt with family at bedside.  Handouts given to caregivers on atrial fibrillation and heart failure.  Handout given on new medication furosemide.   All discharge questions answered.  Pt denies chest pain or sob at discharge.  Pt stable with no s/s of distress.

## 2015-11-15 NOTE — Discharge Summary (Signed)
Physician Discharge Summary  [name]:[id number] DOB: 10/17/1926 DOA: 11/11/2015  PCP: Tommy Rainwater, MD  Admit date: 11/11/2015 Discharge date: 11/15/2015  Recommendations for Outpatient Follow-up:  1. Pt will need to follow up with PCP in 1-2 weeks post discharge 2. Please obtain BMP to evaluate electrolytes and kidney function 3. Please also check CBC to evaluate Hg and Hct levels 4. Please note that the dose of Lisinopril was lowered from 20 mg PO QD to 10 mg PO QD per cardiology team recommendation, due to lower BP  Discharge Diagnoses:  Active Problems:   Elevated troponin   CHF (congestive heart failure) (HCC)   Dyslipidemia   A-fib (HCC)   Hypertension   DM (diabetes mellitus) (HCC)   Aortic stenosis, moderate   Atrial fibrillation with RVR (HCC)   Acute congestive heart failure (HCC)  Discharge Condition: Stable  Diet recommendation: Heart healthy diet discussed in details   Brief Narrative:  82 y.o. female with known non ischemic cardiomyopathy, diabetes, hyperlipidemia, aortic stenosis, hypertension, and permanent atrial fibrillation, s/p PPM implant for tachy/brady syndrome, presented to [facility] ED for evaluation of progressively worsening dyspnea. ECHO 09/2014 demonstrated EF 45-50%, moderate AS, LA severely dilated.  Assessment & Plan:  1. Permanent atrial fibrillation, CHADS2VASC score 6 - rate controlled, pt feels better, asymptomatic  - continue coumadin as per home medical regimen   2. Acute on chronic systolic heart failure - improved with Lasix, denies dyspnea this AM  - changed lasix to PO per cardiology  - Continue BB/ACE-I, CCB  3. HTN, essential - reasonable inpatient control  - on Lisinopril and Metoprolol, Lasix, Cardizem   4. Chronic kidney disease, stage II - III - with GFR as low as 30 - 50's in the past year and Cr 1.2 - 1.6 - Cr at baseline   5. Dyslipidemia - continue statin   6. Diabetes mellitus with  complications of CKD stage II - ADA diet, sliding-scale insulin - Diet controlled   DVT prophylaxis: on Coumadin  Code Status: Full  Family Communication: Patient and family at bedside  Disposition Plan: home  Consultants:   Cardiology  Procedures:   None  Antimicrobials:   None  Discharge Exam: Filed Vitals:   11/14/15 2041 11/15/15 0613  BP: 128/83 129/108  Pulse: 91 88  Temp: 98.3 F (36.8 C) 98.2 F (36.8 C)  Resp: 20 20   Filed Vitals:   11/14/15 0535 11/14/15 1358 11/14/15 2041 11/15/15 0613  BP: 120/70  128/83 129/108  Pulse: 72  91 88  Temp: 97.8 F (36.6 C)  98.3 F (36.8 C) 98.2 F (36.8 C)  TempSrc: Oral  Oral Oral  Resp: 20  20 20   Weight:    68.947 kg (152 lb)  SpO2: 99% 99% 95% 95%    General: Pt is alert, follows commands appropriately, not in acute distress Cardiovascular: Regular rate and rhythm, no rubs, no gallops Respiratory: Clear to auscultation bilaterally, no wheezing, no crackles, no rhonchi Abdominal: Soft, non tender, non distended, bowel sounds +, no guarding Extremities: no edema, no cyanosis, pulses palpable bilaterally DP and PT   Discharge Instructions     Medication List    STOP taking these medications        digoxin 0.125 MG tablet  Commonly known as:  LANOXIN      TAKE these medications        calcitRIOL 0.25 MCG capsule  Commonly known as:  ROCALTROL  TAKE 1 CAPSULE (0.25 MCG TOTAL)  BY MOUTH DAILY.     diltiazem 120 MG 24 hr capsule  Commonly known as:  CARDIZEM CD  TAKE 1 CAPSULE BY MOUTH DAILY     furosemide 40 MG tablet  Commonly known as:  LASIX  Take 1 tablet (40 mg total) by mouth 2 (two) times daily.     glimepiride 1 MG tablet  Commonly known as:  AMARYL  Take 0.5 mg by mouth daily with breakfast.     lisinopril 10 MG tablet  Commonly known as:  PRINIVIL,ZESTRIL  Take 1 tablet (10 mg total) by mouth daily.     metoprolol succinate 50 MG 24 hr tablet  Commonly known as:  TOPROL-XL   Take 1 tablet (50 mg total) by mouth 2 (two) times daily. Take with or immediately following a meal.     potassium chloride SA 20 MEQ tablet  Commonly known as:  K-DUR,KLOR-CON  Take 10 mEq by mouth daily.     rosuvastatin 10 MG tablet  Commonly known as:  CRESTOR  Take 10 mg by mouth daily.     warfarin 3 MG tablet  Commonly known as:  COUMADIN  Take 1-2 tablets (3-6 mg total) by mouth daily. Take 3 mg on Monday, Wednesday, Friday, then take 6 mg all the other days            Follow-up Information    Follow up with [facility].   Why:  HH-RN/aide arranged- they will call you to arrange home visits   Contact information:   [address] [place] [address] [phone]       Follow up with [facility], Landry Mellow, MD.   Specialty:  Internal Medicine   Contact information:   [address]  [address] [place] [address] [phone]       Call Debbora Presto, MD.   Specialty:  Internal Medicine   Why:  As needed   Contact information:   [address] [place] [address] [phone]       Follow up with [facility], Landry Mellow, MD.   Specialty:  Internal Medicine   Contact information:   [address]  [address] [place] [address] [phone]        The results of significant diagnostics from this hospitalization (including imaging, microbiology, ancillary and laboratory) are listed below for reference.     Microbiology: No results found for this or any previous visit (from the past 240 hour(s)).   Labs: Basic Metabolic Panel:  Recent Labs Lab 11/11/15 2139 11/12/15 0240 11/13/15 0248 11/14/15 0827 11/15/15 0303  NA 142 144 144 144 140  K 3.5 3.3* 3.7 3.7 3.4*  CL 106 108 110 107 105  CO2 24 25 25 28 25   GLUCOSE 237* 161* 136* 147* 137*  BUN 20 20 21* 20 21*  CREATININE 1.45* 1.30* 1.38* 1.30* 1.17*  CALCIUM 10.6* 10.1 10.1 9.9 10.0   Liver Function Tests:  Recent  Labs Lab 11/11/15 2139  AST 25  ALT 22  ALKPHOS 88  BILITOT 0.9  PROT 7.5  ALBUMIN 3.8   CBC:  Recent Labs Lab 11/11/15 2139 11/12/15 0240 11/13/15 0248 11/14/15 0827 11/15/15 0303  WBC 6.8 5.9 4.7 4.6 4.4  NEUTROABS 4.1  --   --   --   --   HGB 13.1 12.5 12.0 12.5 12.3  HCT 42.1 40.1 38.9 41.4 39.6  MCV 93.6 93.7 94.9 97.0 96.4  PLT 224 202 178 173  169   BNP: BNP (last 3 results)  Recent Labs  11/11/15 2228 11/12/15 0240  BNP 1088.6* 970.6*   CBG:  Recent Labs Lab 11/14/15 0628 11/14/15 1143 11/14/15 1644 11/14/15 2026 11/15/15 0606  GLUCAP 121* 197* 114* 157* 121*   SIGNED: Time coordinating discharge: 30 minutes  MAGICK-Rakesha Dalporto, MD  [facility] 11/15/2015, 8:59 AM Pager [phone]  If 7PM-7AM, please contact night-coverage [email or web address] Password [email or web address]

## 2015-11-17 DIAGNOSIS — Z950 Presence of cardiac pacemaker: Secondary | ICD-10-CM | POA: Diagnosis not present

## 2015-11-17 DIAGNOSIS — N183 Chronic kidney disease, stage 3 (moderate): Secondary | ICD-10-CM | POA: Diagnosis not present

## 2015-11-17 DIAGNOSIS — N189 Chronic kidney disease, unspecified: Secondary | ICD-10-CM | POA: Diagnosis not present

## 2015-11-17 DIAGNOSIS — I129 Hypertensive chronic kidney disease with stage 1 through stage 4 chronic kidney disease, or unspecified chronic kidney disease: Secondary | ICD-10-CM | POA: Diagnosis not present

## 2015-11-17 DIAGNOSIS — E1122 Type 2 diabetes mellitus with diabetic chronic kidney disease: Secondary | ICD-10-CM | POA: Diagnosis not present

## 2015-11-17 DIAGNOSIS — Z7984 Long term (current) use of oral hypoglycemic drugs: Secondary | ICD-10-CM | POA: Diagnosis not present

## 2015-11-17 DIAGNOSIS — I482 Chronic atrial fibrillation: Secondary | ICD-10-CM | POA: Diagnosis not present

## 2015-11-17 DIAGNOSIS — Z7901 Long term (current) use of anticoagulants: Secondary | ICD-10-CM | POA: Diagnosis not present

## 2015-11-17 DIAGNOSIS — E785 Hyperlipidemia, unspecified: Secondary | ICD-10-CM | POA: Diagnosis not present

## 2015-11-17 DIAGNOSIS — M150 Primary generalized (osteo)arthritis: Secondary | ICD-10-CM | POA: Diagnosis not present

## 2015-11-17 DIAGNOSIS — I5023 Acute on chronic systolic (congestive) heart failure: Secondary | ICD-10-CM | POA: Diagnosis not present

## 2015-11-17 DIAGNOSIS — I509 Heart failure, unspecified: Secondary | ICD-10-CM | POA: Diagnosis not present

## 2015-11-17 DIAGNOSIS — I429 Cardiomyopathy, unspecified: Secondary | ICD-10-CM | POA: Diagnosis not present

## 2015-11-17 DIAGNOSIS — I4891 Unspecified atrial fibrillation: Secondary | ICD-10-CM | POA: Diagnosis not present

## 2015-11-17 DIAGNOSIS — I10 Essential (primary) hypertension: Secondary | ICD-10-CM | POA: Diagnosis not present

## 2015-11-18 NOTE — Progress Notes (Signed)
Cardiology Office Note    Date:  11/19/2015   ID:  [name], DOB 06/19/1927, MRN [id number]  PCP:  Laura Rainwater, MD  Cardiologist:  Dr. Graciela Husbands   Post hospital follow up  History of Present Illness:  [name] is a 82 y.o. female with a history of NICM (EF 45-50%), DM, HLD, aortic stenosis, CKD, permanent atrial fibrillation, tachy/brady s/p PPM who presents to clinic for post hospital follow up.   She was admitted 4/26-4/28/17 for acute on chronic systolic CHF. Echo 09/29/2014 demonstrated EF 45-50%, moderate AS, LA severely dilated, PA pressure 52. She was diuresed and continued with rate control of afib. Discharge weight 152 lbs. Digoxin was discontinued and she was discharged on 40mg  Lasix BID.   Today she presents to clinic for follow up. No CP. No SOB, LE edema, orthopnea or PND. No dizziness or syncope. No blood in stool or urine. Overall, feeling a lot better then when she went into the hospital. Weighing herself daily and no salt in her diet.   Past Medical History  Diagnosis Date  . Cardiomyopathy (HCC)     LVEF 40-45% August 2015  . Type 2 diabetes mellitus (HCC)   . Dyslipidemia   . Thyroid goiter   . Atrial fibrillation (HCC)     Coumadin  . Osteoarthritis   . Essential hypertension   . Aortic stenosis     Moderate to severe August 2015   . Incontinent of urine     Past Surgical History  Procedure Laterality Date  . Pacemaker insertion      St Jude CRT-P ([place])  . Cardiac catheterization    . Abdominal hysterectomy      Current Medications: Outpatient Prescriptions Prior to Visit  Medication Sig Dispense Refill  . calcitRIOL (ROCALTROL) 0.25 MCG capsule TAKE 1 CAPSULE (0.25 MCG TOTAL) BY MOUTH DAILY. 30 capsule 6  . diltiazem (CARDIZEM CD) 120 MG 24 hr capsule TAKE 1 CAPSULE BY MOUTH DAILY 30 capsule 1  . furosemide (LASIX) 40 MG tablet Take 1 tablet (40 mg total) by mouth 2 (two) times daily. 60 tablet 0  . glimepiride  (AMARYL) 1 MG tablet Take 0.5 mg by mouth daily with breakfast.    . lisinopril (PRINIVIL,ZESTRIL) 10 MG tablet Take 1 tablet (10 mg total) by mouth daily. 30 tablet 0  . potassium chloride SA (K-DUR,KLOR-CON) 20 MEQ tablet Take 10 mEq by mouth daily.     . rosuvastatin (CRESTOR) 10 MG tablet Take 10 mg by mouth daily.    Marland Kitchen warfarin (COUMADIN) 3 MG tablet Take 1-2 tablets (3-6 mg total) by mouth daily. Take 3 mg on Monday, Wednesday, Friday, then take 6 mg all the other days (Patient taking differently: Take 3-6 mg by mouth daily. Take 6 mg on Monday, Wednesday, Thursday, then take 3 mg all the other days)    . metoprolol succinate (TOPROL-XL) 50 MG 24 hr tablet Take 1 tablet (50 mg total) by mouth 2 (two) times daily. Take with or immediately following a meal. 60 tablet 0   No facility-administered medications prior to visit.     Allergies:   Review of patient's allergies indicates no known allergies.   Social History   Social History  . Marital Status: Widowed    Spouse Name: N/A  . Number of Children: N/A  . Years of Education: N/A   Social History Main Topics  . Smoking status: Never Smoker   . Smokeless tobacco: Never Used  .  Alcohol Use: No  . Drug Use: No  . Sexual Activity: Not Asked   Other Topics Concern  . None   Social History Narrative     Family History:  The patient'sfamily history includes Hypertension in her brother.   ROS:   Please see the history of present illness.    ROS All other systems reviewed and are negative.   PHYSICAL EXAM:   VS:  BP 142/86 mmHg  Pulse 94  Ht  (1.626 m)  Wt 154 lb 12.8 oz (70.217 kg)  BMI 26.56 kg/m2   GEN: Well nourished, well developed, in no acute distress HEENT: normal Neck: no JVD, carotid bruits, or masses Cardiac: irreg irreg; +3/6 SEM , rubs, or gallops,no edema  Respiratory:  clear to auscultation bilaterally, normal work of breathing GI: soft, nontender, nondistended, + BS MS: no deformity or  atrophy Skin: warm and dry, no rash Neuro:  Alert and Oriented x 3, Strength and sensation are intact Psych: euthymic mood, full affect  Wt Readings from Last 3 Encounters:  11/19/15 154 lb 12.8 oz (70.217 kg)  11/15/15 152 lb (68.947 kg)  07/30/15 163 lb (73.936 kg)      Studies/Labs Reviewed:   EKG:  EKG is ordered today. This showed afib with HR 94  Recent Labs: 11/11/2015: ALT 22 11/12/2015: B Natriuretic Peptide 970.6*; TSH 2.251 11/15/2015: BUN 21*; Creatinine, Ser 1.17*; Hemoglobin 12.3; Platelets 169; Potassium 3.4*; Sodium 140   Lipid Panel No results found for: CHOL, TRIG, HDL, CHOLHDL, VLDL, LDLCALC, LDLDIRECT  Additional studies/ records that were reviewed today include:   2D ECHO: 09/29/2014 LV EF: 45% - 50% Study Conclusions - Left ventricle: The cavity size was normal. Wall thickness was increased in a pattern of moderate LVH. Systolic function was mildly reduced. The estimated ejection fraction was in the range of 45% to 50%. - Aortic valve: There was moderate stenosis. Valve area (VTI): 0.75 cm^2. Valve area (Vmax): 0.73 cm^2. Valve area (Vmean): 0.73 cm^2. - Mitral valve: There was mild regurgitation. - Left atrium: The atrium was severely dilated. - Right atrium: The atrium was mildly dilated. - Pulmonary arteries: Systolic pressure was moderately increased. PA peak pressure: 52 mm Hg (S).-    ASSESSMENT:    1. Permanent atrial fibrillation (HCC)   2. Chronic systolic CHF (congestive heart failure) (HCC)   3. Essential hypertension   4. Aortic stenosis   5. CKD (chronic kidney disease), unspecified stage   6. Tachycardia-bradycardia (HCC)      PLAN:  In order of problems listed above:  Permanent atrial fibrillation -- Continue Warfarin for CHADS2VASC of 6 -- HR 94 and BP 142/86. Continue Cardizem CD  daily.  I will increase Toprol XL  daily to  daily.   Chronic systolic heart failure: NICM (EF 45-50%) -- Appears  euvolemic. Continue lasix  BID.  -- Continue Toprol XL and lisinopril  daily.   HTN: stable on current regimen.   Aortic stenosis: moderate by most recent 2D ECHO.   CKD: creat stable with diuresis. Check BMET today.   Tachy/brady s/p PPM: continue follow up with Dr. Graciela Husbands  Medication Adjustments/Labs and Tests Ordered: Current medicines are reviewed at length with the patient today.  Concerns regarding medicines are outlined above.  Medication changes, Labs and Tests ordered today are listed in the Patient Instructions below. Patient Instructions  Medication Instructions:  Your physician recommends that you continue on your current medications as directed. Please refer to the Current Medication list given to  you today.   Labwork: None ordered  Testing/Procedures: None ordered  Follow-Up: Your physician recommends that you schedule a follow-up appointment in:    Any Other Special Instructions Will Be Listed Below (If Applicable).     If you need a refill on your cardiac medications before your next appointment, please call your pharmacy.       Charlestine Massed  11/19/2015 2:24 PM    [facility] [address] [place], [place], [place]  [address] Phone: [phone]; Fax: [phone]

## 2015-11-19 ENCOUNTER — Encounter: Payer: Self-pay | Admitting: Physician Assistant

## 2015-11-19 ENCOUNTER — Ambulatory Visit (INDEPENDENT_AMBULATORY_CARE_PROVIDER_SITE_OTHER): Payer: Medicare Other | Admitting: Physician Assistant

## 2015-11-19 VITALS — BP 142/86 | HR 94 | Ht 64.0 in | Wt 154.8 lb

## 2015-11-19 DIAGNOSIS — I129 Hypertensive chronic kidney disease with stage 1 through stage 4 chronic kidney disease, or unspecified chronic kidney disease: Secondary | ICD-10-CM | POA: Diagnosis not present

## 2015-11-19 DIAGNOSIS — I10 Essential (primary) hypertension: Secondary | ICD-10-CM | POA: Diagnosis not present

## 2015-11-19 DIAGNOSIS — N189 Chronic kidney disease, unspecified: Secondary | ICD-10-CM | POA: Diagnosis not present

## 2015-11-19 DIAGNOSIS — I482 Chronic atrial fibrillation: Secondary | ICD-10-CM | POA: Diagnosis not present

## 2015-11-19 DIAGNOSIS — I509 Heart failure, unspecified: Secondary | ICD-10-CM | POA: Diagnosis not present

## 2015-11-19 DIAGNOSIS — I429 Cardiomyopathy, unspecified: Secondary | ICD-10-CM | POA: Diagnosis not present

## 2015-11-19 DIAGNOSIS — I5023 Acute on chronic systolic (congestive) heart failure: Secondary | ICD-10-CM | POA: Diagnosis not present

## 2015-11-19 DIAGNOSIS — I4891 Unspecified atrial fibrillation: Secondary | ICD-10-CM | POA: Diagnosis not present

## 2015-11-19 DIAGNOSIS — I5022 Chronic systolic (congestive) heart failure: Secondary | ICD-10-CM | POA: Diagnosis not present

## 2015-11-19 DIAGNOSIS — I495 Sick sinus syndrome: Secondary | ICD-10-CM

## 2015-11-19 DIAGNOSIS — E1122 Type 2 diabetes mellitus with diabetic chronic kidney disease: Secondary | ICD-10-CM | POA: Diagnosis not present

## 2015-11-19 DIAGNOSIS — I350 Nonrheumatic aortic (valve) stenosis: Secondary | ICD-10-CM | POA: Diagnosis not present

## 2015-11-19 DIAGNOSIS — I4821 Permanent atrial fibrillation: Secondary | ICD-10-CM

## 2015-11-19 MED ORDER — METOPROLOL SUCCINATE ER 25 MG PO TB24: 75.0000 mg | ORAL_TABLET | Freq: Every day | ORAL | Status: DC

## 2015-11-19 MED ORDER — METOPROLOL TARTRATE 75 MG PO TABS: 75.0000 mg | ORAL_TABLET | Freq: Every day | ORAL | Status: DC

## 2015-11-19 MED ORDER — METOPROLOL SUCCINATE ER 50 MG PO TB24: 75.0000 mg | ORAL_TABLET | Freq: Every day | ORAL | Status: DC

## 2015-11-19 MED ORDER — METOPROLOL TARTRATE 50 MG PO TABS: 75.0000 mg | ORAL_TABLET | Freq: Every day | ORAL | Status: DC

## 2015-11-19 NOTE — Patient Instructions (Addendum)
Medication Instructions:  Your physician has recommended you make the following change in your medication:  1.  INCREASE the Toprol XL to 25 mg taking 3 tablets daily  Labwork: TODAY;  BMET  Testing/Procedures: None ordered  Follow-Up: Your physician recommends that you schedule a follow-up appointment in: 3 MONTHS WITH DR. Graciela Husbands   Any Other Special Instructions Will Be Listed Below (If Applicable).     If you need a refill on your cardiac medications before your next appointment, please call your pharmacy.

## 2015-11-20 DIAGNOSIS — I5023 Acute on chronic systolic (congestive) heart failure: Secondary | ICD-10-CM | POA: Diagnosis not present

## 2015-11-20 DIAGNOSIS — E1122 Type 2 diabetes mellitus with diabetic chronic kidney disease: Secondary | ICD-10-CM | POA: Diagnosis not present

## 2015-11-20 DIAGNOSIS — I129 Hypertensive chronic kidney disease with stage 1 through stage 4 chronic kidney disease, or unspecified chronic kidney disease: Secondary | ICD-10-CM | POA: Diagnosis not present

## 2015-11-20 DIAGNOSIS — I4891 Unspecified atrial fibrillation: Secondary | ICD-10-CM | POA: Diagnosis not present

## 2015-11-20 DIAGNOSIS — N189 Chronic kidney disease, unspecified: Secondary | ICD-10-CM | POA: Diagnosis not present

## 2015-11-20 DIAGNOSIS — I429 Cardiomyopathy, unspecified: Secondary | ICD-10-CM | POA: Diagnosis not present

## 2015-11-20 LAB — BASIC METABOLIC PANEL
BUN: 27 mg/dL — AB (ref 7–25)
CALCIUM: 10.8 mg/dL — AB (ref 8.6–10.4)
CO2: 21 mmol/L (ref 20–31)
Chloride: 101 mmol/L (ref 98–110)
Creat: 1.33 mg/dL — ABNORMAL HIGH (ref 0.60–0.88)
GLUCOSE: 166 mg/dL — AB (ref 65–99)
Potassium: 4.9 mmol/L (ref 3.5–5.3)
Sodium: 140 mmol/L (ref 135–146)

## 2015-11-23 DIAGNOSIS — I4891 Unspecified atrial fibrillation: Secondary | ICD-10-CM | POA: Diagnosis not present

## 2015-11-23 DIAGNOSIS — I429 Cardiomyopathy, unspecified: Secondary | ICD-10-CM | POA: Diagnosis not present

## 2015-11-23 DIAGNOSIS — N189 Chronic kidney disease, unspecified: Secondary | ICD-10-CM | POA: Diagnosis not present

## 2015-11-23 DIAGNOSIS — I129 Hypertensive chronic kidney disease with stage 1 through stage 4 chronic kidney disease, or unspecified chronic kidney disease: Secondary | ICD-10-CM | POA: Diagnosis not present

## 2015-11-23 DIAGNOSIS — E1122 Type 2 diabetes mellitus with diabetic chronic kidney disease: Secondary | ICD-10-CM | POA: Diagnosis not present

## 2015-11-23 DIAGNOSIS — I5023 Acute on chronic systolic (congestive) heart failure: Secondary | ICD-10-CM | POA: Diagnosis not present

## 2015-11-24 DIAGNOSIS — N189 Chronic kidney disease, unspecified: Secondary | ICD-10-CM | POA: Diagnosis not present

## 2015-11-24 DIAGNOSIS — I4891 Unspecified atrial fibrillation: Secondary | ICD-10-CM | POA: Diagnosis not present

## 2015-11-24 DIAGNOSIS — I129 Hypertensive chronic kidney disease with stage 1 through stage 4 chronic kidney disease, or unspecified chronic kidney disease: Secondary | ICD-10-CM | POA: Diagnosis not present

## 2015-11-24 DIAGNOSIS — E1122 Type 2 diabetes mellitus with diabetic chronic kidney disease: Secondary | ICD-10-CM | POA: Diagnosis not present

## 2015-11-24 DIAGNOSIS — I429 Cardiomyopathy, unspecified: Secondary | ICD-10-CM | POA: Diagnosis not present

## 2015-11-24 DIAGNOSIS — I5023 Acute on chronic systolic (congestive) heart failure: Secondary | ICD-10-CM | POA: Diagnosis not present

## 2015-11-25 DIAGNOSIS — I4891 Unspecified atrial fibrillation: Secondary | ICD-10-CM | POA: Diagnosis not present

## 2015-11-25 DIAGNOSIS — I429 Cardiomyopathy, unspecified: Secondary | ICD-10-CM | POA: Diagnosis not present

## 2015-11-25 DIAGNOSIS — I5023 Acute on chronic systolic (congestive) heart failure: Secondary | ICD-10-CM | POA: Diagnosis not present

## 2015-11-25 DIAGNOSIS — I129 Hypertensive chronic kidney disease with stage 1 through stage 4 chronic kidney disease, or unspecified chronic kidney disease: Secondary | ICD-10-CM | POA: Diagnosis not present

## 2015-11-25 DIAGNOSIS — N189 Chronic kidney disease, unspecified: Secondary | ICD-10-CM | POA: Diagnosis not present

## 2015-11-25 DIAGNOSIS — E1122 Type 2 diabetes mellitus with diabetic chronic kidney disease: Secondary | ICD-10-CM | POA: Diagnosis not present

## 2015-11-26 DIAGNOSIS — N189 Chronic kidney disease, unspecified: Secondary | ICD-10-CM | POA: Diagnosis not present

## 2015-11-26 DIAGNOSIS — E1122 Type 2 diabetes mellitus with diabetic chronic kidney disease: Secondary | ICD-10-CM | POA: Diagnosis not present

## 2015-11-26 DIAGNOSIS — I429 Cardiomyopathy, unspecified: Secondary | ICD-10-CM | POA: Diagnosis not present

## 2015-11-26 DIAGNOSIS — I129 Hypertensive chronic kidney disease with stage 1 through stage 4 chronic kidney disease, or unspecified chronic kidney disease: Secondary | ICD-10-CM | POA: Diagnosis not present

## 2015-11-26 DIAGNOSIS — I4891 Unspecified atrial fibrillation: Secondary | ICD-10-CM | POA: Diagnosis not present

## 2015-11-26 DIAGNOSIS — I5023 Acute on chronic systolic (congestive) heart failure: Secondary | ICD-10-CM | POA: Diagnosis not present

## 2015-11-27 DIAGNOSIS — I429 Cardiomyopathy, unspecified: Secondary | ICD-10-CM | POA: Diagnosis not present

## 2015-11-27 DIAGNOSIS — I4891 Unspecified atrial fibrillation: Secondary | ICD-10-CM | POA: Diagnosis not present

## 2015-11-27 DIAGNOSIS — I5023 Acute on chronic systolic (congestive) heart failure: Secondary | ICD-10-CM | POA: Diagnosis not present

## 2015-11-27 DIAGNOSIS — E1122 Type 2 diabetes mellitus with diabetic chronic kidney disease: Secondary | ICD-10-CM | POA: Diagnosis not present

## 2015-11-27 DIAGNOSIS — I129 Hypertensive chronic kidney disease with stage 1 through stage 4 chronic kidney disease, or unspecified chronic kidney disease: Secondary | ICD-10-CM | POA: Diagnosis not present

## 2015-11-27 DIAGNOSIS — N189 Chronic kidney disease, unspecified: Secondary | ICD-10-CM | POA: Diagnosis not present

## 2015-11-30 DIAGNOSIS — I129 Hypertensive chronic kidney disease with stage 1 through stage 4 chronic kidney disease, or unspecified chronic kidney disease: Secondary | ICD-10-CM | POA: Diagnosis not present

## 2015-11-30 DIAGNOSIS — N189 Chronic kidney disease, unspecified: Secondary | ICD-10-CM | POA: Diagnosis not present

## 2015-11-30 DIAGNOSIS — I5023 Acute on chronic systolic (congestive) heart failure: Secondary | ICD-10-CM | POA: Diagnosis not present

## 2015-11-30 DIAGNOSIS — I4891 Unspecified atrial fibrillation: Secondary | ICD-10-CM | POA: Diagnosis not present

## 2015-11-30 DIAGNOSIS — I429 Cardiomyopathy, unspecified: Secondary | ICD-10-CM | POA: Diagnosis not present

## 2015-11-30 DIAGNOSIS — E1122 Type 2 diabetes mellitus with diabetic chronic kidney disease: Secondary | ICD-10-CM | POA: Diagnosis not present

## 2015-12-01 ENCOUNTER — Telehealth: Payer: Self-pay | Admitting: Internal Medicine

## 2015-12-01 ENCOUNTER — Encounter: Payer: Self-pay | Admitting: *Deleted

## 2015-12-01 ENCOUNTER — Telehealth: Payer: Self-pay | Admitting: *Deleted

## 2015-12-01 NOTE — Telephone Encounter (Signed)
New Message:  Pt's daughter  is calling back to get the results to lab results . Please f/u with her

## 2015-12-01 NOTE — Telephone Encounter (Signed)
error 

## 2015-12-01 NOTE — Telephone Encounter (Signed)
Pt does not have a DPR on file, therefore, cannot speak with the daughter re: lab results.

## 2015-12-02 DIAGNOSIS — N189 Chronic kidney disease, unspecified: Secondary | ICD-10-CM | POA: Diagnosis not present

## 2015-12-02 DIAGNOSIS — I429 Cardiomyopathy, unspecified: Secondary | ICD-10-CM | POA: Diagnosis not present

## 2015-12-02 DIAGNOSIS — E1122 Type 2 diabetes mellitus with diabetic chronic kidney disease: Secondary | ICD-10-CM | POA: Diagnosis not present

## 2015-12-02 DIAGNOSIS — I129 Hypertensive chronic kidney disease with stage 1 through stage 4 chronic kidney disease, or unspecified chronic kidney disease: Secondary | ICD-10-CM | POA: Diagnosis not present

## 2015-12-02 DIAGNOSIS — I5023 Acute on chronic systolic (congestive) heart failure: Secondary | ICD-10-CM | POA: Diagnosis not present

## 2015-12-02 DIAGNOSIS — I4891 Unspecified atrial fibrillation: Secondary | ICD-10-CM | POA: Diagnosis not present

## 2015-12-03 DIAGNOSIS — E1122 Type 2 diabetes mellitus with diabetic chronic kidney disease: Secondary | ICD-10-CM | POA: Diagnosis not present

## 2015-12-03 DIAGNOSIS — N189 Chronic kidney disease, unspecified: Secondary | ICD-10-CM | POA: Diagnosis not present

## 2015-12-03 DIAGNOSIS — I129 Hypertensive chronic kidney disease with stage 1 through stage 4 chronic kidney disease, or unspecified chronic kidney disease: Secondary | ICD-10-CM | POA: Diagnosis not present

## 2015-12-03 DIAGNOSIS — I429 Cardiomyopathy, unspecified: Secondary | ICD-10-CM | POA: Diagnosis not present

## 2015-12-03 DIAGNOSIS — I5023 Acute on chronic systolic (congestive) heart failure: Secondary | ICD-10-CM | POA: Diagnosis not present

## 2015-12-03 DIAGNOSIS — I4891 Unspecified atrial fibrillation: Secondary | ICD-10-CM | POA: Diagnosis not present

## 2015-12-04 NOTE — Telephone Encounter (Signed)
I spoke with the pt and pt's daughter in regards to lab results (11/19/15 BMP). I instructed the pt and daughter that a DPR needs to be signed for our office. They will do this at an upcoming appointment.

## 2015-12-07 DIAGNOSIS — I4891 Unspecified atrial fibrillation: Secondary | ICD-10-CM | POA: Diagnosis not present

## 2015-12-07 DIAGNOSIS — I129 Hypertensive chronic kidney disease with stage 1 through stage 4 chronic kidney disease, or unspecified chronic kidney disease: Secondary | ICD-10-CM | POA: Diagnosis not present

## 2015-12-07 DIAGNOSIS — E1122 Type 2 diabetes mellitus with diabetic chronic kidney disease: Secondary | ICD-10-CM | POA: Diagnosis not present

## 2015-12-07 DIAGNOSIS — N189 Chronic kidney disease, unspecified: Secondary | ICD-10-CM | POA: Diagnosis not present

## 2015-12-07 DIAGNOSIS — I5023 Acute on chronic systolic (congestive) heart failure: Secondary | ICD-10-CM | POA: Diagnosis not present

## 2015-12-07 DIAGNOSIS — I429 Cardiomyopathy, unspecified: Secondary | ICD-10-CM | POA: Diagnosis not present

## 2015-12-08 ENCOUNTER — Encounter: Payer: Self-pay | Admitting: Cardiology

## 2015-12-08 DIAGNOSIS — E1122 Type 2 diabetes mellitus with diabetic chronic kidney disease: Secondary | ICD-10-CM | POA: Diagnosis not present

## 2015-12-08 DIAGNOSIS — I4891 Unspecified atrial fibrillation: Secondary | ICD-10-CM | POA: Diagnosis not present

## 2015-12-08 DIAGNOSIS — I5023 Acute on chronic systolic (congestive) heart failure: Secondary | ICD-10-CM | POA: Diagnosis not present

## 2015-12-08 DIAGNOSIS — I429 Cardiomyopathy, unspecified: Secondary | ICD-10-CM | POA: Diagnosis not present

## 2015-12-08 DIAGNOSIS — N189 Chronic kidney disease, unspecified: Secondary | ICD-10-CM | POA: Diagnosis not present

## 2015-12-08 DIAGNOSIS — I129 Hypertensive chronic kidney disease with stage 1 through stage 4 chronic kidney disease, or unspecified chronic kidney disease: Secondary | ICD-10-CM | POA: Diagnosis not present

## 2015-12-08 LAB — CUP PACEART REMOTE DEVICE CHECK
Battery Remaining Longevity: 129 mo
Battery Remaining Percentage: 95.5 %
Battery Voltage: 3.02 V
Date Time Interrogation Session: 20170413060012
Implantable Lead Implant Date: 20150721
Implantable Lead Location: 753858
Lead Channel Impedance Value: 490 Ohm
Lead Channel Sensing Intrinsic Amplitude: 12 mV
Lead Channel Setting Pacing Pulse Width: 0.5 ms
MDC IDC LEAD IMPLANT DT: 20150721
MDC IDC LEAD LOCATION: 753860
MDC IDC PG SERIAL: 7594455
MDC IDC SET LEADCHNL RV PACING AMPLITUDE: 2.5 V
MDC IDC SET LEADCHNL RV SENSING SENSITIVITY: 2 mV
MDC IDC STAT BRADY RV PERCENT PACED: 14 %
Pulse Gen Model: 3242

## 2015-12-09 DIAGNOSIS — I4891 Unspecified atrial fibrillation: Secondary | ICD-10-CM | POA: Diagnosis not present

## 2015-12-09 DIAGNOSIS — I429 Cardiomyopathy, unspecified: Secondary | ICD-10-CM | POA: Diagnosis not present

## 2015-12-09 DIAGNOSIS — I5023 Acute on chronic systolic (congestive) heart failure: Secondary | ICD-10-CM | POA: Diagnosis not present

## 2015-12-09 DIAGNOSIS — I129 Hypertensive chronic kidney disease with stage 1 through stage 4 chronic kidney disease, or unspecified chronic kidney disease: Secondary | ICD-10-CM | POA: Diagnosis not present

## 2015-12-09 DIAGNOSIS — N189 Chronic kidney disease, unspecified: Secondary | ICD-10-CM | POA: Diagnosis not present

## 2015-12-09 DIAGNOSIS — E1122 Type 2 diabetes mellitus with diabetic chronic kidney disease: Secondary | ICD-10-CM | POA: Diagnosis not present

## 2015-12-10 DIAGNOSIS — E1122 Type 2 diabetes mellitus with diabetic chronic kidney disease: Secondary | ICD-10-CM | POA: Diagnosis not present

## 2015-12-10 DIAGNOSIS — I129 Hypertensive chronic kidney disease with stage 1 through stage 4 chronic kidney disease, or unspecified chronic kidney disease: Secondary | ICD-10-CM | POA: Diagnosis not present

## 2015-12-10 DIAGNOSIS — I4891 Unspecified atrial fibrillation: Secondary | ICD-10-CM | POA: Diagnosis not present

## 2015-12-10 DIAGNOSIS — I5023 Acute on chronic systolic (congestive) heart failure: Secondary | ICD-10-CM | POA: Diagnosis not present

## 2015-12-10 DIAGNOSIS — N189 Chronic kidney disease, unspecified: Secondary | ICD-10-CM | POA: Diagnosis not present

## 2015-12-10 DIAGNOSIS — I429 Cardiomyopathy, unspecified: Secondary | ICD-10-CM | POA: Diagnosis not present

## 2015-12-14 DIAGNOSIS — I429 Cardiomyopathy, unspecified: Secondary | ICD-10-CM | POA: Diagnosis not present

## 2015-12-14 DIAGNOSIS — I4891 Unspecified atrial fibrillation: Secondary | ICD-10-CM | POA: Diagnosis not present

## 2015-12-14 DIAGNOSIS — I5023 Acute on chronic systolic (congestive) heart failure: Secondary | ICD-10-CM | POA: Diagnosis not present

## 2015-12-14 DIAGNOSIS — I129 Hypertensive chronic kidney disease with stage 1 through stage 4 chronic kidney disease, or unspecified chronic kidney disease: Secondary | ICD-10-CM | POA: Diagnosis not present

## 2015-12-14 DIAGNOSIS — N189 Chronic kidney disease, unspecified: Secondary | ICD-10-CM | POA: Diagnosis not present

## 2015-12-14 DIAGNOSIS — E1122 Type 2 diabetes mellitus with diabetic chronic kidney disease: Secondary | ICD-10-CM | POA: Diagnosis not present

## 2015-12-15 DIAGNOSIS — I129 Hypertensive chronic kidney disease with stage 1 through stage 4 chronic kidney disease, or unspecified chronic kidney disease: Secondary | ICD-10-CM | POA: Diagnosis not present

## 2015-12-15 DIAGNOSIS — I4891 Unspecified atrial fibrillation: Secondary | ICD-10-CM | POA: Diagnosis not present

## 2015-12-15 DIAGNOSIS — I429 Cardiomyopathy, unspecified: Secondary | ICD-10-CM | POA: Diagnosis not present

## 2015-12-15 DIAGNOSIS — N189 Chronic kidney disease, unspecified: Secondary | ICD-10-CM | POA: Diagnosis not present

## 2015-12-15 DIAGNOSIS — I5023 Acute on chronic systolic (congestive) heart failure: Secondary | ICD-10-CM | POA: Diagnosis not present

## 2015-12-15 DIAGNOSIS — E1122 Type 2 diabetes mellitus with diabetic chronic kidney disease: Secondary | ICD-10-CM | POA: Diagnosis not present

## 2015-12-16 DIAGNOSIS — I4891 Unspecified atrial fibrillation: Secondary | ICD-10-CM | POA: Diagnosis not present

## 2015-12-16 DIAGNOSIS — I5023 Acute on chronic systolic (congestive) heart failure: Secondary | ICD-10-CM | POA: Diagnosis not present

## 2015-12-16 DIAGNOSIS — I429 Cardiomyopathy, unspecified: Secondary | ICD-10-CM | POA: Diagnosis not present

## 2015-12-16 DIAGNOSIS — N189 Chronic kidney disease, unspecified: Secondary | ICD-10-CM | POA: Diagnosis not present

## 2015-12-16 DIAGNOSIS — E1122 Type 2 diabetes mellitus with diabetic chronic kidney disease: Secondary | ICD-10-CM | POA: Diagnosis not present

## 2015-12-16 DIAGNOSIS — I129 Hypertensive chronic kidney disease with stage 1 through stage 4 chronic kidney disease, or unspecified chronic kidney disease: Secondary | ICD-10-CM | POA: Diagnosis not present

## 2015-12-17 DIAGNOSIS — I429 Cardiomyopathy, unspecified: Secondary | ICD-10-CM | POA: Diagnosis not present

## 2015-12-17 DIAGNOSIS — I5023 Acute on chronic systolic (congestive) heart failure: Secondary | ICD-10-CM | POA: Diagnosis not present

## 2015-12-17 DIAGNOSIS — I129 Hypertensive chronic kidney disease with stage 1 through stage 4 chronic kidney disease, or unspecified chronic kidney disease: Secondary | ICD-10-CM | POA: Diagnosis not present

## 2015-12-17 DIAGNOSIS — E1122 Type 2 diabetes mellitus with diabetic chronic kidney disease: Secondary | ICD-10-CM | POA: Diagnosis not present

## 2015-12-17 DIAGNOSIS — N189 Chronic kidney disease, unspecified: Secondary | ICD-10-CM | POA: Diagnosis not present

## 2015-12-17 DIAGNOSIS — I4891 Unspecified atrial fibrillation: Secondary | ICD-10-CM | POA: Diagnosis not present

## 2015-12-22 DIAGNOSIS — I5023 Acute on chronic systolic (congestive) heart failure: Secondary | ICD-10-CM | POA: Diagnosis not present

## 2015-12-22 DIAGNOSIS — E1122 Type 2 diabetes mellitus with diabetic chronic kidney disease: Secondary | ICD-10-CM | POA: Diagnosis not present

## 2015-12-22 DIAGNOSIS — I4891 Unspecified atrial fibrillation: Secondary | ICD-10-CM | POA: Diagnosis not present

## 2015-12-22 DIAGNOSIS — N189 Chronic kidney disease, unspecified: Secondary | ICD-10-CM | POA: Diagnosis not present

## 2015-12-22 DIAGNOSIS — I429 Cardiomyopathy, unspecified: Secondary | ICD-10-CM | POA: Diagnosis not present

## 2015-12-22 DIAGNOSIS — I129 Hypertensive chronic kidney disease with stage 1 through stage 4 chronic kidney disease, or unspecified chronic kidney disease: Secondary | ICD-10-CM | POA: Diagnosis not present

## 2015-12-26 ENCOUNTER — Other Ambulatory Visit: Payer: Self-pay | Admitting: Internal Medicine

## 2015-12-28 ENCOUNTER — Other Ambulatory Visit: Payer: Self-pay | Admitting: Internal Medicine

## 2015-12-28 NOTE — Telephone Encounter (Signed)
This should be filled through her PCP.  Thanks!

## 2015-12-29 DIAGNOSIS — I429 Cardiomyopathy, unspecified: Secondary | ICD-10-CM | POA: Diagnosis not present

## 2015-12-29 DIAGNOSIS — I129 Hypertensive chronic kidney disease with stage 1 through stage 4 chronic kidney disease, or unspecified chronic kidney disease: Secondary | ICD-10-CM | POA: Diagnosis not present

## 2015-12-29 DIAGNOSIS — I5023 Acute on chronic systolic (congestive) heart failure: Secondary | ICD-10-CM | POA: Diagnosis not present

## 2015-12-29 DIAGNOSIS — N189 Chronic kidney disease, unspecified: Secondary | ICD-10-CM | POA: Diagnosis not present

## 2015-12-29 DIAGNOSIS — I4891 Unspecified atrial fibrillation: Secondary | ICD-10-CM | POA: Diagnosis not present

## 2015-12-29 DIAGNOSIS — E1122 Type 2 diabetes mellitus with diabetic chronic kidney disease: Secondary | ICD-10-CM | POA: Diagnosis not present

## 2016-01-01 ENCOUNTER — Ambulatory Visit (INDEPENDENT_AMBULATORY_CARE_PROVIDER_SITE_OTHER): Payer: Medicare Other | Admitting: Podiatry

## 2016-01-01 ENCOUNTER — Encounter: Payer: Self-pay | Admitting: Podiatry

## 2016-01-01 DIAGNOSIS — B351 Tinea unguium: Secondary | ICD-10-CM

## 2016-01-01 DIAGNOSIS — M79673 Pain in unspecified foot: Secondary | ICD-10-CM

## 2016-01-01 NOTE — Progress Notes (Signed)
Patient ID: [name], female   DOB: 07-17-1927, 82 y.o.   MRN: [id number] HPI  Complaint:  Visit Type: Patient returns to my office for continued preventative foot care services. Complaint: Patient states" my nails have grown long and thick and become painful to walk and wear shoes" Patient has been diagnosed with DM with angiopathy. He presents for preventative foot care services. No changes to ROS  Podiatric Exam: Vascular: dorsalis pedis and posterior tibial pulses are negative. Capillary return is diminished. Temperature gradient is negative. Skin turgor WNL,  Sensorium: Normal Semmes Weinstein monofilament test. Normal tactile sensation bilaterally.  Nail Exam: Pt has thick disfigured discolored nails with subungual debris noted bilateral entire nail hallux through fifth toenails Ulcer Exam: There is no evidence of ulcer or pre-ulcerative changes or infection. Orthopedic Exam: Muscle tone and strength are WNL. No limitations in general ROM. No crepitus or effusions noted. Foot type and digits show no abnormalities. Bony prominences are unremarkable. Skin: No Porokeratosis. No infection or ulcers  Diagnosis:  Tinea unguium, Pain in right toe, pain in left toes  Treatment & Plan Procedures and Treatment: Consent by patient was obtained for treatment procedures. The patient understood the discussion of treatment and procedures well. All questions were answered thoroughly reviewed. Debridement of mycotic and hypertrophic toenails, 1 through 5 bilateral and clearing of subungual debris. No ulceration, no infection noted.  Return Visit-Office Procedure: Patient instructed to return to the office for a follow up visit 3 months for continued evaluation and treatment.

## 2016-01-05 DIAGNOSIS — N189 Chronic kidney disease, unspecified: Secondary | ICD-10-CM | POA: Diagnosis not present

## 2016-01-05 DIAGNOSIS — E1122 Type 2 diabetes mellitus with diabetic chronic kidney disease: Secondary | ICD-10-CM | POA: Diagnosis not present

## 2016-01-05 DIAGNOSIS — I4891 Unspecified atrial fibrillation: Secondary | ICD-10-CM | POA: Diagnosis not present

## 2016-01-05 DIAGNOSIS — I429 Cardiomyopathy, unspecified: Secondary | ICD-10-CM | POA: Diagnosis not present

## 2016-01-05 DIAGNOSIS — I5023 Acute on chronic systolic (congestive) heart failure: Secondary | ICD-10-CM | POA: Diagnosis not present

## 2016-01-05 DIAGNOSIS — I129 Hypertensive chronic kidney disease with stage 1 through stage 4 chronic kidney disease, or unspecified chronic kidney disease: Secondary | ICD-10-CM | POA: Diagnosis not present

## 2016-01-14 DIAGNOSIS — I429 Cardiomyopathy, unspecified: Secondary | ICD-10-CM | POA: Diagnosis not present

## 2016-01-14 DIAGNOSIS — I129 Hypertensive chronic kidney disease with stage 1 through stage 4 chronic kidney disease, or unspecified chronic kidney disease: Secondary | ICD-10-CM | POA: Diagnosis not present

## 2016-01-14 DIAGNOSIS — N189 Chronic kidney disease, unspecified: Secondary | ICD-10-CM | POA: Diagnosis not present

## 2016-01-14 DIAGNOSIS — I5023 Acute on chronic systolic (congestive) heart failure: Secondary | ICD-10-CM | POA: Diagnosis not present

## 2016-01-14 DIAGNOSIS — E1122 Type 2 diabetes mellitus with diabetic chronic kidney disease: Secondary | ICD-10-CM | POA: Diagnosis not present

## 2016-01-14 DIAGNOSIS — I4891 Unspecified atrial fibrillation: Secondary | ICD-10-CM | POA: Diagnosis not present

## 2016-01-16 DIAGNOSIS — E1122 Type 2 diabetes mellitus with diabetic chronic kidney disease: Secondary | ICD-10-CM | POA: Diagnosis not present

## 2016-01-16 DIAGNOSIS — I429 Cardiomyopathy, unspecified: Secondary | ICD-10-CM | POA: Diagnosis not present

## 2016-01-16 DIAGNOSIS — Z950 Presence of cardiac pacemaker: Secondary | ICD-10-CM | POA: Diagnosis not present

## 2016-01-16 DIAGNOSIS — M150 Primary generalized (osteo)arthritis: Secondary | ICD-10-CM | POA: Diagnosis not present

## 2016-01-16 DIAGNOSIS — Z7984 Long term (current) use of oral hypoglycemic drugs: Secondary | ICD-10-CM | POA: Diagnosis not present

## 2016-01-16 DIAGNOSIS — I5023 Acute on chronic systolic (congestive) heart failure: Secondary | ICD-10-CM | POA: Diagnosis not present

## 2016-01-16 DIAGNOSIS — N189 Chronic kidney disease, unspecified: Secondary | ICD-10-CM | POA: Diagnosis not present

## 2016-01-16 DIAGNOSIS — E785 Hyperlipidemia, unspecified: Secondary | ICD-10-CM | POA: Diagnosis not present

## 2016-01-16 DIAGNOSIS — I130 Hypertensive heart and chronic kidney disease with heart failure and stage 1 through stage 4 chronic kidney disease, or unspecified chronic kidney disease: Secondary | ICD-10-CM | POA: Diagnosis not present

## 2016-01-16 DIAGNOSIS — I4891 Unspecified atrial fibrillation: Secondary | ICD-10-CM | POA: Diagnosis not present

## 2016-01-28 DIAGNOSIS — I429 Cardiomyopathy, unspecified: Secondary | ICD-10-CM | POA: Diagnosis not present

## 2016-01-28 DIAGNOSIS — I4891 Unspecified atrial fibrillation: Secondary | ICD-10-CM | POA: Diagnosis not present

## 2016-01-28 DIAGNOSIS — I5023 Acute on chronic systolic (congestive) heart failure: Secondary | ICD-10-CM | POA: Diagnosis not present

## 2016-01-28 DIAGNOSIS — N189 Chronic kidney disease, unspecified: Secondary | ICD-10-CM | POA: Diagnosis not present

## 2016-01-28 DIAGNOSIS — E1122 Type 2 diabetes mellitus with diabetic chronic kidney disease: Secondary | ICD-10-CM | POA: Diagnosis not present

## 2016-01-28 DIAGNOSIS — I130 Hypertensive heart and chronic kidney disease with heart failure and stage 1 through stage 4 chronic kidney disease, or unspecified chronic kidney disease: Secondary | ICD-10-CM | POA: Diagnosis not present

## 2016-02-02 DIAGNOSIS — E1122 Type 2 diabetes mellitus with diabetic chronic kidney disease: Secondary | ICD-10-CM | POA: Diagnosis not present

## 2016-02-02 DIAGNOSIS — I130 Hypertensive heart and chronic kidney disease with heart failure and stage 1 through stage 4 chronic kidney disease, or unspecified chronic kidney disease: Secondary | ICD-10-CM | POA: Diagnosis not present

## 2016-02-02 DIAGNOSIS — I5023 Acute on chronic systolic (congestive) heart failure: Secondary | ICD-10-CM | POA: Diagnosis not present

## 2016-02-02 DIAGNOSIS — I429 Cardiomyopathy, unspecified: Secondary | ICD-10-CM | POA: Diagnosis not present

## 2016-02-02 DIAGNOSIS — I4891 Unspecified atrial fibrillation: Secondary | ICD-10-CM | POA: Diagnosis not present

## 2016-02-02 DIAGNOSIS — N189 Chronic kidney disease, unspecified: Secondary | ICD-10-CM | POA: Diagnosis not present

## 2016-02-09 DIAGNOSIS — I429 Cardiomyopathy, unspecified: Secondary | ICD-10-CM | POA: Diagnosis not present

## 2016-02-09 DIAGNOSIS — N189 Chronic kidney disease, unspecified: Secondary | ICD-10-CM | POA: Diagnosis not present

## 2016-02-09 DIAGNOSIS — E1122 Type 2 diabetes mellitus with diabetic chronic kidney disease: Secondary | ICD-10-CM | POA: Diagnosis not present

## 2016-02-09 DIAGNOSIS — I5023 Acute on chronic systolic (congestive) heart failure: Secondary | ICD-10-CM | POA: Diagnosis not present

## 2016-02-09 DIAGNOSIS — I130 Hypertensive heart and chronic kidney disease with heart failure and stage 1 through stage 4 chronic kidney disease, or unspecified chronic kidney disease: Secondary | ICD-10-CM | POA: Diagnosis not present

## 2016-02-09 DIAGNOSIS — I4891 Unspecified atrial fibrillation: Secondary | ICD-10-CM | POA: Diagnosis not present

## 2016-02-16 DIAGNOSIS — I4891 Unspecified atrial fibrillation: Secondary | ICD-10-CM | POA: Diagnosis not present

## 2016-02-16 DIAGNOSIS — E1122 Type 2 diabetes mellitus with diabetic chronic kidney disease: Secondary | ICD-10-CM | POA: Diagnosis not present

## 2016-02-16 DIAGNOSIS — N189 Chronic kidney disease, unspecified: Secondary | ICD-10-CM | POA: Diagnosis not present

## 2016-02-16 DIAGNOSIS — I5023 Acute on chronic systolic (congestive) heart failure: Secondary | ICD-10-CM | POA: Diagnosis not present

## 2016-02-16 DIAGNOSIS — I130 Hypertensive heart and chronic kidney disease with heart failure and stage 1 through stage 4 chronic kidney disease, or unspecified chronic kidney disease: Secondary | ICD-10-CM | POA: Diagnosis not present

## 2016-02-16 DIAGNOSIS — I429 Cardiomyopathy, unspecified: Secondary | ICD-10-CM | POA: Diagnosis not present

## 2016-02-23 DIAGNOSIS — I429 Cardiomyopathy, unspecified: Secondary | ICD-10-CM | POA: Diagnosis not present

## 2016-02-23 DIAGNOSIS — N189 Chronic kidney disease, unspecified: Secondary | ICD-10-CM | POA: Diagnosis not present

## 2016-02-23 DIAGNOSIS — I4891 Unspecified atrial fibrillation: Secondary | ICD-10-CM | POA: Diagnosis not present

## 2016-02-23 DIAGNOSIS — I130 Hypertensive heart and chronic kidney disease with heart failure and stage 1 through stage 4 chronic kidney disease, or unspecified chronic kidney disease: Secondary | ICD-10-CM | POA: Diagnosis not present

## 2016-02-23 DIAGNOSIS — I5023 Acute on chronic systolic (congestive) heart failure: Secondary | ICD-10-CM | POA: Diagnosis not present

## 2016-02-23 DIAGNOSIS — E1122 Type 2 diabetes mellitus with diabetic chronic kidney disease: Secondary | ICD-10-CM | POA: Diagnosis not present

## 2016-03-01 DIAGNOSIS — I4891 Unspecified atrial fibrillation: Secondary | ICD-10-CM | POA: Diagnosis not present

## 2016-03-01 DIAGNOSIS — N189 Chronic kidney disease, unspecified: Secondary | ICD-10-CM | POA: Diagnosis not present

## 2016-03-01 DIAGNOSIS — I130 Hypertensive heart and chronic kidney disease with heart failure and stage 1 through stage 4 chronic kidney disease, or unspecified chronic kidney disease: Secondary | ICD-10-CM | POA: Diagnosis not present

## 2016-03-01 DIAGNOSIS — I429 Cardiomyopathy, unspecified: Secondary | ICD-10-CM | POA: Diagnosis not present

## 2016-03-01 DIAGNOSIS — E1122 Type 2 diabetes mellitus with diabetic chronic kidney disease: Secondary | ICD-10-CM | POA: Diagnosis not present

## 2016-03-01 DIAGNOSIS — I5023 Acute on chronic systolic (congestive) heart failure: Secondary | ICD-10-CM | POA: Diagnosis not present

## 2016-03-04 ENCOUNTER — Ambulatory Visit (INDEPENDENT_AMBULATORY_CARE_PROVIDER_SITE_OTHER): Payer: Medicare Other | Admitting: Internal Medicine

## 2016-03-04 ENCOUNTER — Encounter: Payer: Self-pay | Admitting: Internal Medicine

## 2016-03-04 VITALS — BP 134/68 | HR 102 | Ht 64.0 in | Wt 154.2 lb

## 2016-03-04 DIAGNOSIS — I495 Sick sinus syndrome: Secondary | ICD-10-CM

## 2016-03-04 DIAGNOSIS — I482 Chronic atrial fibrillation, unspecified: Secondary | ICD-10-CM

## 2016-03-04 DIAGNOSIS — I5030 Unspecified diastolic (congestive) heart failure: Secondary | ICD-10-CM

## 2016-03-04 DIAGNOSIS — Z950 Presence of cardiac pacemaker: Secondary | ICD-10-CM

## 2016-03-04 LAB — CUP PACEART INCLINIC DEVICE CHECK
Brady Statistic RA Percent Paced: 0 %
Implantable Lead Implant Date: 20150721
Implantable Lead Location: 753858
Implantable Lead Location: 753860
Lead Channel Pacing Threshold Amplitude: 0.75 V
Lead Channel Pacing Threshold Pulse Width: 0.5 ms
Lead Channel Pacing Threshold Pulse Width: 0.5 ms
Lead Channel Sensing Intrinsic Amplitude: 12 mV
Lead Channel Setting Pacing Amplitude: 2.5 V
MDC IDC LEAD IMPLANT DT: 20150721
MDC IDC MSMT BATTERY REMAINING LONGEVITY: 133.2
MDC IDC MSMT BATTERY VOLTAGE: 3.02 V
MDC IDC MSMT LEADCHNL RV IMPEDANCE VALUE: 525 Ohm
MDC IDC MSMT LEADCHNL RV PACING THRESHOLD AMPLITUDE: 0.75 V
MDC IDC PG MODEL: 3242
MDC IDC SESS DTM: 20170818105915
MDC IDC SET LEADCHNL RV PACING PULSEWIDTH: 0.5 ms
MDC IDC SET LEADCHNL RV SENSING SENSITIVITY: 2 mV
MDC IDC STAT BRADY RV PERCENT PACED: 19 %
Pulse Gen Serial Number: 7594455

## 2016-03-04 MED ORDER — LISINOPRIL 10 MG PO TABS: 5.0000 mg | ORAL_TABLET | Freq: Every day | ORAL | Status: AC

## 2016-03-04 MED ORDER — METOPROLOL SUCCINATE ER 50 MG PO TB24: 50.0000 mg | ORAL_TABLET | Freq: Two times a day (BID) | ORAL | 0 refills | Status: DC

## 2016-03-04 NOTE — Patient Instructions (Signed)
Medication Instructions: Your physician has recommended you make the following change in your medication:   1. TAKE Lisinopril 0.5 tablet (5mg ) by mouth daily 2. INCREASE Metoprolol 1 tablet (50 mg) by mouth twice daily   Labwork: None Ordered  Procedures/Testing: None Ordered  Follow-Up: Your physician recommends that you schedule a follow-up appointment in: 2-3 weeks with Merita Norton, PA  Your physician wants you to follow-up in 6 MONTHS with Merita Norton, PA and 1 YEAR with Dr. Graciela Husbands. You will receive a reminder letter in the mail two months in advance. If you don't receive a letter, please call our office to schedule the follow-up appointment.    Any Additional Special Instructions Will Be Listed Below (If Applicable).     If you need a refill on your cardiac medications before your next appointment, please call your pharmacy.

## 2016-03-04 NOTE — Progress Notes (Signed)
Patient Care Team: Renford Dills, MD as PCP - General (Internal Medicine)   HPI  [name] is a 82 y.o. female Seen in followup following a recent pacemaker implanted in [place] at which time she received a CRT P. She has atrial fibrillation which is permanent associated with a rapid response.   She was admitted 4/26-4/28/17 for acute on chronic systolic CHF. Echo 09/29/2014 demonstrated EF 45-50%, moderate AS, LA severely dilated, PA pressure 52. She was diuresed and continued with rate control of afib.      Her functional status is stable according to her daughter although her daughter does state that she still relatively limited.. There is been no edema.    5/17 Cr 1.38    Coumadin and diabetes are followed at [facility]. We do not have those data.  Past Medical History:  Diagnosis Date  . Aortic stenosis    Moderate to severe August 2015   . Atrial fibrillation (HCC)    Coumadin  . Cardiomyopathy (HCC)    LVEF 40-45% August 2015  . Dyslipidemia   . Essential hypertension   . Incontinent of urine   . Osteoarthritis   . Thyroid goiter   . Type 2 diabetes mellitus (HCC)    [name]  Past Surgical History:  Procedure Laterality Date  . ABDOMINAL HYSTERECTOMY    . CARDIAC CATHETERIZATION    . PACEMAKER INSERTION     St Jude CRT-P ([place])    Current Outpatient Prescriptions  Medication Sig Dispense Refill  . calcitRIOL (ROCALTROL) 0.25 MCG capsule TAKE 1 CAPSULE (0.25 MCG TOTAL) BY MOUTH DAILY. 30 capsule 10  . diltiazem (CARDIZEM CD) 120 MG 24 hr capsule TAKE 1 CAPSULE BY MOUTH DAILY 30 capsule 1  . furosemide (LASIX) 40 MG tablet Take 1 tablet (40 mg total) by mouth 2 (two) times daily. 60 tablet 0  . glimepiride (AMARYL) 1 MG tablet Take 0.5 mg by mouth daily with breakfast.    . lisinopril (PRINIVIL,ZESTRIL) 10 MG tablet Take 1 tablet (10 mg total) by mouth daily. 30 tablet 0  . metoprolol succinate (TOPROL-XL) 50 MG 24 hr tablet Take 50 mg by  mouth daily. Take with or immediately following a meal.    . potassium chloride SA (K-DUR,KLOR-CON) 20 MEQ tablet Take 10 mEq by mouth daily.     . rosuvastatin (CRESTOR) 10 MG tablet Take 10 mg by mouth daily.    Marland Kitchen warfarin (COUMADIN) 3 MG tablet Take 1-2 tablets (3-6 mg total) by mouth daily. Take 3 mg on Monday, Wednesday, Friday, then take 6 mg all the other days (Patient taking differently: Take 3-6 mg by mouth daily. Take 6 mg on Monday, Wednesday, Thursday, then take 3 mg all the other days)     No current facility-administered medications for this visit.     No Known Allergies  Review of Systems negative except from HPI and PMH  Physical Exam BP 134/68   Pulse (!) 102   Ht 5\' 4"  (1.626 m)   Wt 154 lb 3.2 oz (69.9 kg)   BMI 26.47 kg/m  Well developed and well nourished in no acute distress HENT normal E scleral and icterus clear Neck Supple JVP 7  Clear to ausculation  Regular rate and rhythm, no murmurs gallops or rub Soft with active bowel sounds No clubbing cyanosis    Edema Alert and oriented, grossly normal motor and sensory function  Walks with walker Skin Warm and Dry  Atrial fibrillation  102 Intervals -/09/34 Nonspecific ST-T changes  Assessment and  Plan  HFpEF  Hypertension  Atrial fibrillation-permanent modestly rapid rate  Pacemaker-CRT-St. Jude   blood pressure is well-controlled  Atrial fibrillation rate is modestly rapid. After walking into the office and sitting down her heart rate was still greater than 100.  Ventricular pacing is less than 20 but We have decreased the lower rate to 50.   We will change metoprolol 50 daily--twice a day and decrease lisinopril 10--5. We'll reassess her in 2 or 3 weeks to see if this is making any improvement in functional status. In the event that it has not will revert to her prior medications.

## 2016-03-08 DIAGNOSIS — N189 Chronic kidney disease, unspecified: Secondary | ICD-10-CM | POA: Diagnosis not present

## 2016-03-08 DIAGNOSIS — E1122 Type 2 diabetes mellitus with diabetic chronic kidney disease: Secondary | ICD-10-CM | POA: Diagnosis not present

## 2016-03-08 DIAGNOSIS — I4891 Unspecified atrial fibrillation: Secondary | ICD-10-CM | POA: Diagnosis not present

## 2016-03-08 DIAGNOSIS — I429 Cardiomyopathy, unspecified: Secondary | ICD-10-CM | POA: Diagnosis not present

## 2016-03-08 DIAGNOSIS — I130 Hypertensive heart and chronic kidney disease with heart failure and stage 1 through stage 4 chronic kidney disease, or unspecified chronic kidney disease: Secondary | ICD-10-CM | POA: Diagnosis not present

## 2016-03-08 DIAGNOSIS — I5023 Acute on chronic systolic (congestive) heart failure: Secondary | ICD-10-CM | POA: Diagnosis not present

## 2016-03-10 ENCOUNTER — Encounter: Payer: Self-pay | Admitting: Physician Assistant

## 2016-03-18 ENCOUNTER — Encounter: Payer: Self-pay | Admitting: Internal Medicine

## 2016-03-24 ENCOUNTER — Ambulatory Visit (INDEPENDENT_AMBULATORY_CARE_PROVIDER_SITE_OTHER): Payer: Medicare Other | Admitting: Physician Assistant

## 2016-03-24 ENCOUNTER — Encounter: Payer: Self-pay | Admitting: Physician Assistant

## 2016-03-24 VITALS — BP 132/80 | HR 96 | Ht 64.0 in | Wt 153.0 lb

## 2016-03-24 DIAGNOSIS — I420 Dilated cardiomyopathy: Secondary | ICD-10-CM

## 2016-03-24 DIAGNOSIS — I482 Chronic atrial fibrillation: Secondary | ICD-10-CM

## 2016-03-24 DIAGNOSIS — I350 Nonrheumatic aortic (valve) stenosis: Secondary | ICD-10-CM | POA: Diagnosis not present

## 2016-03-24 DIAGNOSIS — I4821 Permanent atrial fibrillation: Secondary | ICD-10-CM

## 2016-03-24 DIAGNOSIS — I10 Essential (primary) hypertension: Secondary | ICD-10-CM

## 2016-03-24 NOTE — Patient Instructions (Signed)
Medication Instructions:   Your physician recommends that you continue on your current medications as directed. Please refer to the Current Medication list given to you today.]  If you need a refill on your cardiac medications before your next appointment, please call your pharmacy.  Labwork: NONE ORDER TODAY    Testing/Procedures: NONE ORDER TODAY    Follow-Up:  Your physician wants you to follow-up in:  IN  6  MONTHS WITH DR Logan Bores will receive a reminder letter in the mail two months in advance. If you don't receive a letter, please call our office to schedule the follow-up appointment.   Remote monitoring is used to monitor your Pacemaker of ICD from home. This monitoring reduces the number of office visits required to check your device to one time per year. It allows [place] to keep an eye on the functioning of your device to ensure it is working properly. You are scheduled for a device check from home on . 06/23/16.You may send your transmission at any time that day. If you have a wireless device, the transmission will be sent automatically. After your physician reviews your transmission, you will receive a postcard with your next transmission date.      Any Other Special Instructions Will Be Listed Below (If Applicable).

## 2016-03-24 NOTE — Progress Notes (Signed)
Cardiology Office Note Date:  03/24/2016  Patient ID:  Laura, Edwards [birth year], MRN [id number] PCP:  Katy Apo, MD  Cardiologist:  Dr. Graciela Husbands    Chief Complaint: f/u on medicine changes  History of Present Illness: [name] is a 82 y.o. female with history of permanent AFib, NICM, chronic CHF, HTN, VHD/mod AS, DM, HLD, comes in today to be seen for Dr. Graciela Husbands to assess her response to medication changes.  She was last seen by dr. Graciela Husbands 03/04/16, at that time he noted AF with fair rate control and minimal BiVe pacing and her BB increased/ACE decreased.  She comes in accompanied by her daughter, they both report she seems to be doing ok, ambulating well with her walker, no falls, able to get around the house without significant DOE, with longer walks gets more winded.  Denies any kind of CP, no palpitations, no dizziness, near syncope or syncope.  The patient and daughter tell me they fell like she is doing fairly well.  She denies any bleeding or signs of bleeding.  She is in the process of some oral/dental work pending new dentures.  Coumadin is managed with her PMD, labs done routinely.  Device History: SJM CRT-P, implanted 02/04/14 in [place] LV lead was tuned off in Jan at visit with Dr. Graciela Husbands  Past Medical History:  Diagnosis Date  . Aortic stenosis    Moderate to severe August 2015   . Atrial fibrillation (HCC)    Coumadin  . Cardiomyopathy (HCC)    LVEF 40-45% August 2015  . Dyslipidemia   . Essential hypertension   . Incontinent of urine   . Osteoarthritis   . Thyroid goiter   . Type 2 diabetes mellitus (HCC)     Past Surgical History:  Procedure Laterality Date  . ABDOMINAL HYSTERECTOMY    . CARDIAC CATHETERIZATION    . PACEMAKER INSERTION     St Jude CRT-P ([place])    Current Outpatient Prescriptions  Medication Sig Dispense Refill  . calcitRIOL (ROCALTROL) 0.25 MCG capsule TAKE 1 CAPSULE (0.25 MCG TOTAL) BY MOUTH DAILY. 30 capsule 10  .  diltiazem (CARDIZEM CD) 120 MG 24 hr capsule TAKE 1 CAPSULE BY MOUTH DAILY 30 capsule 1  . furosemide (LASIX) 40 MG tablet Take 1 tablet (40 mg total) by mouth 2 (two) times daily. 60 tablet 0  . glimepiride (AMARYL) 1 MG tablet Take 0.5 mg by mouth daily with breakfast.    . lisinopril (PRINIVIL,ZESTRIL) 10 MG tablet Take 0.5 tablets (5 mg total) by mouth daily.    . metoprolol succinate (TOPROL-XL) 50 MG 24 hr tablet Take 1 tablet (50 mg total) by mouth 2 (two) times daily. Take with or immediately following a meal. 60 tablet 0  . potassium chloride SA (K-DUR,KLOR-CON) 20 MEQ tablet Take 10 mEq by mouth daily.     . rosuvastatin (CRESTOR) 10 MG tablet Take 10 mg by mouth daily.    Marland Kitchen warfarin (COUMADIN) 4 MG tablet Take 4 mg by mouth daily.     No current facility-administered medications for this visit.     Allergies:   Review of patient's allergies indicates no known allergies.   Social History:  The patient  reports that she has never smoked. She quit smokeless tobacco use about 15 years ago. Her smokeless tobacco use included Snuff. She reports that she does not drink alcohol or use drugs.   Family History:  The patient's family history includes Hypertension in her brother.  ROS:  Please see the history of present illness.  All other systems are reviewed and otherwise negative.   PHYSICAL EXAM:  VS:  BP 132/80   Pulse 96   Ht 5\' 4"  (1.626 m)   Wt 153 lb (69.4 kg)   BMI 26.26 kg/m  BMI: Body mass index is 26.26 kg/m. Well nourished, well developed, in no acute distress  HEENT: normocephalic, atraumatic  Neck: no JVD, carotid bruits or masses Cardiac:  IRRR; 1/6 SM, no rubs, or gallops Lungs:  clear to auscultation bilaterally, no wheezing, rhonchi or rales  Abd: soft, nontender MS: no deformity, age appropriate atrophy Ext: no edema  Skin: warm and dry, no rash Neuro:  No gross deficits appreciated Psych: euthymic mood, full affect  PPM site is stable, no tethering or  discomfort   EKG:  Done 03/04/16 showed Afib 102bpm PPM interrogation today shows normal function, RV pacing only, 80% of the time HR is between 50-90, battery/lead status is good  09/29/14: TTE Study Conclusions - Left ventricle: The cavity size was normal. Wall thickness was increased in a pattern of moderate LVH. Systolic function was mildly reduced. The estimated ejection fraction was in the range of 45% to 50%. - Aortic valve: There was moderate stenosis. Valve area (VTI): 0.75 cm^2. Valve area (Vmax): 0.73 cm^2. Valve area (Vmean): 0.73 cm^2. - Mitral valve: There was mild regurgitation. - Left atrium: The atrium was severely dilated. 59mm - Right atrium: The atrium was mildly dilated. - Pulmonary arteries: Systolic pressure was moderately increased. PA peak pressure: 52 mm Hg (S).   Recent Labs: 11/11/2015: ALT 22 11/12/2015: B Natriuretic Peptide 970.6; TSH 2.251 11/15/2015: Hemoglobin 12.3; Platelets 169 11/19/2015: BUN 27; Creat 1.33; Potassium 4.9; Sodium 140  No results found for requested labs within last 8760 hours.   CrCl cannot be calculated (Patient's most recent lab result is older than the maximum 21 days allowed.).   Wt Readings from Last 3 Encounters:  03/24/16 153 lb (69.4 kg)  03/04/16 154 lb 3.2 oz (69.9 kg)  11/19/15 154 lb 12.8 oz (70.2 kg)     Other studies reviewed: Additional studies/records reviewed today include: summarized above  ASSESSMENT AND PLAN  1. Permanent AFib     CHA2DS2Vasc is at least 6, on warfarin, monitored and managed with her PMD     Rate control is improved  2. NICM, CRT-P, programmed RV pacing only     fluid status appears stable, weight is unchanged, exam appears compensated  3. HTN     Stable  4. VHF, mod AS     asymptomatic   Disposition: F/u with 62month remote check, 6 months with dr. Graciela HusbandsKlein, sooner if needed,  Current medicines are reviewed at length with the patient today.  The patient did not have  any concerns regarding medicines.  Judith BlonderSigned, Zaydrian Batta Ursy, PA-C 03/24/2016 9:57 AM     [facility] [address] [place] [address] [phone] (office)  [phone] (fax)

## 2016-04-01 ENCOUNTER — Encounter: Payer: Self-pay | Admitting: Podiatry

## 2016-04-01 ENCOUNTER — Ambulatory Visit (INDEPENDENT_AMBULATORY_CARE_PROVIDER_SITE_OTHER): Payer: Medicare Other | Admitting: Podiatry

## 2016-04-01 VITALS — Ht 64.0 in | Wt 154.0 lb

## 2016-04-01 DIAGNOSIS — M79673 Pain in unspecified foot: Secondary | ICD-10-CM

## 2016-04-01 DIAGNOSIS — B351 Tinea unguium: Secondary | ICD-10-CM | POA: Diagnosis not present

## 2016-04-01 NOTE — Progress Notes (Signed)
Patient ID: [name], female   DOB: 06/20/1927, 82 y.o.   MRN: [id number] HPI  Complaint:  Visit Type: Patient returns to my office for continued preventative foot care services. Complaint: Patient states" my nails have grown long and thick and become painful to walk and wear shoes" Patient has been diagnosed with DM with angiopathy. He presents for preventative foot care services. No changes to ROS  Podiatric Exam: Vascular: dorsalis pedis and posterior tibial pulses are negative. Capillary return is diminished. Temperature gradient is negative. Skin turgor WNL,  Sensorium: Normal Semmes Weinstein monofilament test. Normal tactile sensation bilaterally.  Nail Exam: Pt has thick disfigured discolored nails with subungual debris noted bilateral entire nail hallux through fifth toenails Ulcer Exam: There is no evidence of ulcer or pre-ulcerative changes or infection. Orthopedic Exam: Muscle tone and strength are WNL. No limitations in general ROM. No crepitus or effusions noted. Foot type and digits show no abnormalities.  Asymptomatic HAV  B/L. Skin: No Porokeratosis. No infection or ulcers  Diagnosis:  Tinea unguium, Pain in right toe, pain in left toes  Treatment & Plan Procedures and Treatment: Consent by patient was obtained for treatment procedures. The patient understood the discussion of treatment and procedures well. All questions were answered thoroughly reviewed. Debridement of mycotic and hypertrophic toenails, 1 through 5 bilateral and clearing of subungual debris. No ulceration, no infection noted.  Return Visit-Office Procedure: Patient instructed to return to the office for a follow up visit 3 months for continued evaluation and treatment.   Karter Hellmer DPM  

## 2016-04-03 DIAGNOSIS — Z23 Encounter for immunization: Secondary | ICD-10-CM | POA: Diagnosis not present

## 2016-04-07 DIAGNOSIS — Z7901 Long term (current) use of anticoagulants: Secondary | ICD-10-CM | POA: Diagnosis not present

## 2016-04-07 DIAGNOSIS — Z780 Asymptomatic menopausal state: Secondary | ICD-10-CM | POA: Diagnosis not present

## 2016-04-07 DIAGNOSIS — N183 Chronic kidney disease, stage 3 (moderate): Secondary | ICD-10-CM | POA: Diagnosis not present

## 2016-04-07 DIAGNOSIS — I482 Chronic atrial fibrillation: Secondary | ICD-10-CM | POA: Diagnosis not present

## 2016-04-07 DIAGNOSIS — I509 Heart failure, unspecified: Secondary | ICD-10-CM | POA: Diagnosis not present

## 2016-04-07 DIAGNOSIS — E119 Type 2 diabetes mellitus without complications: Secondary | ICD-10-CM | POA: Diagnosis not present

## 2016-04-07 DIAGNOSIS — E785 Hyperlipidemia, unspecified: Secondary | ICD-10-CM | POA: Diagnosis not present

## 2016-04-07 DIAGNOSIS — I10 Essential (primary) hypertension: Secondary | ICD-10-CM | POA: Diagnosis not present

## 2016-04-14 ENCOUNTER — Other Ambulatory Visit: Payer: Self-pay | Admitting: Internal Medicine

## 2016-05-04 DIAGNOSIS — Z7901 Long term (current) use of anticoagulants: Secondary | ICD-10-CM | POA: Diagnosis not present

## 2016-05-24 DIAGNOSIS — Z780 Asymptomatic menopausal state: Secondary | ICD-10-CM | POA: Diagnosis not present

## 2016-05-24 DIAGNOSIS — M810 Age-related osteoporosis without current pathological fracture: Secondary | ICD-10-CM | POA: Diagnosis not present

## 2016-06-01 DIAGNOSIS — Z7901 Long term (current) use of anticoagulants: Secondary | ICD-10-CM | POA: Diagnosis not present

## 2016-06-23 ENCOUNTER — Encounter: Payer: Medicare Other | Admitting: *Deleted

## 2016-06-23 ENCOUNTER — Telehealth: Payer: Self-pay | Admitting: Cardiology

## 2016-06-23 NOTE — Telephone Encounter (Signed)
Attempted to confirm remote transmission with pt. No answer and was unable to leave a message.   

## 2016-07-01 ENCOUNTER — Encounter: Payer: Self-pay | Admitting: Cardiology

## 2016-07-01 ENCOUNTER — Encounter: Payer: Self-pay | Admitting: Podiatry

## 2016-07-01 ENCOUNTER — Ambulatory Visit (INDEPENDENT_AMBULATORY_CARE_PROVIDER_SITE_OTHER): Payer: Medicare Other | Admitting: Podiatry

## 2016-07-01 VITALS — Ht 64.0 in | Wt 154.0 lb

## 2016-07-01 DIAGNOSIS — M79676 Pain in unspecified toe(s): Secondary | ICD-10-CM | POA: Diagnosis not present

## 2016-07-01 DIAGNOSIS — B351 Tinea unguium: Secondary | ICD-10-CM

## 2016-07-01 DIAGNOSIS — E1142 Type 2 diabetes mellitus with diabetic polyneuropathy: Secondary | ICD-10-CM | POA: Diagnosis not present

## 2016-07-01 NOTE — Progress Notes (Signed)
Patient ID: [name], female   DOB: [birth year], 82 y.o.   MRN: [id number] HPI  Complaint:  Visit Type: Patient returns to my office for continued preventative foot care services. Complaint: Patient states" my nails have grown long and thick and become painful to walk and wear shoes" Patient has been diagnosed with DM with angiopathy. He presents for preventative foot care services. No changes to ROS  Podiatric Exam: Vascular: dorsalis pedis and posterior tibial pulses are negative. Capillary return is diminished. Temperature gradient is negative. Skin turgor WNL,  Sensorium: Normal Semmes Weinstein monofilament test. Normal tactile sensation bilaterally.  Nail Exam: Pt has thick disfigured discolored nails with subungual debris noted bilateral entire nail hallux through fifth toenails Ulcer Exam: There is no evidence of ulcer or pre-ulcerative changes or infection. Orthopedic Exam: Muscle tone and strength are WNL. No limitations in general ROM. No crepitus or effusions noted. Foot type and digits show no abnormalities.  Asymptomatic HAV  B/L. Skin: No Porokeratosis. No infection or ulcers  Diagnosis:  Tinea unguium, Pain in right toe, pain in left toes  Treatment & Plan Procedures and Treatment: Consent by patient was obtained for treatment procedures. The patient understood the discussion of treatment and procedures well. All questions were answered thoroughly reviewed. Debridement of mycotic and hypertrophic toenails, 1 through 5 bilateral and clearing of subungual debris. No ulceration, no infection noted.  Return Visit-Office Procedure: Patient instructed to return to the office for a follow up visit 3 months for continued evaluation and treatment.   Helane Gunther DPM

## 2016-07-04 DIAGNOSIS — Z7901 Long term (current) use of anticoagulants: Secondary | ICD-10-CM | POA: Diagnosis not present

## 2016-08-02 DIAGNOSIS — Z7901 Long term (current) use of anticoagulants: Secondary | ICD-10-CM | POA: Diagnosis not present

## 2016-08-15 DIAGNOSIS — Z7901 Long term (current) use of anticoagulants: Secondary | ICD-10-CM | POA: Diagnosis not present

## 2016-08-22 DIAGNOSIS — Z7901 Long term (current) use of anticoagulants: Secondary | ICD-10-CM | POA: Diagnosis not present

## 2016-09-05 DIAGNOSIS — Z7901 Long term (current) use of anticoagulants: Secondary | ICD-10-CM | POA: Diagnosis not present

## 2016-09-11 DIAGNOSIS — K5900 Constipation, unspecified: Secondary | ICD-10-CM | POA: Diagnosis not present

## 2016-09-12 DIAGNOSIS — Z7901 Long term (current) use of anticoagulants: Secondary | ICD-10-CM | POA: Diagnosis not present

## 2016-09-19 DIAGNOSIS — Z794 Long term (current) use of insulin: Secondary | ICD-10-CM | POA: Diagnosis not present

## 2016-09-19 DIAGNOSIS — Z7901 Long term (current) use of anticoagulants: Secondary | ICD-10-CM | POA: Diagnosis not present

## 2016-09-19 DIAGNOSIS — I10 Essential (primary) hypertension: Secondary | ICD-10-CM | POA: Diagnosis not present

## 2016-09-19 DIAGNOSIS — E119 Type 2 diabetes mellitus without complications: Secondary | ICD-10-CM | POA: Diagnosis not present

## 2016-09-20 ENCOUNTER — Encounter: Payer: Medicare Other | Admitting: Internal Medicine

## 2016-09-23 ENCOUNTER — Ambulatory Visit (INDEPENDENT_AMBULATORY_CARE_PROVIDER_SITE_OTHER): Payer: Medicare Other | Admitting: Internal Medicine

## 2016-09-23 ENCOUNTER — Telehealth: Payer: Self-pay | Admitting: Internal Medicine

## 2016-09-23 ENCOUNTER — Encounter: Payer: Self-pay | Admitting: Internal Medicine

## 2016-09-23 VITALS — BP 124/80 | HR 92 | Ht 63.0 in | Wt 149.2 lb

## 2016-09-23 DIAGNOSIS — I4821 Permanent atrial fibrillation: Secondary | ICD-10-CM

## 2016-09-23 DIAGNOSIS — Z950 Presence of cardiac pacemaker: Secondary | ICD-10-CM | POA: Diagnosis not present

## 2016-09-23 DIAGNOSIS — I495 Sick sinus syndrome: Secondary | ICD-10-CM | POA: Diagnosis not present

## 2016-09-23 DIAGNOSIS — I482 Chronic atrial fibrillation: Secondary | ICD-10-CM | POA: Diagnosis not present

## 2016-09-23 DIAGNOSIS — I5030 Unspecified diastolic (congestive) heart failure: Secondary | ICD-10-CM | POA: Diagnosis not present

## 2016-09-23 NOTE — Patient Instructions (Addendum)
Medication Instructions:    Your physician recommends that you continue on your current medications as directed. Please refer to the Current Medication list given to you today.  --- If you need a refill on your cardiac medications before your next appointment, please call your pharmacy. ---  Labwork:  None ordered  Testing/Procedures:  None ordered  Follow-Up: Remote monitoring is used to monitor your Pacemaker of ICD from home. This monitoring reduces the number of office visits required to check your device to one time per year. It allows [place] to keep an eye on the functioning of your device to ensure it is working properly. You are scheduled for a device check from home on 12/26/2016. You may send your transmission at any time that day. If you have a wireless device, the transmission will be sent automatically. After your physician reviews your transmission, you will receive a postcard with your next transmission date.   Your physician wants you to follow-up in: 6 months with Francis Dowse, PA for Dr. Graciela Husbands.  You will receive a reminder letter in the mail two months in advance. If you don't receive a letter, please call our office to schedule the follow-up appointment.   Thank you for choosing [facility]!!

## 2016-09-23 NOTE — Telephone Encounter (Signed)
New message  Pam voiced to give her a call she'll be in the office until lunch.  Please f/u

## 2016-09-23 NOTE — Progress Notes (Signed)
Patient Care Team: Lorenda Ishihara, MD as PCP - General (Internal Medicine)   HPI  [name] is a 83 y.o. female Seen in followup following a recent pacemaker implanted in [place] at which time she received a CRT P. She has atrial fibrillation which is permanent associated with a rapid response.   She was admitted 4/26-4/28/17 for acute on chronic systolic CHF. Echo 09/29/2014 demonstrated EF 45-50%, moderate AS, LA severely dilated, PA pressure 52. She was diuresed and continued with rate control of afib.      Her functional status is stable according to her daughter although her daughter does state that she still relatively limited.No edema. Denies cp  Sleeping flat in bed    Big issue is constipation;  unfortunately associated with a lot of sugar.   5/17 Cr 1.38    Coumadin and diabetes are followed at [facility]. We do not have those data.  Past Medical History:  Diagnosis Date  . Aortic stenosis    Moderate to severe August 2015   . Atrial fibrillation (HCC)    Coumadin  . Cardiomyopathy (HCC)    LVEF 40-45% August 2015  . Dyslipidemia   . Essential hypertension   . Incontinent of urine   . Osteoarthritis   . Thyroid goiter   . Type 2 diabetes mellitus (HCC)    [name]  Past Surgical History:  Procedure Laterality Date  . ABDOMINAL HYSTERECTOMY    . CARDIAC CATHETERIZATION    . PACEMAKER INSERTION     St Jude CRT-P ([place])    Current Outpatient Prescriptions  Medication Sig Dispense Refill  . alendronate (FOSAMAX) 70 MG tablet Take 70 mg by mouth once a week. Take with a full glass of water on an empty stomach.    . calcitRIOL (ROCALTROL) 0.25 MCG capsule TAKE 1 CAPSULE (0.25 MCG TOTAL) BY MOUTH DAILY. 30 capsule 10  . diltiazem (CARDIZEM CD) 120 MG 24 hr capsule TAKE 1 CAPSULE BY MOUTH DAILY 30 capsule 1  . furosemide (LASIX) 40 MG tablet Take 1 tablet (40 mg total) by mouth 2 (two) times daily. 60 tablet 0  . glimepiride (AMARYL) 1 MG  tablet Take 0.5 mg by mouth daily with breakfast.    . lisinopril (PRINIVIL,ZESTRIL) 10 MG tablet Take 0.5 tablets (5 mg total) by mouth daily.    . metoprolol succinate (TOPROL-XL) 50 MG 24 hr tablet TAKE 1 TABLET BY MOUTH 2 TIMES DAILY. TAKE WITH OR IMMEDIATELY FOLLOWING A MEAL. 60 tablet 11  . Nutritional Supplements (VITAMIN D BOOSTER PO) Take 1,200 mg by mouth daily.    . potassium chloride SA (K-DUR,KLOR-CON) 20 MEQ tablet Take 10 mEq by mouth daily.     . rosuvastatin (CRESTOR) 10 MG tablet Take 10 mg by mouth daily.    Marland Kitchen warfarin (COUMADIN) 4 MG tablet Take 4 mg by mouth daily.     No current facility-administered medications for this visit.     No Known Allergies  Review of Systems negative except from HPI and PMH  Physical Exam BP 124/80   Pulse 92   Ht 5\' 3"  (1.6 m)   Wt 149 lb 3.2 oz (67.7 kg)   SpO2 96%   BMI 26.43 kg/m  Well developed and well nourished in no acute distress HENT normal E scleral and icterus clear Neck Supple Device pocket well healed; without hematoma or erythema.  There is no tethering  Clear to ausculation  Regular rate and rhythm, no murmurs  gallops or rub Soft with active bowel sounds  No clubbing cyanosis    Edema Alert and oriented, grossly normal motor and sensory function  Walks with walker Skin Warm and Dry  Atrial fibrillation @ 90 Intervals -/11/36 Nonspecific ST-T changes  Assessment and  Plan  HFpEF  Hypertension  Atrial fibrillation-permanent modestly rapid rate  Pacemaker-CRT-St. Jude  Constipation   On Anticoagulation;  No bleeding issues   Atrial fibrillation remained relatively low controlled. With only 1.1% of her beats faster than 100 bpm.  Diltiazem CD may becontributing to her constipation; if need be, we can stop it and increase her metoprolol further as it seemed to help in the past for augmented rate control   I have suggested however that she be in touch with her primary care physician;the family dates  constipation issue to the new medications for her osteoporosis   Euvolemic continue current meds  BP well controlled

## 2016-09-23 NOTE — Telephone Encounter (Signed)
I spoke with the [facility]- Medical Records at [facility]- she will fax the most recent CBC/ BMP for the patient to [phone].

## 2016-09-27 NOTE — Addendum Note (Signed)
Addended by: Rebbeca Paul C on: 09/27/2016 11:19 AM   Modules accepted: Orders

## 2016-10-03 DIAGNOSIS — E785 Hyperlipidemia, unspecified: Secondary | ICD-10-CM | POA: Diagnosis not present

## 2016-10-03 DIAGNOSIS — I482 Chronic atrial fibrillation: Secondary | ICD-10-CM | POA: Diagnosis not present

## 2016-10-03 DIAGNOSIS — N183 Chronic kidney disease, stage 3 (moderate): Secondary | ICD-10-CM | POA: Diagnosis not present

## 2016-10-03 DIAGNOSIS — Z7901 Long term (current) use of anticoagulants: Secondary | ICD-10-CM | POA: Diagnosis not present

## 2016-10-03 DIAGNOSIS — Z0000 Encounter for general adult medical examination without abnormal findings: Secondary | ICD-10-CM | POA: Diagnosis not present

## 2016-10-03 DIAGNOSIS — I10 Essential (primary) hypertension: Secondary | ICD-10-CM | POA: Diagnosis not present

## 2016-10-03 DIAGNOSIS — Z1389 Encounter for screening for other disorder: Secondary | ICD-10-CM | POA: Diagnosis not present

## 2016-10-03 DIAGNOSIS — E1122 Type 2 diabetes mellitus with diabetic chronic kidney disease: Secondary | ICD-10-CM | POA: Diagnosis not present

## 2016-10-03 DIAGNOSIS — M810 Age-related osteoporosis without current pathological fracture: Secondary | ICD-10-CM | POA: Diagnosis not present

## 2016-10-07 ENCOUNTER — Ambulatory Visit (INDEPENDENT_AMBULATORY_CARE_PROVIDER_SITE_OTHER): Payer: Medicare Other | Admitting: Podiatry

## 2016-10-07 ENCOUNTER — Encounter: Payer: Self-pay | Admitting: Podiatry

## 2016-10-07 DIAGNOSIS — B351 Tinea unguium: Secondary | ICD-10-CM

## 2016-10-07 DIAGNOSIS — E1142 Type 2 diabetes mellitus with diabetic polyneuropathy: Secondary | ICD-10-CM | POA: Diagnosis not present

## 2016-10-07 NOTE — Progress Notes (Signed)
Patient ID: [name], female   DOB: [birth year], 83 y.o.   MRN: [id number] HPI  Complaint:  Visit Type: Patient returns to my office for continued preventative foot care services. Complaint: Patient states" my nails have grown long and thick and become painful to walk and wear shoes" Patient has been diagnosed with DM with angiopathy. He presents for preventative foot care services. No changes to ROS  Podiatric Exam: Vascular: dorsalis pedis and posterior tibial pulses are negative. Capillary return is diminished. Temperature gradient is negative. Skin turgor WNL,  Sensorium: Normal Semmes Weinstein monofilament test. Normal tactile sensation bilaterally.  Nail Exam: Pt has thick disfigured discolored nails with subungual debris noted bilateral entire nail hallux through fifth toenails Ulcer Exam: There is no evidence of ulcer or pre-ulcerative changes or infection. Orthopedic Exam: Muscle tone and strength are WNL. No limitations in general ROM. No crepitus or effusions noted. Foot type and digits show no abnormalities.  Asymptomatic HAV  B/L. Skin: No Porokeratosis. No infection or ulcers  Diagnosis:  Tinea unguium, Pain in right toe, pain in left toes  Treatment & Plan Procedures and Treatment: Consent by patient was obtained for treatment procedures. The patient understood the discussion of treatment and procedures well. All questions were answered thoroughly reviewed. Debridement of mycotic and hypertrophic toenails, 1 through 5 bilateral and clearing of subungual debris. No ulceration, no infection noted.  Return Visit-Office Procedure: Patient instructed to return to the office for a follow up visit 4 months for continued evaluation and treatment.   Helane Gunther DPM

## 2016-10-18 LAB — CUP PACEART INCLINIC DEVICE CHECK
Brady Statistic RV Percent Paced: 6.8 %
Implantable Lead Implant Date: 20150721
Lead Channel Impedance Value: 475 Ohm
Lead Channel Pacing Threshold Amplitude: 0.75 V
Lead Channel Pacing Threshold Pulse Width: 0.5 ms
Lead Channel Sensing Intrinsic Amplitude: 12 mV
Lead Channel Setting Pacing Amplitude: 2.5 V
Lead Channel Setting Pacing Pulse Width: 0.5 ms
MDC IDC LEAD IMPLANT DT: 20150721
MDC IDC LEAD LOCATION: 753858
MDC IDC LEAD LOCATION: 753860
MDC IDC MSMT BATTERY VOLTAGE: 3.02 V
MDC IDC MSMT LEADCHNL LV IMPEDANCE VALUE: 400 Ohm
MDC IDC PG IMPLANT DT: 20150721
MDC IDC SESS DTM: 20180309144232
MDC IDC SET LEADCHNL RV SENSING SENSITIVITY: 2 mV
MDC IDC STAT BRADY RA PERCENT PACED: 0 %
Pulse Gen Serial Number: 7594455

## 2016-10-31 DIAGNOSIS — E1122 Type 2 diabetes mellitus with diabetic chronic kidney disease: Secondary | ICD-10-CM | POA: Diagnosis not present

## 2016-10-31 DIAGNOSIS — Z7901 Long term (current) use of anticoagulants: Secondary | ICD-10-CM | POA: Diagnosis not present

## 2016-10-31 DIAGNOSIS — Z7984 Long term (current) use of oral hypoglycemic drugs: Secondary | ICD-10-CM | POA: Diagnosis not present

## 2016-10-31 DIAGNOSIS — I482 Chronic atrial fibrillation: Secondary | ICD-10-CM | POA: Diagnosis not present

## 2016-10-31 DIAGNOSIS — E1165 Type 2 diabetes mellitus with hyperglycemia: Secondary | ICD-10-CM | POA: Diagnosis not present

## 2016-11-04 ENCOUNTER — Ambulatory Visit: Payer: Medicare Other | Admitting: Podiatry

## 2016-11-10 ENCOUNTER — Encounter: Payer: Medicare Other | Attending: Internal Medicine | Admitting: *Deleted

## 2016-11-10 DIAGNOSIS — E118 Type 2 diabetes mellitus with unspecified complications: Secondary | ICD-10-CM

## 2016-11-10 DIAGNOSIS — Z713 Dietary counseling and surveillance: Secondary | ICD-10-CM | POA: Diagnosis not present

## 2016-11-10 DIAGNOSIS — E119 Type 2 diabetes mellitus without complications: Secondary | ICD-10-CM | POA: Insufficient documentation

## 2016-11-10 NOTE — Patient Instructions (Signed)
Plan:  Aim for 2-3 Carb Choices per meal (30-45 grams)  Aim for 0-1 Carbs per snack if hungry  Include protein in moderation with your meals and snacks Consider reading food labels for Total Carbohydrate of foods Continue with your activity level by walking for 10-15 minutes daily as tolerated Consider checking BG at alternate times per day   Continue taking diabetes medication as directed by MD Consider keeping some soft peppermints or glucose tablets with you in case your blood sugar drops We discussed foods high in Vitamin K that should be monitored while you take Coumadin

## 2016-11-14 NOTE — Progress Notes (Signed)
Diabetes Self-Management Education  Visit Type: First/Initial  Appt. Start Time: 0900 Appt. End Time: 1030  11/14/2016  Ms. [name], identified by name and date of birth, is a 83 y.o. female with a diagnosis of Diabetes: Type 2. She is here with 3 other family members, 2 of which are daughters who state they participate in her care. Patient is relatively active especially for her age, and appears to have a good appetite and eats appropriately. She is checking her BG each AM and states they have improved since her Diabetes medication was increased.   ASSESSMENT  Height 5' 2.99" (1.6 m), weight 144 lb (65.3 kg). Body mass index is 25.51 kg/m.      Diabetes Self-Management Education - 11/10/16 0929      Visit Information   Visit Type First/Initial     Initial Visit   Diabetes Type Type 2   Are you currently following a meal plan? Yes   What type of meal plan do you follow? no salt or sugar   Are you taking your medications as prescribed? Yes   Date Diagnosed over 20years ago     Health Coping   How would you rate your overall health? Fair     Psychosocial Assessment   Patient Belief/Attitude about Diabetes Motivated to manage diabetes   Self-care barriers Unsteady gait/risk for falls   Self-management support Family   Other persons present Patient;Family Member  here with 3 family members who all participated in the visit   Patient Concerns Glycemic Control;Nutrition/Meal planning   Special Needs None   Learning Readiness Ready   How often do you need to have someone help you when you read instructions, pamphlets, or other written materials from your doctor or pharmacy? 3 - Sometimes   What is the last grade level you completed in school? 12     Pre-Education Assessment   Patient understands the diabetes disease and treatment process. Needs Review   Patient understands incorporating nutritional management into lifestyle. Needs Instruction   Patient undertands  incorporating physical activity into lifestyle. Demonstrates understanding / competency   Patient understands using medications safely. Needs Review   Patient understands monitoring blood glucose, interpreting and using results Needs Review   Patient understands prevention, detection, and treatment of acute complications. Needs Instruction   Patient understands prevention, detection, and treatment of chronic complications. Demonstrates understanding / competency   Patient understands how to develop strategies to address psychosocial issues. Demonstrates understanding / competency   Patient understands how to develop strategies to promote health/change behavior. Demonstrates understanding / competency     Complications   Last HgB A1C per patient/outside source 8.8 %   How often do you check your blood sugar? 1-2 times/day   Fasting Blood glucose range (mg/dL) 57-473  BG better since medication dose was increased   Number of hypoglycemic episodes per month 0   Have you had a dilated eye exam in the past 12 months? Yes   Have you had a dental exam in the past 12 months? Yes   Are you checking your feet? Yes   How many days per week are you checking your feet? 1     Dietary Intake   Breakfast grits or oatmeal, eggs, prunes   Snack (morning) occasionally PNB crackers or nuts   Lunch left overs including lean meat, starch and always vegetables   Snack (afternoon) fresh fruit or Sugar Free jello   Dinner same as lunch, hot meal   Snack (evening)  none after supper   Beverage(s) hot tea with lemon, water     Exercise   Exercise Type Light (walking / raking leaves)  she is able to dress herself and takes care of her own hygiene, walks in home also   How many days per week to you exercise? 4   How many minutes per day do you exercise? 15   Total minutes per week of exercise 60     Patient Education   Previous Diabetes Education No   Nutrition management  Role of diet in the treatment of  diabetes and the relationship between the three main macronutrients and blood glucose level;Food label reading, portion sizes and measuring food.;Carbohydrate counting;Reviewed blood glucose goals for pre and post meals and how to evaluate the patients' food intake on their blood glucose level.   Physical activity and exercise  Role of exercise on diabetes management, blood pressure control and cardiac health.   Medications Reviewed patients medication for diabetes, action, purpose, timing of dose and side effects.   Monitoring Purpose and frequency of SMBG.;Identified appropriate SMBG and/or A1C goals.  discussed adapted goals based on patient' age and her renal problems.    Acute complications Taught treatment of hypoglycemia - the 15 rule.   Chronic complications Relationship between chronic complications and blood glucose control   Psychosocial adjustment Helped patient identify a support system for diabetes management     Individualized Goals (developed by patient)   Nutrition Follow meal plan discussed;General guidelines for healthy choices and portions discussed   Physical Activity Exercise 3-5 times per week   Medications take my medication as prescribed   Monitoring  test blood glucose pre and post meals as discussed     Post-Education Assessment   Patient understands the diabetes disease and treatment process. Demonstrates understanding / competency   Patient understands incorporating nutritional management into lifestyle. Demonstrates understanding / competency   Patient undertands incorporating physical activity into lifestyle. Demonstrates understanding / competency   Patient understands using medications safely. Demonstrates understanding / competency   Patient understands monitoring blood glucose, interpreting and using results Demonstrates understanding / competency   Patient understands prevention, detection, and treatment of acute complications. Demonstrates understanding /  competency   Patient understands prevention, detection, and treatment of chronic complications. Demonstrates understanding / competency   Patient understands how to develop strategies to address psychosocial issues. Demonstrates understanding / competency   Patient understands how to develop strategies to promote health/change behavior. Demonstrates understanding / competency     Outcomes   Expected Outcomes Demonstrated interest in learning. Expect positive outcomes   Future DMSE 3-4 months   Program Status Completed      Individualized Plan for Diabetes Self-Management Training:   Learning Objective:  Patient will have a greater understanding of diabetes self-management. Patient education plan is to attend individual and/or group sessions per assessed needs and concerns.   Plan:   Patient Instructions  Plan:  Aim for 2-3 Carb Choices per meal (30-45 grams)  Aim for 0-1 Carbs per snack if hungry  Include protein in moderation with your meals and snacks Consider reading food labels for Total Carbohydrate of foods Continue with your activity level by walking for 10-15 minutes daily as tolerated Consider checking BG at alternate times per day   Continue taking diabetes medication as directed by MD Consider keeping some soft peppermints or glucose tablets with you in case your blood sugar drops We discussed foods high in Vitamin K that should be monitored while  you take Coumadin  Expected Outcomes:  Demonstrated interest in learning. Expect positive outcomes  Education material provided: Living Well with Diabetes, A1C conversion sheet, Meal plan card and Carbohydrate counting sheet, Foods high in Vitamin K handout  If problems or questions, patient to contact team via:  Phone  Future DSME appointment: 3-4 months

## 2016-11-18 ENCOUNTER — Other Ambulatory Visit: Payer: Self-pay

## 2016-11-18 ENCOUNTER — Encounter: Payer: Self-pay | Admitting: Family Medicine

## 2016-11-18 ENCOUNTER — Ambulatory Visit (INDEPENDENT_AMBULATORY_CARE_PROVIDER_SITE_OTHER): Payer: Medicare Other

## 2016-11-18 ENCOUNTER — Ambulatory Visit (INDEPENDENT_AMBULATORY_CARE_PROVIDER_SITE_OTHER): Payer: Medicare Other | Admitting: Family Medicine

## 2016-11-18 VITALS — BP 129/78 | HR 94 | Temp 97.4°F | Resp 16 | Ht 62.9 in | Wt 139.0 lb

## 2016-11-18 DIAGNOSIS — M25532 Pain in left wrist: Secondary | ICD-10-CM

## 2016-11-18 DIAGNOSIS — Z950 Presence of cardiac pacemaker: Secondary | ICD-10-CM | POA: Diagnosis not present

## 2016-11-18 DIAGNOSIS — M79632 Pain in left forearm: Secondary | ICD-10-CM

## 2016-11-18 DIAGNOSIS — M25432 Effusion, left wrist: Secondary | ICD-10-CM

## 2016-11-18 DIAGNOSIS — M7989 Other specified soft tissue disorders: Secondary | ICD-10-CM

## 2016-11-18 DIAGNOSIS — M19032 Primary osteoarthritis, left wrist: Secondary | ICD-10-CM | POA: Diagnosis not present

## 2016-11-18 NOTE — Patient Instructions (Addendum)
Ice the wrist while awake Apply biofreeze to the left wrist for pain and swelling   IF you received an x-ray today, you will receive an invoice from [facility]. Please contact [facility] at [phone] with questions or concerns regarding your invoice.   IF you received labwork today, you will receive an invoice from [facility]. Please contact [facility] at [phone] with questions or concerns regarding your invoice.   Our billing staff will not be able to assist you with questions regarding bills from these companies.  You will be contacted with the lab results as soon as they are available. The fastest way to get your results is to activate your My Chart account. Instructions are located on the last page of this paperwork. If you have not heard from [place] regarding the results in 2 weeks, please contact this office.    Fall Prevention in the Home Falls can cause injuries and can affect people from all age groups. There are many simple things that you can do to make your home safe and to help prevent falls. What can I do on the outside of my home?  Regularly repair the edges of walkways and driveways and fix any cracks.  Remove high doorway thresholds.  Trim any shrubbery on the main path into your home.  Use bright outdoor lighting.  Clear walkways of debris and clutter, including tools and rocks.  Regularly check that handrails are securely fastened and in good repair. Both sides of any steps should have handrails.  Install guardrails along the edges of any raised decks or porches.  Have leaves, snow, and ice cleared regularly.  Use sand or salt on walkways during winter months.  In the garage, clean up any spills right away, including grease or oil spills. What can I do in the bathroom?  Use night lights.  Install grab bars by the toilet and in the tub and shower. Do not use towel bars as grab bars.  Use non-skid mats or decals on the floor of the tub  or shower.  If you need to sit down while you are in the shower, use a plastic, non-slip stool.  Keep the floor dry. Immediately clean up any water that spills on the floor.  Remove soap buildup in the tub or shower on a regular basis.  Attach bath mats securely with double-sided non-slip rug tape.  Remove throw rugs and other tripping hazards from the floor. What can I do in the bedroom?  Use night lights.  Make sure that a bedside light is easy to reach.  Do not use oversized bedding that drapes onto the floor.  Have a firm chair that has side arms to use for getting dressed.  Remove throw rugs and other tripping hazards from the floor. What can I do in the kitchen?  Clean up any spills right away.  Avoid walking on wet floors.  Place frequently used items in easy-to-reach places.  If you need to reach for something above you, use a sturdy step stool that has a grab bar.  Keep electrical cables out of the way.  Do not use floor polish or wax that makes floors slippery. If you have to use wax, make sure that it is non-skid floor wax.  Remove throw rugs and other tripping hazards from the floor. What can I do in the stairways?  Do not leave any items on the stairs.  Make sure that there are handrails on both sides of the stairs. Fix handrails  that are broken or loose. Make sure that handrails are as long as the stairways.  Check any carpeting to make sure that it is firmly attached to the stairs. Fix any carpet that is loose or worn.  Avoid having throw rugs at the top or bottom of stairways, or secure the rugs with carpet tape to prevent them from moving.  Make sure that you have a light switch at the top of the stairs and the bottom of the stairs. If you do not have them, have them installed. What are some other fall prevention tips?  Wear closed-toe shoes that fit well and support your feet. Wear shoes that have rubber soles or low heels.  When you use a  stepladder, make sure that it is completely opened and that the sides are firmly locked. Have someone hold the ladder while you are using it. Do not climb a closed stepladder.  Add color or contrast paint or tape to grab bars and handrails in your home. Place contrasting color strips on the first and last steps.  Use mobility aids as needed, such as canes, walkers, scooters, and crutches.  Turn on lights if it is dark. Replace any light bulbs that burn out.  Set up furniture so that there are clear paths. Keep the furniture in the same spot.  Fix any uneven floor surfaces.  Choose a carpet design that does not hide the edge of steps of a stairway.  Be aware of any and all pets.  Review your medicines with your healthcare provider. Some medicines can cause dizziness or changes in blood pressure, which increase your risk of falling. Talk with your health care provider about other ways that you can decrease your risk of falls. This may include working with a physical therapist or trainer to improve your strength, balance, and endurance. This information is not intended to replace advice given to you by your health care provider. Make sure you discuss any questions you have with your health care provider. Document Released: 06/24/2002 Document Revised: 12/01/2015 Document Reviewed: 08/08/2014 Elsevier Interactive Patient Education  2017 [employer].

## 2016-11-18 NOTE — Progress Notes (Signed)
Chief Complaint  Patient presents with  . Fall    injury to left wrist    HPI   Pt reports that she has was going to the bathroom this morning 3am when she fell unto her outstretched arm  She hurt her left writs She did not hit her head  She denies swelling   She is a right handed female She lives with her daughter She uses a Conservation officer, nature in situ She states that the pacemaker did not fire She did not lose consciousness or feel dizzy  Past Medical History:  Diagnosis Date  . Aortic stenosis    Moderate to severe August 2015   . Atrial fibrillation (HCC)    Coumadin  . Cardiomyopathy (HCC)    LVEF 40-45% August 2015  . CHF (congestive heart failure) (HCC)   . Dyslipidemia   . Essential hypertension   . Incontinent of urine   . Osteoarthritis   . Thyroid goiter   . Type 2 diabetes mellitus (HCC)     Current Outpatient Prescriptions  Medication Sig Dispense Refill  . digoxin (LANOXIN) 0.125 MG tablet Take by mouth daily.    Marland Kitchen alendronate (FOSAMAX) 70 MG tablet Take 70 mg by mouth once a week. Take with a full glass of water on an empty stomach.    . calcitRIOL (ROCALTROL) 0.25 MCG capsule TAKE 1 CAPSULE (0.25 MCG TOTAL) BY MOUTH DAILY. 30 capsule 10  . diltiazem (CARDIZEM CD) 120 MG 24 hr capsule TAKE 1 CAPSULE BY MOUTH DAILY 30 capsule 1  . furosemide (LASIX) 40 MG tablet Take 1 tablet (40 mg total) by mouth 2 (two) times daily. 60 tablet 0  . glimepiride (AMARYL) 1 MG tablet Take 0.5 mg by mouth daily with breakfast.    . lisinopril (PRINIVIL,ZESTRIL) 10 MG tablet Take 0.5 tablets (5 mg total) by mouth daily.    . metoprolol succinate (TOPROL-XL) 50 MG 24 hr tablet TAKE 1 TABLET BY MOUTH 2 TIMES DAILY. TAKE WITH OR IMMEDIATELY FOLLOWING A MEAL. 60 tablet 11  . Nutritional Supplements (VITAMIN D BOOSTER PO) Take 1,200 mg by mouth daily.    . potassium chloride SA (K-DUR,KLOR-CON) 20 MEQ tablet Take 10 mEq by mouth daily.     . rosuvastatin (CRESTOR) 10  MG tablet Take 10 mg by mouth daily.    Marland Kitchen warfarin (COUMADIN) 4 MG tablet Take 4 mg by mouth daily.     No current facility-administered medications for this visit.     Allergies: No Known Allergies  Past Surgical History:  Procedure Laterality Date  . ABDOMINAL HYSTERECTOMY    . CARDIAC CATHETERIZATION    . PACEMAKER INSERTION     St Jude CRT-P ([place])    Social History   Social History  . Marital status: Widowed    Spouse name: N/A  . Number of children: N/A  . Years of education: N/A   Social History Main Topics  . Smoking status: Never Smoker  . Smokeless tobacco: Former Neurosurgeon    Types: Snuff    Quit date: 07/18/2000  . Alcohol use No  . Drug use: No  . Sexual activity: Not Asked   Other Topics Concern  . None   Social History Narrative  . None    ROS See hpi  Objective: Vitals:   11/18/16 1132  BP: 129/78  Pulse: 94  Resp: 16  Temp: 97.4 F (36.3 C)  TempSrc: Oral  SpO2: 97%  Weight: 139 lb (63 kg)  Height: 5' 2.9" (1.598 m)    Physical Exam  Constitutional: She appears well-developed and well-nourished.  HENT:  Head: Normocephalic and atraumatic.  Musculoskeletal:       Left shoulder: Normal. She exhibits normal range of motion and no tenderness.       Left elbow: Normal. She exhibits normal range of motion and no swelling. No tenderness found.       Right wrist: Normal. She exhibits normal range of motion and no tenderness.       Left wrist: She exhibits tenderness and swelling. She exhibits normal range of motion, no bony tenderness, no effusion, no crepitus, no deformity and no laceration.    EXAM: LEFT FOREARM - 2 VIEW  COMPARISON:  None  FINDINGS: Osseous demineralization.  Radiocarpal joint space narrowing.  No acute fracture, dislocation or bone destruction.  Significant small vessel vascular calcifications LEFT forearm.  IMPRESSION: Osseous demineralization with degenerative changes at the wrist.  No acute  abnormalities   Electronically Signed   By: Ulyses Southward M.D.   On: 11/18/2016 12:08   Assessment and Plan Harry was seen today for fall.  Diagnoses and all orders for this visit:  Pain and swelling of left forearm Pain and swelling of left wrist- RICE reviewed -     DG Forearm Left; Future -     DG Forearm Left -     DG Forearm Left; Future -     DG Forearm Left  Cardiac pacemaker in situ Given pt's history of MI will not give nsaids Advised to use biofreeze Discussed RICE   Kycen Spalla A Creta Levin

## 2016-11-28 DIAGNOSIS — Z7901 Long term (current) use of anticoagulants: Secondary | ICD-10-CM | POA: Diagnosis not present

## 2016-12-20 DIAGNOSIS — H2513 Age-related nuclear cataract, bilateral: Secondary | ICD-10-CM | POA: Diagnosis not present

## 2016-12-26 ENCOUNTER — Telehealth: Payer: Self-pay | Admitting: Cardiology

## 2016-12-26 ENCOUNTER — Encounter: Payer: Medicare Other | Admitting: *Deleted

## 2016-12-26 NOTE — Telephone Encounter (Signed)
Attempted to confirm remote transmission with pt. No answer and was unable to leave a message.   

## 2016-12-27 DIAGNOSIS — Z7901 Long term (current) use of anticoagulants: Secondary | ICD-10-CM | POA: Diagnosis not present

## 2016-12-30 ENCOUNTER — Encounter: Payer: Self-pay | Admitting: Cardiology

## 2017-01-02 DIAGNOSIS — H01021 Squamous blepharitis right upper eyelid: Secondary | ICD-10-CM | POA: Diagnosis not present

## 2017-01-02 DIAGNOSIS — H04123 Dry eye syndrome of bilateral lacrimal glands: Secondary | ICD-10-CM | POA: Diagnosis not present

## 2017-01-02 DIAGNOSIS — H01022 Squamous blepharitis right lower eyelid: Secondary | ICD-10-CM | POA: Diagnosis not present

## 2017-01-02 DIAGNOSIS — H25812 Combined forms of age-related cataract, left eye: Secondary | ICD-10-CM | POA: Diagnosis not present

## 2017-01-02 DIAGNOSIS — H01025 Squamous blepharitis left lower eyelid: Secondary | ICD-10-CM | POA: Diagnosis not present

## 2017-01-02 DIAGNOSIS — Z961 Presence of intraocular lens: Secondary | ICD-10-CM | POA: Diagnosis not present

## 2017-01-02 DIAGNOSIS — H01024 Squamous blepharitis left upper eyelid: Secondary | ICD-10-CM | POA: Diagnosis not present

## 2017-01-16 DIAGNOSIS — H25812 Combined forms of age-related cataract, left eye: Secondary | ICD-10-CM | POA: Diagnosis not present

## 2017-01-20 ENCOUNTER — Ambulatory Visit (INDEPENDENT_AMBULATORY_CARE_PROVIDER_SITE_OTHER): Payer: Medicare Other | Admitting: Podiatry

## 2017-01-20 ENCOUNTER — Encounter: Payer: Self-pay | Admitting: Podiatry

## 2017-01-20 DIAGNOSIS — M79676 Pain in unspecified toe(s): Secondary | ICD-10-CM

## 2017-01-20 DIAGNOSIS — E1142 Type 2 diabetes mellitus with diabetic polyneuropathy: Secondary | ICD-10-CM

## 2017-01-20 DIAGNOSIS — B351 Tinea unguium: Secondary | ICD-10-CM | POA: Diagnosis not present

## 2017-01-20 NOTE — Progress Notes (Signed)
Patient ID: [name], female   DOB: 01/31/1927, 83 y.o.   MRN: [id number] HPI  Complaint:  Visit Type: Patient returns to my office for continued preventative foot care services. Complaint: Patient states" my nails have grown long and thick and become painful to walk and wear shoes" Patient has been diagnosed with DM with angiopathy. He presents for preventative foot care services. No changes to ROS  Podiatric Exam: Vascular: dorsalis pedis and posterior tibial pulses are negative. Capillary return is diminished. Temperature gradient is negative. Skin turgor WNL,  Sensorium: Normal Semmes Weinstein monofilament test. Normal tactile sensation bilaterally.  Nail Exam: Pt has thick disfigured discolored nails with subungual debris noted bilateral entire nail hallux through fifth toenails Ulcer Exam: There is no evidence of ulcer or pre-ulcerative changes or infection. Orthopedic Exam: Muscle tone and strength are WNL. No limitations in general ROM. No crepitus or effusions noted. Foot type and digits show no abnormalities.  Asymptomatic HAV  B/L. Skin: No Porokeratosis. No infection or ulcers  Diagnosis:  Tinea unguium, Pain in right toe, pain in left toes  Treatment & Plan Procedures and Treatment: Consent by patient was obtained for treatment procedures. The patient understood the discussion of treatment and procedures well. All questions were answered thoroughly reviewed. Debridement of mycotic and hypertrophic toenails, 1 through 5 bilateral and clearing of subungual debris. No ulceration, no infection noted.  Return Visit-Office Procedure: Patient instructed to return to the office for a follow up visit 4 months for continued evaluation and treatment.   Trany Chernick DPM  

## 2017-01-24 DIAGNOSIS — Z7901 Long term (current) use of anticoagulants: Secondary | ICD-10-CM | POA: Diagnosis not present

## 2017-02-03 DIAGNOSIS — Z7901 Long term (current) use of anticoagulants: Secondary | ICD-10-CM | POA: Diagnosis not present

## 2017-02-03 DIAGNOSIS — E1122 Type 2 diabetes mellitus with diabetic chronic kidney disease: Secondary | ICD-10-CM | POA: Diagnosis not present

## 2017-02-03 DIAGNOSIS — I10 Essential (primary) hypertension: Secondary | ICD-10-CM | POA: Diagnosis not present

## 2017-02-03 DIAGNOSIS — E785 Hyperlipidemia, unspecified: Secondary | ICD-10-CM | POA: Diagnosis not present

## 2017-02-08 DIAGNOSIS — H2512 Age-related nuclear cataract, left eye: Secondary | ICD-10-CM | POA: Diagnosis not present

## 2017-02-08 DIAGNOSIS — H2589 Other age-related cataract: Secondary | ICD-10-CM | POA: Diagnosis not present

## 2017-02-10 ENCOUNTER — Ambulatory Visit: Payer: Medicare Other | Admitting: *Deleted

## 2017-02-22 DIAGNOSIS — Z7901 Long term (current) use of anticoagulants: Secondary | ICD-10-CM | POA: Diagnosis not present

## 2017-03-01 ENCOUNTER — Encounter: Payer: Medicare Other | Attending: Internal Medicine | Admitting: *Deleted

## 2017-03-01 DIAGNOSIS — E119 Type 2 diabetes mellitus without complications: Secondary | ICD-10-CM | POA: Diagnosis not present

## 2017-03-01 DIAGNOSIS — Z713 Dietary counseling and surveillance: Secondary | ICD-10-CM | POA: Diagnosis not present

## 2017-03-01 DIAGNOSIS — E118 Type 2 diabetes mellitus with unspecified complications: Secondary | ICD-10-CM

## 2017-03-01 NOTE — Progress Notes (Signed)
Diabetes Self-Management Education  Visit Type:  Follow-up  Appt. Start Time: 1400 Appt. End Time: 1500  03/01/2017  [name], identified by name and date of birth, is a 83 y.o. female with a diagnosis of Diabetes:. She is here with her daughter who she lives with and who cares for her. Weight loss of 8 pounds since last visit in April noted. I called MD office to obtain most recent A1c which was 9.2% in July this year. Daughter stated that glimepiride was increased from 0.5 to 1.0 mg / day in July. Daughter states no issues with hypoglycemia. Diet history indicates an average of only 1 carb choice per meal and with weight loss I will discuss increasing to 2 carb choices per meal and adding in some snacks. Patient is doing some arm chair exercises several times a day.    ASSESSMENT  Height 5\' 3"  (1.6 m), weight 136 lb 4.8 oz (61.8 kg). Body mass index is 24.14 kg/m.       Diabetes Self-Management Education - 03/01/17 1508      Psychosocial Assessment   Patient Belief/Attitude about Diabetes Motivated to manage diabetes   Self-care barriers Other (comment)  less mobility as aging   Self-management support Family   Patient Concerns Nutrition/Meal planning;Glycemic Control   Learning Readiness Change in progress     Complications   Last HgB A1C per patient/outside source 9.2 %   How often do you check your blood sugar? 1-2 times/day   Fasting Blood glucose range (mg/dL) 786-767   Number of hypoglycemic episodes per month 0     Dietary Intake   Breakfast eggs, grits    Lunch leam meat, starch and vegetables, usually green beans   Dinner same as lunch   Beverage(s) hot water with lemon, water, hot tea     Exercise   Exercise Type ADL's   How many days per week to you exercise? 5   How many minutes per day do you exercise? 10   Total minutes per week of exercise 50     Patient Education   Previous Diabetes Education Yes (please comment)  3 months ago   Nutrition  management  Meal options for control of blood glucose level and chronic complications.;Carbohydrate counting  increase carb choices from 1 to 2 at each meal, add in snacks to increaase calories and help prevent further weight loss   Physical activity and exercise  Helped patient identify appropriate exercises in relation to his/her diabetes, diabetes complications and other health issue.   Medications Reviewed patients medication for diabetes, action, purpose, timing of dose and side effects.   Monitoring Identified appropriate SMBG and/or A1C goals.     Individualized Goals (developed by patient)   Nutrition General guidelines for healthy choices and portions discussed;Follow meal plan discussed   Physical Activity Exercise 3-5 times per week   Medications take my medication as prescribed   Monitoring  test blood glucose pre and post meals as discussed   Reducing Risk examine blood glucose patterns     Patient Self-Evaluation of Goals - Patient rates self as meeting previously set goals (% of time)   Nutrition >75%   Physical Activity >75%   Medications >75%   Monitoring >75%   Problem Solving 50 - 75 %     Post-Education Assessment   Patient understands the diabetes disease and treatment process. Demonstrates understanding / competency   Patient understands incorporating nutritional management into lifestyle. Demonstrates understanding / competency   Patient  undertands incorporating physical activity into lifestyle. Demonstrates understanding / competency   Patient understands using medications safely. Demonstrates understanding / competency   Patient understands monitoring blood glucose, interpreting and using results Demonstrates understanding / competency   Patient understands prevention, detection, and treatment of acute complications. Demonstrates understanding / competency   Patient understands prevention, detection, and treatment of chronic complications. Demonstrates  understanding / competency     Outcomes   Program Status Completed     Subsequent Visit   Since your last visit have you continued or begun to take your medications as prescribed? Yes   Since your last visit have you experienced any weight changes? Loss   Weight Loss (lbs) 8   Since your last visit, are you checking your blood glucose at least once a day? Yes  daughter is checking every AM      Learning Objective:  Patient will have a greater understanding of diabetes self-management. Patient education plan is to attend individual and/or group sessions per assessed needs and concerns. Discussed the affect of stress on BG per daughter's request. Offered options for stress management, and reinforced value of arm chair exercises.   Plan:   Patient Instructions  Plan:  Aim for 2- Carb Choices per meal (30 grams)  Aim for 0-1 Carbs per snack if hungry  Include protein in moderation with your meals and snacks Consider reading food labels for Total Carbohydrate of foods Continue with your activity level by walking for 5-10 minutes daily as tolerated Continue checking BG each day   Continue taking diabetes medication as directed by MD  Expected Outcomes:  Demonstrated interest in learning. Expect positive outcomes  Education material provided: Snack sheet  If problems or questions, patient to contact team via:  Phone  Future DSME appointment: - 3-4 months

## 2017-03-01 NOTE — Patient Instructions (Addendum)
Plan:  Aim for 2- Carb Choices per meal (30 grams)  Aim for 0-1 Carbs per snack if hungry  Include protein in moderation with your meals and snacks Consider reading food labels for Total Carbohydrate of foods Continue with your activity level by walking for 5-10 minutes daily as tolerated Continue checking BG each day   Continue taking diabetes medication as directed by MD

## 2017-03-11 ENCOUNTER — Inpatient Hospital Stay (HOSPITAL_COMMUNITY)
Admission: EM | Admit: 2017-03-11 | Discharge: 2017-03-21 | DRG: 291 | Disposition: A | Discharge status: Home Health | Payer: Medicare Other | Attending: Nephrology | Admitting: Nephrology

## 2017-03-11 ENCOUNTER — Encounter (HOSPITAL_COMMUNITY): Payer: Self-pay | Admitting: *Deleted

## 2017-03-11 ENCOUNTER — Emergency Department (HOSPITAL_COMMUNITY): Payer: Medicare Other

## 2017-03-11 ENCOUNTER — Other Ambulatory Visit: Payer: Self-pay

## 2017-03-11 DIAGNOSIS — E118 Type 2 diabetes mellitus with unspecified complications: Secondary | ICD-10-CM

## 2017-03-11 DIAGNOSIS — Z6824 Body mass index (BMI) 24.0-24.9, adult: Secondary | ICD-10-CM

## 2017-03-11 DIAGNOSIS — G9341 Metabolic encephalopathy: Secondary | ICD-10-CM | POA: Diagnosis not present

## 2017-03-11 DIAGNOSIS — I509 Heart failure, unspecified: Secondary | ICD-10-CM

## 2017-03-11 DIAGNOSIS — I10 Essential (primary) hypertension: Secondary | ICD-10-CM | POA: Diagnosis present

## 2017-03-11 DIAGNOSIS — I272 Pulmonary hypertension, unspecified: Secondary | ICD-10-CM | POA: Diagnosis present

## 2017-03-11 DIAGNOSIS — M1991 Primary osteoarthritis, unspecified site: Secondary | ICD-10-CM | POA: Diagnosis present

## 2017-03-11 DIAGNOSIS — R0602 Shortness of breath: Secondary | ICD-10-CM | POA: Diagnosis not present

## 2017-03-11 DIAGNOSIS — E119 Type 2 diabetes mellitus without complications: Secondary | ICD-10-CM

## 2017-03-11 DIAGNOSIS — I130 Hypertensive heart and chronic kidney disease with heart failure and stage 1 through stage 4 chronic kidney disease, or unspecified chronic kidney disease: Principal | ICD-10-CM | POA: Diagnosis present

## 2017-03-11 DIAGNOSIS — R7989 Other specified abnormal findings of blood chemistry: Secondary | ICD-10-CM

## 2017-03-11 DIAGNOSIS — R778 Other specified abnormalities of plasma proteins: Secondary | ICD-10-CM | POA: Diagnosis present

## 2017-03-11 DIAGNOSIS — R748 Abnormal levels of other serum enzymes: Secondary | ICD-10-CM | POA: Diagnosis not present

## 2017-03-11 DIAGNOSIS — E1122 Type 2 diabetes mellitus with diabetic chronic kidney disease: Secondary | ICD-10-CM | POA: Diagnosis present

## 2017-03-11 DIAGNOSIS — Z79899 Other long term (current) drug therapy: Secondary | ICD-10-CM

## 2017-03-11 DIAGNOSIS — I472 Ventricular tachycardia: Secondary | ICD-10-CM | POA: Diagnosis not present

## 2017-03-11 DIAGNOSIS — E876 Hypokalemia: Secondary | ICD-10-CM | POA: Diagnosis not present

## 2017-03-11 DIAGNOSIS — I248 Other forms of acute ischemic heart disease: Secondary | ICD-10-CM | POA: Diagnosis not present

## 2017-03-11 DIAGNOSIS — I5023 Acute on chronic systolic (congestive) heart failure: Secondary | ICD-10-CM

## 2017-03-11 DIAGNOSIS — Z950 Presence of cardiac pacemaker: Secondary | ICD-10-CM

## 2017-03-11 DIAGNOSIS — I429 Cardiomyopathy, unspecified: Secondary | ICD-10-CM | POA: Diagnosis present

## 2017-03-11 DIAGNOSIS — N179 Acute kidney failure, unspecified: Secondary | ICD-10-CM | POA: Diagnosis not present

## 2017-03-11 DIAGNOSIS — Z45018 Encounter for adjustment and management of other part of cardiac pacemaker: Secondary | ICD-10-CM

## 2017-03-11 DIAGNOSIS — E440 Moderate protein-calorie malnutrition: Secondary | ICD-10-CM | POA: Diagnosis present

## 2017-03-11 DIAGNOSIS — I110 Hypertensive heart disease with heart failure: Secondary | ICD-10-CM | POA: Diagnosis not present

## 2017-03-11 DIAGNOSIS — I482 Chronic atrial fibrillation, unspecified: Secondary | ICD-10-CM | POA: Diagnosis present

## 2017-03-11 DIAGNOSIS — Z7901 Long term (current) use of anticoagulants: Secondary | ICD-10-CM

## 2017-03-11 DIAGNOSIS — G4700 Insomnia, unspecified: Secondary | ICD-10-CM | POA: Diagnosis present

## 2017-03-11 DIAGNOSIS — I350 Nonrheumatic aortic (valve) stenosis: Secondary | ICD-10-CM

## 2017-03-11 DIAGNOSIS — E1165 Type 2 diabetes mellitus with hyperglycemia: Secondary | ICD-10-CM | POA: Diagnosis not present

## 2017-03-11 DIAGNOSIS — I513 Intracardiac thrombosis, not elsewhere classified: Secondary | ICD-10-CM | POA: Diagnosis not present

## 2017-03-11 DIAGNOSIS — I4891 Unspecified atrial fibrillation: Secondary | ICD-10-CM | POA: Diagnosis not present

## 2017-03-11 DIAGNOSIS — N183 Chronic kidney disease, stage 3 (moderate): Secondary | ICD-10-CM | POA: Diagnosis present

## 2017-03-11 DIAGNOSIS — G9340 Encephalopathy, unspecified: Secondary | ICD-10-CM | POA: Clinically undetermined

## 2017-03-11 DIAGNOSIS — E785 Hyperlipidemia, unspecified: Secondary | ICD-10-CM | POA: Diagnosis present

## 2017-03-11 HISTORY — DX: Chronic atrial fibrillation, unspecified: I48.20

## 2017-03-11 HISTORY — DX: Nonrheumatic aortic (valve) stenosis: I35.0

## 2017-03-11 HISTORY — DX: Chronic systolic (congestive) heart failure: I50.22

## 2017-03-11 LAB — CBC
HEMATOCRIT: 40.1 % (ref 36.0–46.0)
Hemoglobin: 13 g/dL (ref 12.0–15.0)
MCH: 29.9 pg (ref 26.0–34.0)
MCHC: 32.4 g/dL (ref 30.0–36.0)
MCV: 92.2 fL (ref 78.0–100.0)
PLATELETS: 167 10*3/uL (ref 150–400)
RBC: 4.35 MIL/uL (ref 3.87–5.11)
RDW: 14.3 % (ref 11.5–15.5)
WBC: 7.3 10*3/uL (ref 4.0–10.5)

## 2017-03-11 LAB — PROTIME-INR
INR: 1.76
Prothrombin Time: 20.7 seconds — ABNORMAL HIGH (ref 11.4–15.2)

## 2017-03-11 LAB — GLUCOSE, CAPILLARY: Glucose-Capillary: 307 mg/dL — ABNORMAL HIGH (ref 65–99)

## 2017-03-11 LAB — TROPONIN I: TROPONIN I: 0.1 ng/mL — AB (ref <0.03)

## 2017-03-11 LAB — BASIC METABOLIC PANEL
ANION GAP: 9 (ref 5–15)
BUN: 18 mg/dL (ref 6–20)
CO2: 24 mmol/L (ref 22–32)
Calcium: 9.9 mg/dL (ref 8.9–10.3)
Chloride: 105 mmol/L (ref 101–111)
Creatinine, Ser: 1.07 mg/dL — ABNORMAL HIGH (ref 0.44–1.00)
GFR, EST AFRICAN AMERICAN: 52 mL/min — AB (ref >60)
GFR, EST NON AFRICAN AMERICAN: 45 mL/min — AB (ref >60)
Glucose, Bld: 288 mg/dL — ABNORMAL HIGH (ref 65–99)
POTASSIUM: 3.4 mmol/L — AB (ref 3.5–5.1)
SODIUM: 138 mmol/L (ref 135–145)

## 2017-03-11 LAB — HEMOGLOBIN A1C
HEMOGLOBIN A1C: 9.1 % — AB (ref 4.8–5.6)
MEAN PLASMA GLUCOSE: 214.47 mg/dL

## 2017-03-11 LAB — BRAIN NATRIURETIC PEPTIDE: B NATRIURETIC PEPTIDE 5: 1286.5 pg/mL — AB (ref 0.0–100.0)

## 2017-03-11 LAB — I-STAT TROPONIN, ED: Troponin i, poc: 0.18 ng/mL (ref 0.00–0.08)

## 2017-03-11 MED ORDER — DILTIAZEM HCL ER COATED BEADS 120 MG PO CP24
120.0000 mg | ORAL_CAPSULE | Freq: Every day | ORAL | Status: DC
  Administered 2017-03-12 – 2017-03-15 (×4): 120 mg via ORAL
  Filled 2017-03-11 (×4): qty 1

## 2017-03-11 MED ORDER — WARFARIN - PHARMACIST DOSING INPATIENT: Freq: Every day | Status: DC

## 2017-03-11 MED ORDER — CALCITRIOL 0.25 MCG PO CAPS
0.2500 ug | ORAL_CAPSULE | Freq: Every day | ORAL | Status: DC
  Administered 2017-03-12 – 2017-03-21 (×10): 0.25 ug via ORAL
  Filled 2017-03-11 (×10): qty 1

## 2017-03-11 MED ORDER — ACETAMINOPHEN 650 MG RE SUPP: 650.0000 mg | Freq: Four times a day (QID) | RECTAL | Status: DC | PRN

## 2017-03-11 MED ORDER — POTASSIUM CHLORIDE CRYS ER 10 MEQ PO TBCR: 10.0000 meq | EXTENDED_RELEASE_TABLET | Freq: Every day | ORAL | Status: DC

## 2017-03-11 MED ORDER — SODIUM CHLORIDE 0.9% FLUSH
3.0000 mL | Freq: Two times a day (BID) | INTRAVENOUS | Status: DC
  Administered 2017-03-11 – 2017-03-19 (×8): 3 mL via INTRAVENOUS

## 2017-03-11 MED ORDER — POTASSIUM CHLORIDE CRYS ER 20 MEQ PO TBCR
20.0000 meq | EXTENDED_RELEASE_TABLET | Freq: Once | ORAL | Status: AC
  Administered 2017-03-11: 20 meq via ORAL
  Filled 2017-03-11: qty 1

## 2017-03-11 MED ORDER — INSULIN ASPART 100 UNIT/ML ~~LOC~~ SOLN
0.0000 [IU] | Freq: Every day | SUBCUTANEOUS | Status: DC
  Administered 2017-03-11: 4 [IU] via SUBCUTANEOUS
  Administered 2017-03-13 – 2017-03-15 (×3): 2 [IU] via SUBCUTANEOUS
  Administered 2017-03-16: 3 [IU] via SUBCUTANEOUS
  Administered 2017-03-17 – 2017-03-20 (×3): 2 [IU] via SUBCUTANEOUS

## 2017-03-11 MED ORDER — FUROSEMIDE 10 MG/ML IJ SOLN
40.0000 mg | Freq: Once | INTRAMUSCULAR | Status: AC
  Administered 2017-03-11: 40 mg via INTRAVENOUS
  Filled 2017-03-11: qty 4

## 2017-03-11 MED ORDER — METOPROLOL SUCCINATE ER 50 MG PO TB24: 50.0000 mg | ORAL_TABLET | Freq: Every day | ORAL | Status: DC

## 2017-03-11 MED ORDER — INSULIN ASPART 100 UNIT/ML ~~LOC~~ SOLN
0.0000 [IU] | Freq: Three times a day (TID) | SUBCUTANEOUS | Status: DC
  Administered 2017-03-12: 2 [IU] via SUBCUTANEOUS
  Administered 2017-03-12: 3 [IU] via SUBCUTANEOUS
  Administered 2017-03-12 – 2017-03-13 (×3): 2 [IU] via SUBCUTANEOUS
  Administered 2017-03-13 – 2017-03-14 (×2): 5 [IU] via SUBCUTANEOUS
  Administered 2017-03-14: 3 [IU] via SUBCUTANEOUS

## 2017-03-11 MED ORDER — HYDRALAZINE HCL 20 MG/ML IJ SOLN: 10.0000 mg | Freq: Four times a day (QID) | INTRAMUSCULAR | Status: DC | PRN

## 2017-03-11 MED ORDER — FUROSEMIDE 10 MG/ML IJ SOLN
40.0000 mg | Freq: Two times a day (BID) | INTRAMUSCULAR | Status: DC
  Administered 2017-03-12 – 2017-03-13 (×3): 40 mg via INTRAVENOUS
  Filled 2017-03-11 (×3): qty 4

## 2017-03-11 MED ORDER — VITAMIN D 1000 UNITS PO TABS
1000.0000 [IU] | ORAL_TABLET | Freq: Every day | ORAL | Status: DC
  Administered 2017-03-12 – 2017-03-21 (×10): 1000 [IU] via ORAL
  Filled 2017-03-11 (×10): qty 1

## 2017-03-11 MED ORDER — DIGOXIN 125 MCG PO TABS: 0.1250 mg | ORAL_TABLET | Freq: Every day | ORAL | Status: DC

## 2017-03-11 MED ORDER — SODIUM CHLORIDE 0.9% FLUSH: 3.0000 mL | INTRAVENOUS | Status: DC | PRN

## 2017-03-11 MED ORDER — ACETAMINOPHEN 325 MG PO TABS
650.0000 mg | ORAL_TABLET | Freq: Four times a day (QID) | ORAL | Status: DC | PRN
Start: 2017-03-11 — End: 2017-03-21

## 2017-03-11 MED ORDER — LISINOPRIL 5 MG PO TABS
5.0000 mg | ORAL_TABLET | Freq: Every day | ORAL | Status: DC
  Administered 2017-03-12 – 2017-03-21 (×10): 5 mg via ORAL
  Filled 2017-03-11 (×10): qty 1

## 2017-03-11 MED ORDER — ROSUVASTATIN CALCIUM 10 MG PO TABS
10.0000 mg | ORAL_TABLET | Freq: Every day | ORAL | Status: DC
  Administered 2017-03-12 – 2017-03-20 (×9): 10 mg via ORAL
  Filled 2017-03-11 (×10): qty 1

## 2017-03-11 MED ORDER — METOPROLOL SUCCINATE ER 50 MG PO TB24
50.0000 mg | ORAL_TABLET | Freq: Two times a day (BID) | ORAL | Status: DC
  Administered 2017-03-11 – 2017-03-15 (×8): 50 mg via ORAL
  Filled 2017-03-11 (×8): qty 1

## 2017-03-11 MED ORDER — SODIUM CHLORIDE 0.9 % IV SOLN: 250.0000 mL | INTRAVENOUS | Status: DC | PRN

## 2017-03-11 NOTE — ED Provider Notes (Signed)
[facility]-EMERGENCY DEPT Provider Note   CSN: [id number] Arrival date & time: 03/11/17  1410     History   Chief Complaint Chief Complaint  Patient presents with  . Insomnia  . Congestive Heart Failure    HPI [name] is a 83 y.o. female.  Patient with history of congestive heart failure, St. Jude pacemaker placement, atrial fibrillation on warfarin, cardiomyopathy, chronically elevated troponin -- presents with increasing shortness of breath with activity and difficulty lying flat over the past 1 week. Family notes that patient has had decreased energy and has been slower to do activities at home. Patient is on Lasix 40 mg twice a day. She has been taking her doses as prescribed. Patient was seen at a walk-in clinic earlier today andtold her to the hospital for an x-ray. No reported fevers, URI symptoms. Patient has had a cough productive of yellow sputum. No abdominal pain, vomiting, diarrhea, urinary symptoms. No skin rashes. Patient has not noticed any appreciable lower extremity edema. The onset of this condition was acute. The course is gradually worsening.      Past Medical History:  Diagnosis Date  . Aortic stenosis    Moderate to severe August 2015   . Atrial fibrillation (HCC)    Coumadin  . Cardiomyopathy (HCC)    LVEF 40-45% August 2015  . CHF (congestive heart failure) (HCC)   . Dyslipidemia   . Essential hypertension   . Incontinent of urine   . Osteoarthritis   . Thyroid goiter   . Type 2 diabetes mellitus [facility])     Patient Active Problem List   Diagnosis Date Noted  . Acute congestive heart failure (HCC)   . Atrial fibrillation with RVR (HCC) 11/12/2015  . Aortic stenosis, moderate 09/29/2014  . Elevated troponin 09/27/2014  . NSTEMI (non-ST elevated myocardial infarction) (HCC) 09/27/2014  . Confusion 09/27/2014  . CHF (congestive heart failure) (HCC) 09/27/2014  . Dyslipidemia 09/27/2014  . A-fib (HCC) 09/27/2014  . Hypertension 09/27/2014  .  DM (diabetes mellitus) (HCC) 09/27/2014  . Troponin level elevated     Past Surgical History:  Procedure Laterality Date  . ABDOMINAL HYSTERECTOMY    . CARDIAC CATHETERIZATION    . PACEMAKER INSERTION     St Jude CRT-P ([place])    OB History    No data available       Home Medications    Prior to Admission medications   Medication Sig Start Date End Date Taking? Authorizing Provider  alendronate (FOSAMAX) 70 MG tablet Take 70 mg by mouth once a week. Take with a full glass of water on an empty stomach.   Yes [provider]  ARTIFICIAL TEAR SOLUTION OP Place 1 drop into both eyes as needed (dry eyes).   Yes [provider]  calcitRIOL (ROCALTROL) 0.25 MCG capsule TAKE 1 CAPSULE (0.25 MCG TOTAL) BY MOUTH DAILY. 12/30/15  Yes Duke Salvia, MD  cholecalciferol (VITAMIN D) 1000 units tablet Take 1,000 Units by mouth daily.   Yes [provider]  digoxin (LANOXIN) 0.125 MG tablet Take by mouth daily.   Yes [provider]  diltiazem (CARDIZEM CD) 120 MG 24 hr capsule TAKE 1 CAPSULE BY MOUTH DAILY Patient taking differently: TAKE 240 mg CAPSULE BY MOUTH DAILY 05/27/15  Yes Duke Salvia, MD  furosemide (LASIX) 40 MG tablet Take 1 tablet (40 mg total) by mouth 2 (two) times daily. 11/15/15  Yes Dorothea Ogle, MD  glimepiride (AMARYL) 1 MG tablet Take 1  mg by mouth daily with breakfast.    Yes [provider]  lisinopril (PRINIVIL,ZESTRIL) 10 MG tablet Take 0.5 tablets (5 mg total) by mouth daily. 03/04/16  Yes Duke Salvia, MD  metoprolol succinate (TOPROL-XL) 50 MG 24 hr tablet TAKE 1 TABLET BY MOUTH 2 TIMES DAILY. TAKE WITH OR IMMEDIATELY FOLLOWING A MEAL. Patient taking differently: TAKE 50 mg TABLET BY MOUTH 2 TIMES DAILY. TAKE WITH OR IMMEDIATELY FOLLOWING A MEAL. 04/14/16  Yes Duke Salvia, MD  Nutritional Supplements (VITAMIN D BOOSTER PO) Take 1,200 mg by mouth daily.   Yes [provider]  potassium chloride  (K-DUR,KLOR-CON) 10 MEQ tablet Take 10 mEq by mouth daily.    Yes [provider]  rosuvastatin (CRESTOR) 10 MG tablet Take 10 mg by mouth daily.   Yes [provider]  warfarin (COUMADIN) 4 MG tablet Take 2-4 mg by mouth See admin instructions. Takes 4 mg on Mon., Tues., SAT., Sun. ,Fri  Take 2 mg on Wed and thursday   Yes [provider]    Family History Family History  Problem Relation Age of Onset  . Hypertension Brother     Social History Social History  Substance Use Topics  . Smoking status: Never Smoker  . Smokeless tobacco: Former Neurosurgeon    Types: Snuff    Quit date: 07/18/2000  . Alcohol use No     Allergies   Patient has no known allergies.   Review of Systems Review of Systems  Constitutional: Positive for fever. Negative for diaphoresis.  HENT: Negative for rhinorrhea and sore throat.   Eyes: Negative for redness.  Respiratory: Positive for cough and shortness of breath.   Cardiovascular: Negative for chest pain, palpitations and leg swelling.  Gastrointestinal: Negative for abdominal pain, diarrhea, nausea and vomiting.  Genitourinary: Negative for dysuria.  Musculoskeletal: Negative for back pain, myalgias and neck pain.  Skin: Negative for rash.  Neurological: Positive for weakness (generalized). Negative for syncope, light-headedness and headaches.  Psychiatric/Behavioral: Positive for sleep disturbance. The patient is not nervous/anxious.      Physical Exam Updated Vital Signs BP (!) 143/95 (BP Location: Right Arm)   Pulse 71   Temp (!) 97.3 F (36.3 C) (Oral)   Resp 16   SpO2 99%   Physical Exam  Constitutional: She appears well-developed and well-nourished.  HENT:  Head: Normocephalic and atraumatic.  Mouth/Throat: Oropharynx is clear and moist and mucous membranes are normal. Mucous membranes are not dry.  Eyes: Conjunctivae are normal.  Neck: Trachea normal and normal range of motion. Neck supple. Normal carotid  pulses and no JVD present. No muscular tenderness present. Carotid bruit is not present. No tracheal deviation present.  Cardiovascular: Normal rate, S1 normal, S2 normal and intact distal pulses.  An irregularly irregular rhythm present. Exam reveals no decreased pulses.   Murmur heard. Rate controlled.  Pulmonary/Chest: Effort normal. No respiratory distress. She has no wheezes. She has rales (mild rales at bases). She exhibits no tenderness.  Abdominal: Soft. Normal aorta and bowel sounds are normal. There is no tenderness. There is no rebound and no guarding.  Musculoskeletal: Normal range of motion. She exhibits edema (trace, symmetric to mid ankles).  Neurological: She is alert.  Skin: Skin is warm and dry. She is not diaphoretic. No cyanosis. No pallor.  Psychiatric: She has a normal mood and affect.  Nursing note and vitals reviewed.    ED Treatments / Results  Labs (all labs ordered are listed, but only abnormal  results are displayed) Labs Reviewed  BASIC METABOLIC PANEL - Abnormal; Notable for the following:       Result Value   Potassium 3.4 (*)    Glucose, Bld 288 (*)    Creatinine, Ser 1.07 (*)    GFR calc non Af Amer 45 (*)    GFR calc Af Amer 52 (*)    All other components within normal limits  BRAIN NATRIURETIC PEPTIDE - Abnormal; Notable for the following:    B Natriuretic Peptide 1,286.5 (*)    All other components within normal limits  I-STAT TROPONIN, ED - Abnormal; Notable for the following:    Troponin i, poc 0.18 (*)    All other components within normal limits  CBC  PROTIME-INR     Radiology Dg Chest 2 View  Result Date: 03/11/2017 CLINICAL DATA:  Raising in shortness of breath for several days EXAM: CHEST  2 VIEW COMPARISON:  11/11/2015 FINDINGS: Cardiac shadow is mildly enlarged. Pacing device is again seen and stable. The lungs are well aerated bilaterally. Mild interstitial changes are again noted and stable. No focal infiltrate or sizable effusion  is seen. No acute bony abnormality is noted. IMPRESSION: No active cardiopulmonary disease. Electronically Signed   By: Alcide Clever M.D.   On: 03/11/2017 16:24   ED ECG REPORT   Date: 03/11/2017  Rate: 81  Rhythm: atrial fibrillation  QRS Axis: normal  Intervals: prolonged QTc  ST/T Wave abnormalities: ST depressions laterally  Conduction Disutrbances:none  Narrative Interpretation:   Old EKG Reviewed: unchanged  I have personally reviewed the EKG tracing and agree with the computerized printout as noted.    Procedures Procedures (including critical care time)  Medications Ordered in ED Medications  furosemide (LASIX) injection 40 mg (not administered)     Initial Impression / Assessment and Plan / ED Course  I have reviewed the triage vital signs and the nursing notes.  Pertinent labs & imaging results that were available during my care of the patient were reviewed by me and considered in my medical decision making (see chart for details).     Patient seen and examined. Work-up initiated. Medications ordered.   Vital signs reviewed and are as follows: BP (!) 143/95 (BP Location: Right Arm)   Pulse 71   Temp (!) 97.3 F (36.3 C) (Oral)   Resp 16   SpO2 99%   7:13 PM Patient discussed with Dr. Hyacinth Meeker who has seen. Given clinical scenario, elevated BNP, elevated troponin (although chronic), will admit for diuresis.   7:53 PM Spoke with Dr. Selena Batten who will see.   Final Clinical Impressions(s) / ED Diagnoses   Final diagnoses:  Acute on chronic systolic congestive heart failure (HCC)  Elevated troponin  Rate controlled atrial fibrillation (HCC)   Admit for SOB.   New Prescriptions New Prescriptions   No medications on file      Desmond Dike 03/11/17 1954    Eber Hong, MD 03/12/17 [phone]

## 2017-03-11 NOTE — Progress Notes (Signed)
ANTICOAGULATION CONSULT NOTE - Initial Consult  Pharmacy Consult for warfarin Indication: atrial fibrillation  No Known Allergies  Patient Measurements: Height: 5\' 3"  (160 cm) Weight: 150 lb (68 kg) IBW/kg (Calculated) : 52.4   Vital Signs: Temp: 98.2 F (36.8 C) (08/25 2048) Temp Source: Oral (08/25 2048) BP: 161/92 (08/25 2048) Pulse Rate: 84 (08/25 2048)  Labs:  Recent Labs  03/11/17 1554  HGB 13.0  HCT 40.1  PLT 167  CREATININE 1.07*    Estimated Creatinine Clearance: 33.6 mL/min (A) (by C-G formula based on SCr of 1.07 mg/dL (H)).   Assessment: 83 YO with AFib on warfarin PTA. Home dose 4mg  daily except 2mg  on Wednesdays and Thursdays. Last dose taken 8/25 AM. Hgb 13, plts 167- no bleeding noted.  Goal of Therapy:  INR 2-3 Monitor platelets by anticoagulation protocol: Yes   Plan:  No warfarin tonight as patient already took today's dose Daily INR Follow for s/s bleeding  Nevaen Tredway D. Wren Gallaga, PharmD, BCPS Clinical Pharmacist [phone] 03/11/2017 9:42 PM

## 2017-03-11 NOTE — ED Triage Notes (Signed)
Patient went to PCP today for insomnia and sob. Family reports patient is unable to lay flat at night and has not been sleeping. Reports PCP heard fluid and sent for chest xray and examination. Patient denies sob or chest pain at this time.

## 2017-03-11 NOTE — ED Notes (Signed)
Pt provided with [place] sandwich and hot tea

## 2017-03-11 NOTE — H&P (Signed)
TRH H&P   Patient Demographics:    [name], is a 83 y.o. female  MRN: [id number]   DOB - [birth year]  Admit Date - 03/11/2017  Outpatient Primary MD for the patient is Lorenda Ishihara, MD  Referring MD/NP/PA:    Arlys John Miller/ Renne Crigler   Outpatient Specialists:   Patient coming from:  Home=> [place]=>[place]  Chief Complaint  Patient presents with  . Insomnia  . Congestive Heart Failure      HPI:    [name]  is a 83 y.o. female, type 2 diabetes, hypertension, CHF (EF45% -  50%),  Pafib () Moderate Aortic stenosis , Mild MR, moderate pulm hypertension, c/o dyspnea, x1 week. Denies fever, chills, cough, cp, palp, n/v, diarrhea, brbpr.  Lying down made the sob worse.    In ED,  CXR => negative for CHF,  BNP elevated 1,286, trop =0.18,  K=3.4, Glucose 298 , Bun/creat 18/1.07,   ED requested pt be admitted for CHF and troponin elevation.     Review of systems:    In addition to the HPI above,  No Fever-chills, No Headache, No changes with Vision or hearing, No problems swallowing food or Liquids, No Chest pain,  No Abdominal pain, No Nausea or Vommitting, Bowel movements are regular, No Blood in stool or Urine, No dysuria, No new skin rashes or bruises, No new joints pains-aches,  No new weakness, tingling, numbness in any extremity, No recent weight gain or loss, No polyuria, polydypsia or polyphagia, No significant Mental Stressors.  A full 10 point Review of Systems was done, except as stated above, all other Review of Systems were negative.   With Past History of the following :    Past Medical History:  Diagnosis Date  . Aortic stenosis    Moderate to severe August 2015   . Atrial fibrillation (HCC)    Coumadin  . Cardiomyopathy (HCC)    LVEF 40-45% August 2015  . CHF (congestive heart failure) (HCC)   . Dyslipidemia   . Essential  hypertension   . Incontinent of urine   . Osteoarthritis   . Thyroid goiter   . Type 2 diabetes mellitus (HCC)       Past Surgical History:  Procedure Laterality Date  . ABDOMINAL HYSTERECTOMY    . CARDIAC CATHETERIZATION    . 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 Smoking status: Never Smoker  . Smokeless tobacco: Former Neurosurgeon    Types: Snuff    Quit date: 07/18/2000  . Alcohol use No     Lives - at home  Mobility - walks with walker  Family History :     Family History  Problem Relation Age of Onset  . Hypertension Brother  Home Medications:   Prior to Admission medications   Medication Sig Start Date End Date Taking? Authorizing Provider  alendronate (FOSAMAX) 70 MG tablet Take 70 mg by mouth once a week. Take with a full glass of water on an empty stomach.   Yes [provider]  ARTIFICIAL TEAR SOLUTION OP Place 1 drop into both eyes as needed (dry eyes).   Yes [provider]  calcitRIOL (ROCALTROL) 0.25 MCG capsule TAKE 1 CAPSULE (0.25 MCG TOTAL) BY MOUTH DAILY. 12/30/15  Yes Duke Salvia, MD  cholecalciferol (VITAMIN D) 1000 units tablet Take 1,000 Units by mouth daily.   Yes [provider]  digoxin (LANOXIN) 0.125 MG tablet Take by mouth daily.   Yes [provider]  diltiazem (CARDIZEM CD) 120 MG 24 hr capsule TAKE 1 CAPSULE BY MOUTH DAILY Patient taking differently: TAKE 240 mg CAPSULE BY MOUTH DAILY 05/27/15  Yes Duke Salvia, MD  furosemide (LASIX) 40 MG tablet Take 1 tablet (40 mg total) by mouth 2 (two) times daily. 11/15/15  Yes Dorothea Ogle, MD  glimepiride (AMARYL) 1 MG tablet Take 1 mg by mouth daily with breakfast.    Yes [provider]  lisinopril (PRINIVIL,ZESTRIL) 10 MG tablet Take 0.5 tablets (5 mg total) by mouth daily. 03/04/16  Yes Duke Salvia, MD  metoprolol succinate (TOPROL-XL) 50 MG 24 hr tablet TAKE 1  TABLET BY MOUTH 2 TIMES DAILY. TAKE WITH OR IMMEDIATELY FOLLOWING A MEAL. Patient taking differently: TAKE 50 mg TABLET BY MOUTH 2 TIMES DAILY. TAKE WITH OR IMMEDIATELY FOLLOWING A MEAL. 04/14/16  Yes Duke Salvia, MD  Nutritional Supplements (VITAMIN D BOOSTER PO) Take 1,200 mg by mouth daily.   Yes [provider]  potassium chloride (K-DUR,KLOR-CON) 10 MEQ tablet Take 10 mEq by mouth daily.    Yes [provider]  rosuvastatin (CRESTOR) 10 MG tablet Take 10 mg by mouth daily.   Yes [provider]  warfarin (COUMADIN) 4 MG tablet Take 2-4 mg by mouth See admin instructions. Takes 4 mg on Mon., Tues., SAT., Sun. ,Fri  Take 2 mg on Wed and thursday   Yes [provider]     Allergies:    No Known Allergies   Physical Exam:   Vitals  Blood pressure (!) 143/95, pulse 71, temperature (!) 97.3 F (36.3 C), temperature source Oral, resp. rate 16, SpO2 99 %.   1. General  lying in bed in NAD,   2. Normal affect and insight, Not Suicidal or Homicidal, Awake Alert, Oriented X 3.  3. No F.N deficits, ALL C.Nerves Intact, Strength 5/5 all 4 extremities, Sensation intact all 4 extremities, Plantars down going.  4. Ears and Eyes appear Normal, Conjunctivae clear, PERRLA. Moist Oral Mucosa.  5. Supple Neck, No JVD, No cervical lymphadenopathy appriciated, No Carotid Bruits.  6. Symmetrical Chest wall movement, Good air movement bilaterally, Crackle right lung base, slight decrease in bs left lung base.   7. RRR, s1, s2 2/6 sem rusb,  No Gallops, Rubs or Murmurs, No Parasternal Heave.  8. Positive Bowel Sounds, Abdomen Soft, No tenderness, No organomegaly appriciated,No rebound -guarding or rigidity.  9.  No Cyanosis, Normal Skin Turgor, No Skin Rash or Bruise.  10. Good muscle tone,  joints appear normal , no effusions, Normal ROM.  11. No Palpable Lymph Nodes in Neck or Axillae     Data Review:    CBC  Recent Labs Lab 03/11/17 1554  WBC  7.3  HGB  13.0  HCT 40.1  PLT 167  MCV 92.2  MCH 29.9  MCHC 32.4  RDW 14.3   ------------------------------------------------------------------------------------------------------------------  Chemistries   Recent Labs Lab 03/11/17 1554  NA 138  K 3.4*  CL 105  CO2 24  GLUCOSE 288*  BUN 18  CREATININE 1.07*  CALCIUM 9.9   ------------------------------------------------------------------------------------------------------------------ estimated creatinine clearance is 30.1 mL/min (A) (by C-G formula based on SCr of 1.07 mg/dL (H)). ------------------------------------------------------------------------------------------------------------------ No results for input(s): TSH, T4TOTAL, T3FREE, THYROIDAB in the last 72 hours.  Invalid input(s): FREET3  Coagulation profile No results for input(s): INR, PROTIME in the last 168 hours. ------------------------------------------------------------------------------------------------------------------- No results for input(s): DDIMER in the last 72 hours. -------------------------------------------------------------------------------------------------------------------  Cardiac Enzymes No results for input(s): CKMB, TROPONINI, MYOGLOBIN in the last 168 hours.  Invalid input(s): CK ------------------------------------------------------------------------------------------------------------------    Component Value Date/Time   BNP 1,286.5 (H) 03/11/2017 1616     ---------------------------------------------------------------------------------------------------------------  Urinalysis    Component Value Date/Time   COLORURINE YELLOW 09/27/2014 1157   APPEARANCEUR CLEAR 09/27/2014 1157   LABSPEC 1.012 09/27/2014 1157   PHURINE 6.5 09/27/2014 1157   GLUCOSEU NEGATIVE 09/27/2014 1157   HGBUR TRACE (A) 09/27/2014 1157   BILIRUBINUR NEGATIVE 09/27/2014 1157   KETONESUR NEGATIVE 09/27/2014 1157   PROTEINUR NEGATIVE 09/27/2014  1157   UROBILINOGEN 0.2 09/27/2014 1157   NITRITE NEGATIVE 09/27/2014 1157   LEUKOCYTESUR NEGATIVE 09/27/2014 1157    ----------------------------------------------------------------------------------------------------------------   Imaging Results:    Dg Chest 2 View  Result Date: 03/11/2017 CLINICAL DATA:  Raising in shortness of breath for several days EXAM: CHEST  2 VIEW COMPARISON:  11/11/2015 FINDINGS: Cardiac shadow is mildly enlarged. Pacing device is again seen and stable. The lungs are well aerated bilaterally. Mild interstitial changes are again noted and stable. No focal infiltrate or sizable effusion is seen. No acute bony abnormality is noted. IMPRESSION: No active cardiopulmonary disease. Electronically Signed   By: Alcide Clever M.D.   On: 03/11/2017 16:24      Assessment & Plan:    Principal Problem:   CHF (congestive heart failure) (HCC) Active Problems:   Elevated troponin   A-fib (HCC)   DM (diabetes mellitus) (HCC)   Aortic stenosis, moderate    Dyspnea Secondary to CHF vs AS Check tsh Check echo  Agree with lasix 40mg  iv x1,  Start lasix 40mg  iv bid Cardiology consult requested by email  Troponin elevation Check trop I q6hx3 Check echo  Hypokalemia Replete,  Check cmp in am  Dm2 fsbs ac and qhs, iss iss  Pafib Coumadin pharmacy to dose Cont digoxin, cardizem, and metoprolol     DVT Prophylaxis coumadin, pharmacy to dose  AM Labs Ordered, also please review Full Orders  Family Communication: Admission, patients condition and plan of care including tests being ordered have been discussed with the patient who indicate understanding and agree with the plan and Code Status.  Code Status FULL CODE  Likely DC to  home  Condition GUARDED    Consults called:  Cardiology   Admission status: observation  Time spent in minutes : 45 minutes   Pearson Grippe M.D on 03/11/2017 at 8:19 PM  Between 7am to 7pm - Pager - [phone] . After 7pm  go to [email or web address] - password [facility]  [facility] - Office  [phone]

## 2017-03-11 NOTE — ED Provider Notes (Signed)
The patient is a known CHF patient, she has seen Dr. Graciela Husbands in the past with cardiology, most recently several months ago. She presents to the hospital today with a complaint of increased shortness of breath, dyspnea on exertion, wheezing with any minimal exertion and some orthopnea. The family member state that she is getting up a couple times a night to try to get some extra breaths because she feels short of breath. She denies any significant swelling of the legs and has had no fevers or significant amounts of coughing.  On exam the patient has no significant peripheral edema, it is scant and symmetrical. She has lung sounds which are minimally tachypneic, there is no rales or wheezing at this time. The patient does not have any obvious JVD   EKG reviewed, labs reviewed, troponin reviewed, the patient has had mildly elevated troponins the last several times that is been measured. It is slightly elevated today compared to prior however I suspect this is more CHF than it is acute coronary syndromes or ischemia. This could be ischemia driven CHF as well. She will need overnight observation, Lasix will be given.  Given elevated troponin and BNP and increased DOE on this 83 y/o debilitated patient, admission would be recommended for some diuresis and further evaluation.  Vitals:   03/11/17 1531 03/11/17 2048  BP: (!) 143/95 (!) 161/92  Pulse: 71 84  Resp: 16 18  Temp: (!) 97.3 F (36.3 C) 98.2 F (36.8 C)  TempSrc: Oral Oral  SpO2: 99% 99%  Weight:  68 kg (150 lb)  Height:  5\' 3"  (1.6 m)    EKG Interpretation  Date/Time:  Saturday March 11 2017 17:49:58 EDT Ventricular Rate:  81 PR Interval:    QRS Duration: 100 QT Interval:  446 QTC Calculation: 518 R Axis:   2 Text Interpretation:  Atrial fibrillation Repol abnrm suggests ischemia, diffuse leads Prolonged QT interval since last tracing no significant change Confirmed by Eber Hong ([id number]) on 03/11/2017 7:14:51 PM      Medical  screening examination/treatment/procedure(s) were conducted as a shared visit with non-physician practitioner(s) and myself.  I personally evaluated the patient during the encounter.  Clinical Impression:   Final diagnoses:  Acute on chronic systolic congestive heart failure (HCC)  Elevated troponin  Rate controlled atrial fibrillation (HCC)         Eber Hong, MD 03/12/17 0004

## 2017-03-12 ENCOUNTER — Observation Stay (HOSPITAL_COMMUNITY): Payer: Medicare Other

## 2017-03-12 DIAGNOSIS — I5023 Acute on chronic systolic (congestive) heart failure: Secondary | ICD-10-CM | POA: Diagnosis not present

## 2017-03-12 DIAGNOSIS — Z79899 Other long term (current) drug therapy: Secondary | ICD-10-CM | POA: Diagnosis not present

## 2017-03-12 DIAGNOSIS — E118 Type 2 diabetes mellitus with unspecified complications: Secondary | ICD-10-CM | POA: Diagnosis not present

## 2017-03-12 DIAGNOSIS — I350 Nonrheumatic aortic (valve) stenosis: Secondary | ICD-10-CM | POA: Diagnosis not present

## 2017-03-12 DIAGNOSIS — E1165 Type 2 diabetes mellitus with hyperglycemia: Secondary | ICD-10-CM | POA: Diagnosis not present

## 2017-03-12 DIAGNOSIS — I5021 Acute systolic (congestive) heart failure: Secondary | ICD-10-CM | POA: Diagnosis not present

## 2017-03-12 DIAGNOSIS — I472 Ventricular tachycardia: Secondary | ICD-10-CM | POA: Diagnosis not present

## 2017-03-12 DIAGNOSIS — I248 Other forms of acute ischemic heart disease: Secondary | ICD-10-CM | POA: Diagnosis present

## 2017-03-12 DIAGNOSIS — N179 Acute kidney failure, unspecified: Secondary | ICD-10-CM | POA: Diagnosis not present

## 2017-03-12 DIAGNOSIS — I482 Chronic atrial fibrillation: Secondary | ICD-10-CM | POA: Diagnosis not present

## 2017-03-12 DIAGNOSIS — E1122 Type 2 diabetes mellitus with diabetic chronic kidney disease: Secondary | ICD-10-CM | POA: Diagnosis present

## 2017-03-12 DIAGNOSIS — G4700 Insomnia, unspecified: Secondary | ICD-10-CM | POA: Diagnosis present

## 2017-03-12 DIAGNOSIS — I272 Pulmonary hypertension, unspecified: Secondary | ICD-10-CM | POA: Diagnosis present

## 2017-03-12 DIAGNOSIS — I351 Nonrheumatic aortic (valve) insufficiency: Secondary | ICD-10-CM | POA: Diagnosis not present

## 2017-03-12 DIAGNOSIS — I5041 Acute combined systolic (congestive) and diastolic (congestive) heart failure: Secondary | ICD-10-CM | POA: Diagnosis not present

## 2017-03-12 DIAGNOSIS — R748 Abnormal levels of other serum enzymes: Secondary | ICD-10-CM

## 2017-03-12 DIAGNOSIS — E440 Moderate protein-calorie malnutrition: Secondary | ICD-10-CM | POA: Diagnosis present

## 2017-03-12 DIAGNOSIS — Z45018 Encounter for adjustment and management of other part of cardiac pacemaker: Secondary | ICD-10-CM | POA: Diagnosis not present

## 2017-03-12 DIAGNOSIS — M1991 Primary osteoarthritis, unspecified site: Secondary | ICD-10-CM | POA: Diagnosis present

## 2017-03-12 DIAGNOSIS — I513 Intracardiac thrombosis, not elsewhere classified: Secondary | ICD-10-CM | POA: Diagnosis not present

## 2017-03-12 DIAGNOSIS — Z6824 Body mass index (BMI) 24.0-24.9, adult: Secondary | ICD-10-CM | POA: Diagnosis not present

## 2017-03-12 DIAGNOSIS — I429 Cardiomyopathy, unspecified: Secondary | ICD-10-CM | POA: Diagnosis present

## 2017-03-12 DIAGNOSIS — I130 Hypertensive heart and chronic kidney disease with heart failure and stage 1 through stage 4 chronic kidney disease, or unspecified chronic kidney disease: Secondary | ICD-10-CM | POA: Diagnosis present

## 2017-03-12 DIAGNOSIS — G9340 Encephalopathy, unspecified: Secondary | ICD-10-CM | POA: Diagnosis not present

## 2017-03-12 DIAGNOSIS — N183 Chronic kidney disease, stage 3 (moderate): Secondary | ICD-10-CM | POA: Diagnosis not present

## 2017-03-12 DIAGNOSIS — I5043 Acute on chronic combined systolic (congestive) and diastolic (congestive) heart failure: Secondary | ICD-10-CM | POA: Diagnosis not present

## 2017-03-12 DIAGNOSIS — E876 Hypokalemia: Secondary | ICD-10-CM | POA: Diagnosis not present

## 2017-03-12 DIAGNOSIS — Z01810 Encounter for preprocedural cardiovascular examination: Secondary | ICD-10-CM | POA: Diagnosis not present

## 2017-03-12 DIAGNOSIS — I10 Essential (primary) hypertension: Secondary | ICD-10-CM | POA: Diagnosis not present

## 2017-03-12 DIAGNOSIS — I700 Atherosclerosis of aorta: Secondary | ICD-10-CM | POA: Diagnosis not present

## 2017-03-12 DIAGNOSIS — E785 Hyperlipidemia, unspecified: Secondary | ICD-10-CM | POA: Diagnosis present

## 2017-03-12 DIAGNOSIS — I4891 Unspecified atrial fibrillation: Secondary | ICD-10-CM | POA: Diagnosis not present

## 2017-03-12 DIAGNOSIS — Z950 Presence of cardiac pacemaker: Secondary | ICD-10-CM | POA: Diagnosis not present

## 2017-03-12 DIAGNOSIS — R410 Disorientation, unspecified: Secondary | ICD-10-CM | POA: Diagnosis not present

## 2017-03-12 DIAGNOSIS — Z7901 Long term (current) use of anticoagulants: Secondary | ICD-10-CM | POA: Diagnosis not present

## 2017-03-12 DIAGNOSIS — G9341 Metabolic encephalopathy: Secondary | ICD-10-CM | POA: Diagnosis not present

## 2017-03-12 LAB — COMPREHENSIVE METABOLIC PANEL
ALT: 25 U/L (ref 14–54)
AST: 19 U/L (ref 15–41)
Albumin: 3.3 g/dL — ABNORMAL LOW (ref 3.5–5.0)
Alkaline Phosphatase: 59 U/L (ref 38–126)
Anion gap: 7 (ref 5–15)
BUN: 16 mg/dL (ref 6–20)
CHLORIDE: 111 mmol/L (ref 101–111)
CO2: 27 mmol/L (ref 22–32)
CREATININE: 1.01 mg/dL — AB (ref 0.44–1.00)
Calcium: 10 mg/dL (ref 8.9–10.3)
GFR calc Af Amer: 56 mL/min — ABNORMAL LOW (ref >60)
GFR, EST NON AFRICAN AMERICAN: 48 mL/min — AB (ref >60)
Glucose, Bld: 161 mg/dL — ABNORMAL HIGH (ref 65–99)
Potassium: 3.3 mmol/L — ABNORMAL LOW (ref 3.5–5.1)
SODIUM: 145 mmol/L (ref 135–145)
Total Bilirubin: 1.2 mg/dL (ref 0.3–1.2)
Total Protein: 6.6 g/dL (ref 6.5–8.1)

## 2017-03-12 LAB — MAGNESIUM: Magnesium: 2.2 mg/dL (ref 1.7–2.4)

## 2017-03-12 LAB — VITAMIN B12: Vitamin B-12: 303 pg/mL (ref 180–914)

## 2017-03-12 LAB — CBC
HCT: 39.2 % (ref 36.0–46.0)
Hemoglobin: 12.5 g/dL (ref 12.0–15.0)
MCH: 29.8 pg (ref 26.0–34.0)
MCHC: 31.9 g/dL (ref 30.0–36.0)
MCV: 93.6 fL (ref 78.0–100.0)
PLATELETS: 169 10*3/uL (ref 150–400)
RBC: 4.19 MIL/uL (ref 3.87–5.11)
RDW: 14.8 % (ref 11.5–15.5)
WBC: 6.1 10*3/uL (ref 4.0–10.5)

## 2017-03-12 LAB — PROTIME-INR
INR: 1.77
Prothrombin Time: 20.9 seconds — ABNORMAL HIGH (ref 11.4–15.2)

## 2017-03-12 LAB — GLUCOSE, CAPILLARY
GLUCOSE-CAPILLARY: 203 mg/dL — AB (ref 65–99)
Glucose-Capillary: 152 mg/dL — ABNORMAL HIGH (ref 65–99)
Glucose-Capillary: 180 mg/dL — ABNORMAL HIGH (ref 65–99)
Glucose-Capillary: 163 mg/dL — ABNORMAL HIGH (ref 65–99)

## 2017-03-12 LAB — AMMONIA: Ammonia: 29 umol/L (ref 9–35)

## 2017-03-12 LAB — TROPONIN I
Troponin I: 0.12 ng/mL (ref <0.03)
Troponin I: 0.12 ng/mL (ref <0.03)

## 2017-03-12 LAB — TSH: TSH: 1.21 u[IU]/mL (ref 0.350–4.500)

## 2017-03-12 MED ORDER — QUETIAPINE FUMARATE 25 MG PO TABS
25.0000 mg | ORAL_TABLET | Freq: Every day | ORAL | Status: DC
  Administered 2017-03-13: 25 mg via ORAL
  Filled 2017-03-12 (×2): qty 1

## 2017-03-12 MED ORDER — LORAZEPAM 2 MG/ML IJ SOLN: 0.2500 mg | Freq: Three times a day (TID) | INTRAMUSCULAR | Status: DC | PRN

## 2017-03-12 MED ORDER — LORAZEPAM 2 MG/ML IJ SOLN
INTRAMUSCULAR | Status: AC
  Administered 2017-03-12: 0.5 mg via INTRAVENOUS
  Filled 2017-03-12: qty 1

## 2017-03-12 MED ORDER — LORAZEPAM 2 MG/ML IJ SOLN
0.5000 mg | Freq: Three times a day (TID) | INTRAMUSCULAR | Status: DC | PRN
  Administered 2017-03-12: 0.5 mg via INTRAVENOUS

## 2017-03-12 MED ORDER — CYANOCOBALAMIN 1000 MCG/ML IJ SOLN
1000.0000 ug | Freq: Every day | INTRAMUSCULAR | Status: DC
  Administered 2017-03-12: 1000 ug via INTRAMUSCULAR
  Filled 2017-03-12 (×4): qty 1

## 2017-03-12 MED ORDER — POTASSIUM CHLORIDE CRYS ER 20 MEQ PO TBCR
40.0000 meq | EXTENDED_RELEASE_TABLET | Freq: Every day | ORAL | Status: DC
  Administered 2017-03-12: 40 meq via ORAL
  Filled 2017-03-12: qty 2

## 2017-03-12 MED ORDER — WARFARIN SODIUM 3 MG PO TABS
6.0000 mg | ORAL_TABLET | Freq: Once | ORAL | Status: DC
  Filled 2017-03-12: qty 2

## 2017-03-12 NOTE — Progress Notes (Signed)
ANTICOAGULATION CONSULT NOTE  Pharmacy Consult:  Coumadin Indication: atrial fibrillation  No Known Allergies  Patient Measurements: Height: 5\' 3"  (160 cm) Weight: 149 lb 7.6 oz (67.8 kg) IBW/kg (Calculated) : 52.4  Vital Signs: Temp: 97.9 F (36.6 C) (08/26 0540) Temp Source: Oral (08/26 0540) BP: 160/72 (08/26 0540) Pulse Rate: 84 (08/26 0540)  Labs:  Recent Labs  03/11/17 1554 03/11/17 2143 03/12/17 0258  HGB 13.0  --  12.5  HCT 40.1  --  39.2  PLT 167  --  169  LABPROT  --  20.7* 20.9*  INR  --  1.76 1.77  CREATININE 1.07*  --  1.01*  TROPONINI  --  0.10* 0.12*    Estimated Creatinine Clearance: 35.6 mL/min (A) (by C-G formula based on SCr of 1.01 mg/dL (H)).   Assessment: 29 YOF continues on Coumadin from PTA for history of Afib.  INR sub-therapeutic; no bleeding reported.  Home Coumadin dose:  4mg  daily except 2mg  on Wed and Thurs   Goal of Therapy:  INR 2-3 Monitor platelets by anticoagulation protocol: Yes    Plan:  Coumadin 6mg  PO today Daily PT / INR   Abdou Stocks D. Laney Potash, PharmD, BCPS Pager:  [phone] 03/12/2017, 8:59 AM

## 2017-03-12 NOTE — Progress Notes (Signed)
PROGRESS NOTE    [name]  WUJ:[id number] DOB: 1927-02-05 DOA: 03/11/2017 PCP: Lorenda Ishihara, MD   Brief Narrative:  [name]  is a 83 y.o. female, type 2 diabetes, hypertension, CHF (EF45% -  50%),  Pafib () Moderate Aortic stenosis , Mild MR, moderate pulm hypertension, c/o dyspnea, x1 week. Denied fever, chills, cough, cp, palp, n/v, diarrhea, brbpr.  Lying down made the sob worse.    In ED,  CXR => negative for CHF,  BNP elevated 1,286, trop =0.18,  K=3.4, Glucose 298 , Bun/creat 18/1.07,   ED requested pt be admitted for CHF and troponin elevation.     Assessment & Plan:   Principal Problem:   CHF (congestive heart failure) (HCC) Active Problems:   Acute encephalopathy   Elevated troponin   A-fib (HCC)   DM (diabetes mellitus) (HCC)   Aortic stenosis, moderate   Hypokalemia  #1 acute on chronic systolic heart failure Patient presenting with worsening shortness of breath, elevated BNP of 1200, chest x-ray consistent with volume overload. Cardiac enzymes obtained were elevated and seems to be plateauing. 2-D echo from 2016 with a EF of 45-50% with moderate aortic valvular stenosis, mild mitral valvular regurgitation, severely dilated left atrium, mildly dilated right atrium. 2-D echo has been ordered and is pending. Patient on IV Lasix with a urine output of 1.1 L over the past 24 hours. Continue Cardizem, IV Lasix, lisinopril, Toprol-XL, Coumadin. Cardiology following and appreciate input and recommendations.  #2 moderate aortic valvular stenosis Repeat 2-D echo pending.  #3 elevated troponin Likely secondary to demand ischemia versus secondary to acute on chronic CHF exacerbation. 2-D echo pending. Continue current regimen of diuretics, Cardizem, lisinopril, Toprol-XL, Coumadin. Cardiology following.  #4 acute encephalopathy Patient agitated and screaming in the room. Patient refusing for me to examine her or interview her. Concerned that patient might  be sundowning versus dementia. CT head from 09/27/2014 with moderate cortical volume loss and scattered small vessel ischemic microangiography. Chronic infarct within left cerebellar hemisphere. Chronic lacunar a VAC at the left basal ganglia. Will check a RPR, RBC folate, B-12, ammonia level, HIV, CT head, UA with cultures and sensitivities. Chest x-ray negative for any acute infiltrate. Ativan when necessary agitation. Seroquel 25 mg by mouth daily at bedtime.  #5 hypokalemia Secondary to diureses. Repleted.  #6 diabetes mellitus Hemoglobin A1c 9.1. CBGs have ranged from 152-203. Continue sliding scale insulin.  #7 chronic kidney disease  stable.  #8 chronic atrial fibrillation CHA2DS2VASC score 6/tachybradycardia status post PPM Being followed by electrophysiology. Continue Cardizem and Toprol-XL for rate control. Coumadin for anticoagulation.  #9 hypertension Continue Cardizem, lisinopril, Toprol-XL, diuretics. Follow.    DVT prophylaxis: Coumadin Code Status: Full Family Communication: Updated family at bedside. Disposition Plan: Home once mentation has improved, CHF exacerbation resolved, and per cardiology.   Consultants:   Cardiology: Dr Gala Romney 03/12/2017  Procedures:   Chest x-ray 03/11/2017  CT head 03/12/2017 pending  Antimicrobials:   None   Subjective: Patient screaming and yelling in the room patient agitated patient refusing for me to speak with her or even examine her screaming for me to get out of the room.  Objective: Vitals:   03/11/17 2048 03/12/17 0000 03/12/17 0540 03/12/17 1205  BP: (!) 161/92 95/64 (!) 160/72 (!) 147/97  Pulse: 84 80 84 94  Resp: 18 18 18 20   Temp: 98.2 F (36.8 C) 97.7 F (36.5 C) 97.9 F (36.6 C)   TempSrc: Oral Oral Oral   SpO2: 99% 93%  96% 96%  Weight: 68 kg (150 lb)  67.8 kg (149 lb 7.6 oz)   Height: 5\' 3"  (1.6 m)       Intake/Output Summary (Last 24 hours) at 03/12/17 1746 Last data filed at 03/12/17 0858   Gross per 24 hour  Intake              360 ml  Output             1100 ml  Net             -740 ml   Filed Weights   03/11/17 2048 03/12/17 0540  Weight: 68 kg (150 lb) 67.8 kg (149 lb 7.6 oz)    Examination:  General exam: Patient agitated and screaming.  Respiratory system: Patient refused Cardiovascular system:  patient refused  Gastrointestinal system: patient refused  Central nervous system: patient refused  Extremities: patient refused  Skin: patient refused Psychiatry:  patient refused     Data Reviewed: I have personally reviewed following labs and imaging studies  CBC:  Recent Labs Lab 03/11/17 1554 03/12/17 0258  WBC 7.3 6.1  HGB 13.0 12.5  HCT 40.1 39.2  MCV 92.2 93.6  PLT 167 169   Basic Metabolic Panel:  Recent Labs Lab 03/11/17 1554 03/12/17 0258 03/12/17 1412  NA 138 145  --   K 3.4* 3.3*  --   CL 105 111  --   CO2 24 27  --   GLUCOSE 288* 161*  --   BUN 18 16  --   CREATININE 1.07* 1.01*  --   CALCIUM 9.9 10.0  --   MG  --   --  2.2   GFR: Estimated Creatinine Clearance: 35.6 mL/min (A) (by C-G formula based on SCr of 1.01 mg/dL (H)). Liver Function Tests:  Recent Labs Lab 03/12/17 0258  AST 19  ALT 25  ALKPHOS 59  BILITOT 1.2  PROT 6.6  ALBUMIN 3.3*   No results for input(s): LIPASE, AMYLASE in the last 168 hours.  Recent Labs Lab 03/12/17 1412  AMMONIA 29   Coagulation Profile:  Recent Labs Lab 03/11/17 2143 03/12/17 0258  INR 1.76 1.77   Cardiac Enzymes:  Recent Labs Lab 03/11/17 2143 03/12/17 0258 03/12/17 0925  TROPONINI 0.10* 0.12* 0.12*   BNP (last 3 results) No results for input(s): PROBNP in the last 8760 hours. HbA1C:  Recent Labs  03/11/17 2143  HGBA1C 9.1*   CBG:  Recent Labs Lab 03/11/17 2333 03/12/17 0735 03/12/17 1123 03/12/17 1631  GLUCAP 307* 152* 180* 203*   Lipid Profile: No results for input(s): CHOL, HDL, LDLCALC, TRIG, CHOLHDL, LDLDIRECT in the last 72 hours. Thyroid  Function Tests:  Recent Labs  03/12/17 0258  TSH 1.210   Anemia Panel:  Recent Labs  03/12/17 1412  VITAMINB12 303   Sepsis Labs: No results for input(s): PROCALCITON, LATICACIDVEN in the last 168 hours.  No results found for this or any previous visit (from the past 240 hour(s)).       Radiology Studies: Dg Chest 2 View  Result Date: 03/11/2017 CLINICAL DATA:  Raising in shortness of breath for several days EXAM: CHEST  2 VIEW COMPARISON:  11/11/2015 FINDINGS: Cardiac shadow is mildly enlarged. Pacing device is again seen and stable. The lungs are well aerated bilaterally. Mild interstitial changes are again noted and stable. No focal infiltrate or sizable effusion is seen. No acute bony abnormality is noted. IMPRESSION: No active cardiopulmonary disease. Electronically Signed   By: Loraine Leriche  Lukens M.D.   On: 03/11/2017 16:24   Ct Head Wo Contrast  Result Date: 03/12/2017 CLINICAL DATA:  83 year old female with new onset confusion. EXAM: CT HEAD WITHOUT CONTRAST TECHNIQUE: Contiguous axial images were obtained from the base of the skull through the vertex without intravenous contrast. COMPARISON:  Head CTs without contrast 09/27/2014 and earlier. FINDINGS: Brain: No midline shift, mass effect, or evidence of intracranial mass lesion. No acute intracranial hemorrhage identified. No ventriculomegaly. Chronic lacunar type infarcts in the left caudate, the posterior limb of the left external capsule and left superior cerebellum are stable since 2016. Patchy white matter hypodensity elsewhere has not definitely changed. No new cortical encephalomalacia or acute cortically based infarct identified. Vascular: Calcified atherosclerosis at the skull base. No suspicious intracranial vascular hyperdensity. Skull: Motion artifact at the skullbase. No acute osseous abnormality identified. Sinuses/Orbits: Visualized paranasal sinuses and mastoids are stable and well pneumatized. Other: No acute orbit  or scalp soft tissue findings. IMPRESSION: 1.  No acute intracranial abnormality. 2. Chronic small and medium-sized vessel ischemia appears not significantly progressed since 2016. Electronically Signed   By: Odessa Fleming M.D.   On: 03/12/2017 16:57        Scheduled Meds: . calcitRIOL  0.25 mcg Oral Daily  . cholecalciferol  1,000 Units Oral Daily  . cyanocobalamin  1,000 mcg Intramuscular Daily  . diltiazem  120 mg Oral Daily  . furosemide  40 mg Intravenous BID  . insulin aspart  0-5 Units Subcutaneous QHS  . insulin aspart  0-9 Units Subcutaneous TID WC  . lisinopril  5 mg Oral Daily  . metoprolol succinate  50 mg Oral BID  . potassium chloride  40 mEq Oral Daily  . QUEtiapine  25 mg Oral QHS  . rosuvastatin  10 mg Oral q1800  . sodium chloride flush  3 mL Intravenous Q12H  . warfarin  6 mg Oral ONCE-1800  . Warfarin - Pharmacist Dosing Inpatient   Does not apply q1800   Continuous Infusions: . sodium chloride       LOS: 0 days    Time spent: 30 mins    THOMPSON,DANIEL, MD [facility] Pager [phone]  If 7PM-7AM, please contact night-coverage [email or web address] Password [facility] 03/12/2017, 5:46 PM

## 2017-03-12 NOTE — Consult Note (Signed)
Cardiology Consultation:   Patient ID: [name]; [id number]; [birth year]   Admit date: 03/11/2017 Date of Consult: 03/12/2017  Primary Care Provider: Lorenda Ishihara, MD Primary Cardiologist/EP: Dr. Graciela Husbands   Patient Profile:   [name] is a 83 y.o. female with a hx of NICM (EF 45-50%), DM, HLD, moderate aortic stenosis, CKD, permanent atrial fibrillation on coumadin, tachy/brady s/p PPM who is being seen today for the evaluation of CHF at the request of Dr. Selena Batten.  History of Present Illness:   Per review of records, [name] was admitted to [facility] in 02/2014. Echo at that time showed EF 35-45% and moderate-severe aortic stenosis. Cardiac catheterization demonstrated no CAD and right heart catheterization suggested the aortic stenosis with only mild-moderate. She apparently also had a history of symptomatic bradycardia and underwent St Jude CRT-P placement.   She subsequently established care with Dr. Graciela Husbands and has done relatively well since that time.   She was admitted 4/26-4/28/17 for acute on chronic systolic CHF. Echo 09/29/2014 demonstrated EF 45-50%, moderate AS, LA severely dilated, PA pressure 52. She was diuresed and continued with rate control of afib. Discharge weight 152 lbs. Digoxin was discontinued and she was discharged on 40mg  Lasix BID.   She was in her usual state of health until the past week when she developed worsening SOB, DOE, wheezing and orthopnea. She denies LE edema, fevers, chills or productive cough. Her family brought her to the [facility] ED where was found to have mildly elevated troponin and BNP and admitted for IV lasix and overnight observation.   She is currently feeling much better and sitting up in a chair with her two daughters. She is mildly confused today which is not typical for her, per her daughters. She denies CP, LE edema, dizziness or syncope. No blood in stool or urine. No palpitations.      Past Medical History:    Diagnosis Date  . Aortic stenosis    Moderate to severe August 2015   . Atrial fibrillation (HCC)    Coumadin  . Cardiomyopathy (HCC)    LVEF 40-45% August 2015  . CHF (congestive heart failure) (HCC)   . Dyslipidemia   . Essential hypertension   . Incontinent of urine   . Osteoarthritis   . Thyroid goiter   . Type 2 diabetes mellitus (HCC)     Past Surgical History:  Procedure Laterality Date  . ABDOMINAL HYSTERECTOMY    . CARDIAC CATHETERIZATION    . PACEMAKER INSERTION     St Jude CRT-P ([place])     Inpatient Medications: Scheduled Meds: . calcitRIOL  0.25 mcg Oral Daily  . cholecalciferol  1,000 Units Oral Daily  . diltiazem  120 mg Oral Daily  . furosemide  40 mg Intravenous BID  . insulin aspart  0-5 Units Subcutaneous QHS  . insulin aspart  0-9 Units Subcutaneous TID WC  . lisinopril  5 mg Oral Daily  . metoprolol succinate  50 mg Oral BID  . potassium chloride  10 mEq Oral Daily  . rosuvastatin  10 mg Oral q1800  . sodium chloride flush  3 mL Intravenous Q12H  . Warfarin - Pharmacist Dosing Inpatient   Does not apply q1800   Continuous Infusions: . sodium chloride     PRN Meds: sodium chloride, acetaminophen **OR** acetaminophen, hydrALAZINE, sodium chloride flush  Allergies:   No Known Allergies  Social History:   Social History   Social History  . Marital status: Widowed  Spouse name: N/A  . Number of children: N/A  . Years of education: N/A   Occupational History  . Not on file.   Social History Main Topics  . Smoking status: Never Smoker  . Smokeless tobacco: Former Neurosurgeon    Types: Snuff    Quit date: 07/18/2000  . Alcohol use No  . Drug use: No  . Sexual activity: Not on file   Other Topics Concern  . Not on file   Social History Narrative  . No narrative on file    Family History:   The patient's family history includes Hypertension in her brother.  ROS:  Please see the history of present illness.  Review of Systems   Cardiovascular: Negative for palpitations ().    All other ROS reviewed and negative.     Physical Exam/Data:   Vitals:   03/11/17 1531 03/11/17 2048 03/12/17 0000 03/12/17 0540  BP: (!) 143/95 (!) 161/92 95/64 (!) 160/72  Pulse: 71 84 80 84  Resp: 16 18 18 18   Temp: (!) 97.3 F (36.3 C) 98.2 F (36.8 C) 97.7 F (36.5 C) 97.9 F (36.6 C)  TempSrc: Oral Oral Oral Oral  SpO2: 99% 99% 93% 96%  Weight:  150 lb (68 kg)  149 lb 7.6 oz (67.8 kg)  Height:  5\' 3"  (1.6 m)      Intake/Output Summary (Last 24 hours) at 03/12/17 0801 Last data filed at 03/12/17 0541  Gross per 24 hour  Intake              120 ml  Output             1100 ml  Net             -980 ml   Filed Weights   03/11/17 2048 03/12/17 0540  Weight: 150 lb (68 kg) 149 lb 7.6 oz (67.8 kg)   Body mass index is 26.48 kg/m.  General:  Well nourished, well developed, in no acute distress, mildly confused  HEENT: normal Lymph: no adenopathy Neck: no JVD Endocrine:  No thryomegaly Vascular: No carotid bruits; FA pulses 2+ bilaterally without bruits  Cardiac:  normal S1, S2; irreg irreg; 2/6 SEM with audible S2, mildly elevated JVD +8  Lungs: mild crackles at bases Abd: soft, nontender, no hepatomegaly  Ext: no edema Musculoskeletal:  No deformities, BUE and BLE strength normal and equal Skin: warm and dry  Neuro:  CNs 2-12 intact, no focal abnormalities noted Psych:  Normal affect   EKG:  The EKG was personally reviewed and demonstrates:  Telemetry:  Telemetry was personally reviewed and demonstrates: chronic atrial fib   Relevant CV Studies: 2D ECHO: 09/29/2014 LV EF: 45% - 50% Study Conclusions - Left ventricle: The cavity size was normal. Wall thickness was increased in a pattern of moderate LVH. Systolic function was mildly reduced. The estimated ejection fraction was in the range of 45% to 50%. - Aortic valve: There was moderate stenosis. Valve area (VTI): 0.75 cm^2. Valve area (Vmax): 0.73  cm^2. Valve area (Vmean): 0.73 cm^2. - Mitral valve: There was mild regurgitation. - Left atrium: The atrium was severely dilated. - Right atrium: The atrium was mildly dilated. - Pulmonary arteries: Systolic pressure was moderately increased. PA peak pressure: 52 mm Hg (S).  Laboratory Data:  Chemistry Recent Labs Lab 03/11/17 1554 03/12/17 0258  NA 138 145  K 3.4* 3.3*  CL 105 111  CO2 24 27  GLUCOSE 288* 161*  BUN 18 16  CREATININE 1.07* 1.01*  CALCIUM 9.9 10.0  GFRNONAA 45* 48*  GFRAA 52* 56*  ANIONGAP 9 7     Recent Labs Lab 03/12/17 0258  PROT 6.6  ALBUMIN 3.3*  AST 19  ALT 25  ALKPHOS 59  BILITOT 1.2   Hematology Recent Labs Lab 03/11/17 1554 03/12/17 0258  WBC 7.3 6.1  RBC 4.35 4.19  HGB 13.0 12.5  HCT 40.1 39.2  MCV 92.2 93.6  MCH 29.9 29.8  MCHC 32.4 31.9  RDW 14.3 14.8  PLT 167 169   Cardiac Enzymes Recent Labs Lab 03/11/17 2143 03/12/17 0258  TROPONINI 0.10* 0.12*    Recent Labs Lab 03/11/17 1638  TROPIPOC 0.18*    BNP Recent Labs Lab 03/11/17 1616  BNP 1,286.5*    DDimer No results for input(s): DDIMER in the last 168 hours.  Radiology/Studies:  Dg Chest 2 View  Result Date: 03/11/2017 CLINICAL DATA:  Raising in shortness of breath for several days EXAM: CHEST  2 VIEW COMPARISON:  11/11/2015 FINDINGS: Cardiac shadow is mildly enlarged. Pacing device is again seen and stable. The lungs are well aerated bilaterally. Mild interstitial changes are again noted and stable. No focal infiltrate or sizable effusion is seen. No acute bony abnormality is noted. IMPRESSION: No active cardiopulmonary disease. Electronically Signed   By: Alcide Clever M.D.   On: 03/11/2017 16:24    Assessment and Plan:   Acute on chronic systolic CHF: BNP 1200 and CXR with possible CHF. Last EF 45-50% with moderate AS in 09/2016. Repeat 2D ECHO pending. She is net neg after two doses of IV Lasix 40mg . She is due to another dose tonight. Would  continue this. Plan to repeat echo to reassess AoV. Continue Lisinopril 5mg  daily and Toprol XL 50mg  BID.   Moderate AS: repeat 2D ECHO pending  Elevated troponin: low and flat c/w demand ischemia in the setting of acute CHF   HTN: BP mildly elevated today. Continue to monitor.    CKD: creat has remained stable with diuresis. Continue to monitor   Tachy/brady s/p PPM: continue follow up with Dr. Graciela Husbands  Chronic atrial fibrillation: continue coumadin for CHADS2VASC of 6. INR subtheraputic. Rate well controlled on home BB and CCB  Acute delirium: family says this is unusual. ? sundowning   Signed, Cline Crock, PA-C  03/12/2017 8:01 AM   Patient seen and examined with Carlean Jews, PA-C. We discussed all aspects of the encounter. I agree with the assessment and plan as stated above.  83 y/o woman with EF 45-50% and h/o moderate AS admitted with increasing dyspnea and fatigue. Likely mild volume overload with elevated BNP. Now improved with diuresis. She is quite confused on exam. Wil lcontinue IV diuresis one more day. Repeat echo. Troponin is mildly elevated and flat. Likely due to HF and not ACS.  We will follow.  Arvilla Meres, MD  10:32 AM

## 2017-03-12 NOTE — Progress Notes (Signed)
Troponin 0.10, no s/s. MD notified, will continue to monitor, Thanks Lavonda Jumbo RN

## 2017-03-13 ENCOUNTER — Inpatient Hospital Stay (HOSPITAL_COMMUNITY): Payer: Medicare Other

## 2017-03-13 ENCOUNTER — Encounter (HOSPITAL_COMMUNITY): Payer: Self-pay | Admitting: Physician Assistant

## 2017-03-13 DIAGNOSIS — I351 Nonrheumatic aortic (valve) insufficiency: Secondary | ICD-10-CM

## 2017-03-13 DIAGNOSIS — I5043 Acute on chronic combined systolic (congestive) and diastolic (congestive) heart failure: Secondary | ICD-10-CM

## 2017-03-13 DIAGNOSIS — I4891 Unspecified atrial fibrillation: Secondary | ICD-10-CM

## 2017-03-13 LAB — BASIC METABOLIC PANEL
Anion gap: 11 (ref 5–15)
BUN: 15 mg/dL (ref 6–20)
CALCIUM: 10.1 mg/dL (ref 8.9–10.3)
CO2: 25 mmol/L (ref 22–32)
Chloride: 110 mmol/L (ref 101–111)
Creatinine, Ser: 1.06 mg/dL — ABNORMAL HIGH (ref 0.44–1.00)
GFR calc Af Amer: 53 mL/min — ABNORMAL LOW (ref >60)
GFR, EST NON AFRICAN AMERICAN: 45 mL/min — AB (ref >60)
GLUCOSE: 164 mg/dL — AB (ref 65–99)
Potassium: 3.7 mmol/L (ref 3.5–5.1)
SODIUM: 146 mmol/L — AB (ref 135–145)

## 2017-03-13 LAB — GLUCOSE, CAPILLARY
GLUCOSE-CAPILLARY: 192 mg/dL — AB (ref 65–99)
GLUCOSE-CAPILLARY: 257 mg/dL — AB (ref 65–99)
GLUCOSE-CAPILLARY: 231 mg/dL — AB (ref 65–99)
Glucose-Capillary: 185 mg/dL — ABNORMAL HIGH (ref 65–99)

## 2017-03-13 LAB — URINALYSIS, ROUTINE W REFLEX MICROSCOPIC
Bilirubin Urine: NEGATIVE
GLUCOSE, UA: NEGATIVE mg/dL
HGB URINE DIPSTICK: NEGATIVE
KETONES UR: NEGATIVE mg/dL
Leukocytes, UA: NEGATIVE
NITRITE: NEGATIVE
PH: 7 (ref 5.0–8.0)
PROTEIN: 30 mg/dL — AB
Specific Gravity, Urine: 1.008 (ref 1.005–1.030)

## 2017-03-13 LAB — FOLATE RBC
FOLATE, RBC: 909 ng/mL (ref >498)
Folate, Hemolysate: 362.7 ng/mL
HEMATOCRIT: 39.9 % (ref 34.0–46.6)

## 2017-03-13 LAB — CBC
HCT: 40.4 % (ref 36.0–46.0)
Hemoglobin: 12.9 g/dL (ref 12.0–15.0)
MCH: 30.2 pg (ref 26.0–34.0)
MCHC: 31.9 g/dL (ref 30.0–36.0)
MCV: 94.6 fL (ref 78.0–100.0)
PLATELETS: 178 10*3/uL (ref 150–400)
RBC: 4.27 MIL/uL (ref 3.87–5.11)
RDW: 14.7 % (ref 11.5–15.5)
WBC: 6.4 10*3/uL (ref 4.0–10.5)

## 2017-03-13 LAB — ECHOCARDIOGRAM COMPLETE
HEIGHTINCHES: 63 in
WEIGHTICAEL: 2360 [oz_av]

## 2017-03-13 LAB — RPR: RPR Ser Ql: NONREACTIVE

## 2017-03-13 LAB — HIV ANTIBODY (ROUTINE TESTING W REFLEX): HIV Screen 4th Generation wRfx: NONREACTIVE

## 2017-03-13 LAB — PROTIME-INR
INR: 1.63
PROTHROMBIN TIME: 19.6 s — AB (ref 11.4–15.2)

## 2017-03-13 MED ORDER — POTASSIUM CHLORIDE 20 MEQ PO PACK
40.0000 meq | PACK | Freq: Every day | ORAL | Status: DC
  Administered 2017-03-13 – 2017-03-18 (×6): 40 meq via ORAL
  Filled 2017-03-13 (×7): qty 2

## 2017-03-13 MED ORDER — FUROSEMIDE 40 MG PO TABS
40.0000 mg | ORAL_TABLET | Freq: Two times a day (BID) | ORAL | Status: DC
  Administered 2017-03-13 – 2017-03-17 (×8): 40 mg via ORAL
  Filled 2017-03-13 (×9): qty 1

## 2017-03-13 MED ORDER — WARFARIN SODIUM 3 MG PO TABS: 6.0000 mg | ORAL_TABLET | Freq: Once | ORAL | Status: DC

## 2017-03-13 MED ORDER — GLUCERNA SHAKE PO LIQD
237.0000 mL | Freq: Two times a day (BID) | ORAL | Status: DC
  Administered 2017-03-13 – 2017-03-21 (×13): 237 mL via ORAL
  Filled 2017-03-13 (×6): qty 237

## 2017-03-13 NOTE — Consult Note (Signed)
Inpatient TAVR Consultation:   Patient ID: [name]; [id number]; January 14, 1927   Admit date: 03/11/2017 Date of Consult: 03/13/2017  Primary Care Provider: Lorenda Ishihara, MD Primary Cardiologist: Dr. Graciela Husbands    Patient Profile:   [name] is a 83 y.o. female with a hx of NICM, DMT2, HLD, aortic stenosis, CKD, permanent atrial fibrillation on coumadin, CVA by imaging, and tachy/brady s/p CRT-P who is being seen today for the evaluation of severe aortic stenosis at the request of Dr. Royann Shivers.  History of Present Illness:   [name] lives in [place] with her two daughters. She walks with a walker. Per her daughter, she is very active and folds laundry and colors. She does not cook and clean for herself but her daughter reports that it is not because she unable to, but because her daughters take care of it for her. She was able to walk out to the mailbox with her walker with no issues until the past 2-3 weeks because of worsening dyspnea on exertion. Her daughter denies significant dementia. She is generally oriented and very engaged. She has no native teeth and wears dentures. She denies any nickel allergy.   Per review of records, Ms. Bovey was admitted to [facility] in 02/2014. Echo at that time showed EF 35-45% and moderate-severe aortic stenosis. Cardiac catheterization demonstrated no CAD and right heart catheterization suggested the aortic stenosis with only mild-moderate. She apparently also had a history of symptomatic bradycardia and underwent St Jude CRT-P placement.   She subsequently established care with Dr. Graciela Husbands and has done relatively well since that time.   She was admitted 4/26-4/28/17 for acute on chronic systolic CHF. Echo 09/29/2014 demonstrated EF 45-50%, moderate AS, LA severely dilated, PA pressure 52. She was diuresed and continued with rate control of afib.   She was in her usual state of health until the past week when she developed  worsening SOB, DOE, wheezing and orthopnea. She has also had progressive dyspnea on exertion which has limited her normal activities. She was having trouble sleeping because of orthopnea and SOB. She denies LE edema, fevers, chills or productive cough. Her family brought her to the [facility] ED where was found to have mildly elevated troponin with flat trend and BNP ~1200 and admitted for IV lasix and observation.   She is net negative 1200 mL and weight down 3lbs on IV lasix 40mg  BID. Tele with rate controlled afib and short periods of RVR with a few short runs of NSVT. 2D ECHO today showed EF 35-40%, severe AS (mean gradient 43, peak gradient 75, valve area 0.45), mild MR, mod TR, mod biatrial enlargement, mild RV dilation, mod pulm HTN (PA pressure 47)).   Of note, she was slightly disorientated at the time of initial cardiology consultation that was felt possibly related to sundowning. Per IM notes, the patient was agitated and screaming at points during admission. CT head from 09/27/2014 showed moderate cortical volume loss and scattered small vessel ischemic microangiography, chronic infarct within left cerebellar hemisphere and chronic lacunar a VAC at the left basal ganglia. CT head this admission with no acute abnormalities and old ischemic changes stable from previous scan. She was treated with Seroquel and ativan with improvement in agitation.   Today she is feeling much better in terms of her breathing. She denies any chest pain. She was able to take a good nap today with no orthopnea or PND. No dizziness or syncope. No blood in stool or urine. No  palpitations.   Past Medical History:  Diagnosis Date  . Aortic stenosis, severe    Moderate to severe August 2015   . Atrial fibrillation, chronic (HCC)    a. on Coumadin  . Chronic systolic CHF (congestive heart failure) (HCC)   . Dyslipidemia   . Essential hypertension   . Incontinent of urine   . Osteoarthritis   . Thyroid goiter   . Type 2  diabetes mellitus (HCC)     Past Surgical History:  Procedure Laterality Date  . ABDOMINAL HYSTERECTOMY    . CARDIAC CATHETERIZATION    . PACEMAKER INSERTION     St Jude CRT-P ([place])     Inpatient Medications: Scheduled Meds: . calcitRIOL  0.25 mcg Oral Daily  . cholecalciferol  1,000 Units Oral Daily  . cyanocobalamin  1,000 mcg Intramuscular Daily  . diltiazem  120 mg Oral Daily  . feeding supplement (GLUCERNA SHAKE)  237 mL Oral BID BM  . furosemide  40 mg Intravenous BID  . insulin aspart  0-5 Units Subcutaneous QHS  . insulin aspart  0-9 Units Subcutaneous TID WC  . lisinopril  5 mg Oral Daily  . metoprolol succinate  50 mg Oral BID  . potassium chloride  40 mEq Oral Daily  . QUEtiapine  25 mg Oral QHS  . rosuvastatin  10 mg Oral q1800  . sodium chloride flush  3 mL Intravenous Q12H  . Warfarin - Pharmacist Dosing Inpatient   Does not apply q1800   Continuous Infusions: . sodium chloride     PRN Meds: sodium chloride, acetaminophen **OR** acetaminophen, hydrALAZINE, LORazepam, sodium chloride flush  Allergies:   No Known Allergies  Social History:   Social History   Social History  . Marital status: Widowed    Spouse name: N/A  . Number of children: N/A  . Years of education: N/A   Occupational History  . Not on file.   Social History Main Topics  . Smoking status: Never Smoker  . Smokeless tobacco: Former Neurosurgeon    Types: Snuff    Quit date: 07/18/2000  . Alcohol use No  . Drug use: No  . Sexual activity: Not on file   Other Topics Concern  . Not on file   Social History Narrative  . No narrative on file    Family History:   The patient's family history includes Hypertension in her brother.  ROS:  Please see the history of present illness.  ROS  All other ROS reviewed and negative.     Physical Exam/Data:   Vitals:   03/13/17 0502 03/13/17 0700 03/13/17 0828 03/13/17 1120  BP: (!) 130/94  119/82 131/85  Pulse: (!) 103  93 96  Resp:  18  18   Temp: (!) 97.4 F (36.3 C)  97.9 F (36.6 C)   TempSrc: Axillary  Axillary   SpO2: 100%  96%   Weight:  147 lb 8 oz (66.9 kg)    Height:        Intake/Output Summary (Last 24 hours) at 03/13/17 1638 Last data filed at 03/13/17 1350  Gross per 24 hour  Intake              120 ml  Output              600 ml  Net             -480 ml   Filed Weights   03/11/17 2048 03/12/17 0540 03/13/17 0700  Weight: 150 lb (  68 kg) 149 lb 7.6 oz (67.8 kg) 147 lb 8 oz (66.9 kg)   Body mass index is 26.13 kg/m.  General:  Well nourished, well developed, in no acute distress, elderly and frail  HEENT: normal Lymph: no adenopathy Neck: no JVD Endocrine:  No thryomegaly Vascular: No carotid bruits; FA pulses 2+ bilaterally without bruits  Cardiac:  normal S1, S2; irreg irreg; 2/6 SEM @ RUSB Lungs:  Decreased lung sounds at bases. Clear upper lung fields  Abd: soft, nontender, no hepatomegaly  Ext: no edema Musculoskeletal:  No deformities, BUE and BLE strength normal and equal Skin: warm and dry  Neuro:  CNs 2-12 intact, no focal abnormalities noted Psych:  Normal affect   EKG:  The EKG was personally reviewed and demonstrates: afib with subtle ST segment depression with biphasic T waves in leads 2, aVF, V5-V6, unchanged from previous tracings Telemetry:  Telemetry was personally reviewed and demonstrates:  Chronic atrial fibrillation with some RVR and short runs of NSVT  Relevant CV Studies: 2D ECHO: 03/13/2017 Study Conclusions - Left ventricle: The cavity size was normal. There was severe   concentric hypertrophy. Systolic function was moderately reduced.   The estimated ejection fraction was in the range of 35% to 40%.   Wall motion was normal; there were no regional wall motion   abnormalities. The study was not technically sufficient to allow   evaluation of LV diastolic dysfunction due to atrial   fibrillation. - Aortic valve: Valve mobility was restricted. Mean gradient  (S):   43 mm Hg. Peak gradient (S): 75 mm Hg. Valve area (VTI): 0.45   cm^2. Valve area (Vmax): 0.46 cm^2. Valve area (Vmean): 0.42   cm^2. - Mitral valve: Calcified annulus. Mildly thickened leaflets .   There was mild regurgitation. - Left atrium: The atrium was moderately dilated. - Right ventricle: The cavity size was mildly dilated. Wall   thickness was normal. Systolic function was mildly reduced. - Right atrium: The atrium was moderately dilated. - Tricuspid valve: There was moderate regurgitation. - Pulmonary arteries: Systolic pressure was moderately increased.   PA peak pressure: 47 mm Hg (S). Impressions: - When compared to the prior study from 09/29/2014 LVEF has   decreased from 45-50% to 35-40% and aortic stenosis is now   severe. Moderate pulmonary hypertension.  L/RHC will be done this admission  Laboratory Data:  Chemistry  Recent Labs Lab 03/11/17 1554 03/12/17 0258 03/13/17 0434  NA 138 145 146*  K 3.4* 3.3* 3.7  CL 105 111 110  CO2 24 27 25   GLUCOSE 288* 161* 164*  BUN 18 16 15   CREATININE 1.07* 1.01* 1.06*  CALCIUM 9.9 10.0 10.1  GFRNONAA 45* 48* 45*  GFRAA 52* 56* 53*  ANIONGAP 9 7 11      Recent Labs Lab 03/12/17 0258  PROT 6.6  ALBUMIN 3.3*  AST 19  ALT 25  ALKPHOS 59  BILITOT 1.2   Hematology  Recent Labs Lab 03/11/17 1554 03/12/17 0258 03/13/17 0434  WBC 7.3 6.1 6.4  RBC 4.35 4.19 4.27  HGB 13.0 12.5 12.9  HCT 40.1 39.2 40.4  MCV 92.2 93.6 94.6  MCH 29.9 29.8 30.2  MCHC 32.4 31.9 31.9  RDW 14.3 14.8 14.7  PLT 167 169 178   Cardiac Enzymes  Recent Labs Lab 03/11/17 2143 03/12/17 0258 03/12/17 0925  TROPONINI 0.10* 0.12* 0.12*     Recent Labs Lab 03/11/17 1638  TROPIPOC 0.18*    BNP  Recent Labs Lab 03/11/17 1616  BNP 1,286.5*    DDimer No results for input(s): DDIMER in the last 168 hours.  Radiology/Studies:  Dg Chest 2 View  Result Date: 03/11/2017 CLINICAL DATA:  Raising in shortness of breath  for several days EXAM: CHEST  2 VIEW COMPARISON:  11/11/2015 FINDINGS: Cardiac shadow is mildly enlarged. Pacing device is again seen and stable. The lungs are well aerated bilaterally. Mild interstitial changes are again noted and stable. No focal infiltrate or sizable effusion is seen. No acute bony abnormality is noted. IMPRESSION: No active cardiopulmonary disease. Electronically Signed   By: Alcide Clever M.D.   On: 03/11/2017 16:24   Ct Head Wo Contrast  Result Date: 03/12/2017 CLINICAL DATA:  83 year old female with new onset confusion. EXAM: CT HEAD WITHOUT CONTRAST TECHNIQUE: Contiguous axial images were obtained from the base of the skull through the vertex without intravenous contrast. COMPARISON:  Head CTs without contrast 09/27/2014 and earlier. FINDINGS: Brain: No midline shift, mass effect, or evidence of intracranial mass lesion. No acute intracranial hemorrhage identified. No ventriculomegaly. Chronic lacunar type infarcts in the left caudate, the posterior limb of the left external capsule and left superior cerebellum are stable since 2016. Patchy white matter hypodensity elsewhere has not definitely changed. No new cortical encephalomalacia or acute cortically based infarct identified. Vascular: Calcified atherosclerosis at the skull base. No suspicious intracranial vascular hyperdensity. Skull: Motion artifact at the skullbase. No acute osseous abnormality identified. Sinuses/Orbits: Visualized paranasal sinuses and mastoids are stable and well pneumatized. Other: No acute orbit or scalp soft tissue findings. IMPRESSION: 1.  No acute intracranial abnormality. 2. Chronic small and medium-sized vessel ischemia appears not significantly progressed since 2016. Electronically Signed   By: Odessa Fleming M.D.   On: 03/12/2017 16:57     STS Risk Calculator: Procedure: AV Replacement  Risk of Mortality: 6.704%  Morbidity or Mortality: 35.865%  Long Length of Stay: 21.406%  Short Length of Stay:  8.142%  Permanent Stroke: 3.357%  Prolonged Ventilation: 25.597%  DSW Infection: 0.257%  Renal Failure: 13.476%  Reoperation: 11.709%    Assessment and Plan:   YETUNDE LEIS is a 83 y.o. female with symptoms of severe, stage D1 aortic stenosis with NYHA Class II-III symptoms. I have personally reviewed the patient's recent echocardiogram which is notable for depressed LV systolic function (EF 35-40%) and severe aortic stenosis with peak gradient of 75 mmHg and mean transvalvular gradient of 43 mmHg. The patient's dimensionless index is 0.14 and calculated aortic valve area is 0.45 cm.   I have reviewed the natural history of aortic stenosis with the patient. We have discussed the limitations of medical therapy and the poor prognosis associated with symptomatic aortic stenosis. We have reviewed potential treatment options, including palliative medical therapy, conventional surgical aortic valve replacement, and transcatheter aortic valve replacement. We discussed treatment options in the context of this patient's specific comorbid medical conditions.   The patient's predicted risk of mortality with conventional aortic valve replacement is 6.704% primarily based on her elderly age, LV dysfunction, chronic atrial fibrillation, mild CKD and DMT2. Other significant comorbid conditions include frailty, HTN, cerebrovascular disease by imaging and HLD.   She does walk with a walker and has help with cooking/cleaning, but lives a full life at home with her daughters. Her daughters deny significant dementia at home. They are clear that they would like to pursue any treatment option that would help prolong her life and increase quality of life.   TAVR seems like a reasonable treatment option for this  patient pending formal cardiac surgical consultation. We discussed typical evaluation which will require a gated cardiac CTA and a CTA of the chest/abdomen/pelvis to evaluate both his cardiac anatomy and  peripheral vasculature. She is tentatively set up for [facility] on Wednesday 03/15/17 at 4pm but we may be able to do tomorrow afternoon, so I have made her NPO after breakfast. I have held her coumadin and switched her from IV to PO lasix. We will try to arrange as much as the pre TAVR work up as an inpatient while she is admitted.   I reviewed expectations and typical post-operative course with TAVR today.  The patient has been advised of a variety of complications that might develop including but not limited to risks of death, stroke, paravalvular leak, aortic dissection or other major vascular complications, aortic annulus rupture, device embolization, cardiac rupture or perforation, mitral regurgitation, acute myocardial infarction, arrhythmia, heart block or bradycardia requiring permanent pacemaker placement, congestive heart failure, respiratory failure, renal failure, pneumonia, infection, other late complications related to structural valve deterioration or migration, or other complications that might ultimately cause a temporary or permanent loss of functional independence or other long term morbidity.  Will await further studies and cardiac surgical consultation to determine whether patient has appropriate anatomy to proceed with TAVR.  Signed, Cline Crock, PA-C  03/13/2017 4:38 PM  Patient seen, examined. Available data reviewed. Agree with findings, assessment, and plan as outlined by Carlean Jews, PA-C. On examination, the patient is an elderly, frail-appearing woman in no distress. HEENT is normal. JVP is normal. Lung fields are clear. The heart is irregularly irregular with a grade 2/6 systolic murmur best heard at the left lower sternal border and right upper sternal border. The second heart sound is audible. There is no diastolic murmur. The abdomen is soft and nontender with no organomegaly. Extremities have no edema. Femoral pulses are palpable. Radial pulses are diminished  bilaterally. The skin is warm and dry with no rash. Neurologic is grossly intact but the patient's affect is flattened. She does not provide much history. She does answer simple questions appropriately.  The patient's daughters at the bedside. We had a lengthy discussion about the patient's aortic stenosis and recurrent hospitalization for congestive heart failure. Her aortic stenosis is clearly progressed from previous echo and cardiac catheterization studies. I have personally reviewed her echo images today and her aortic valve is severely calcified with restricted mobility of all 3 leaflets. The transvalvular gradients are in the severe range with a mean gradient greater than 40 mmHg. The patient exhibits progressive dyspnea, clearly with at least New York Heart Association functional class III symptoms of chronic combined systolic and diastolic heart failure. We reviewed treatment options at length. I do not think she would be a candidate for conventional heart surgery under any circumstances. It is reasonable to consider TAVR as a way to potentially improve her morbidity and mortality. The patient's daughter who is at the bedside states that she is cognitively intact at home and has a reasonable functional capacity. She ambulates with a walker but is able to do some chores around the house. We will plan to proceed with right and left heart catheterization as the next step in her evaluation. She will have to have staging of her CT scans because of chronic kidney disease. We'll request formal surgical consultation as well.  I have reviewed the risks, indications, and alternatives to cardiac catheterization, possible angioplasty, and stenting with the patient. Risks include but are not limited  to bleeding, infection, vascular injury, stroke, myocardial infection, arrhythmia, kidney injury, radiation-related injury in the case of prolonged fluoroscopy use, emergency cardiac surgery, and death. The patient  understands the risks of serious complication is 1-2 in 1000 with diagnostic cardiac cath and 1-2% or less with angioplasty/stenting.   Tonny Bollman, M.D. 03/14/2017 2:26 PM

## 2017-03-13 NOTE — Progress Notes (Signed)
ANTICOAGULATION CONSULT NOTE  Pharmacy Consult:  Coumadin Indication: atrial fibrillation  No Known Allergies  Patient Measurements: Height: 5\' 3"  (160 cm) Weight: 147 lb 8 oz (66.9 kg) IBW/kg (Calculated) : 52.4  Vital Signs: Temp: 97.9 F (36.6 C) (08/27 0828) Temp Source: Axillary (08/27 0828) BP: 119/82 (08/27 0828) Pulse Rate: 93 (08/27 0828)  Labs:  Recent Labs  03/11/17 1554 03/11/17 2143 03/12/17 0258 03/12/17 0925 03/13/17 0434  HGB 13.0  --  12.5  --  12.9  HCT 40.1  --  39.2  --  40.4  PLT 167  --  169  --  178  LABPROT  --  20.7* 20.9*  --  19.6*  INR  --  1.76 1.77  --  1.63  CREATININE 1.07*  --  1.01*  --  1.06*  TROPONINI  --  0.10* 0.12* 0.12*  --     Estimated Creatinine Clearance: 33.7 mL/min (A) (by C-G formula based on SCr of 1.06 mg/dL (H)).   Assessment: 38 YOF continues on Coumadin from PTA for history of Afib.  INR sub-therapeutic; no bleeding reported.  Last night's dose of Coumadin not given due to patient being combative.  Home Coumadin dose:  4mg  daily except 2mg  on Wed and Thurs   Goal of Therapy:  INR 2-3 Monitor platelets by anticoagulation protocol: Yes  Plan:  Coumadin 6 mg PO today Daily PT / INR   Tad Moore, BCPS  Clinical Pharmacist Pager [phone]  03/13/2017 8:41 AM

## 2017-03-13 NOTE — Progress Notes (Signed)
Progress Note  Patient Name: [name] Date of Encounter: 03/13/2017  Primary Cardiologist: Dr. Graciela Husbands   Subjective   No complaints. Pt notes that she is feeling better. Family currently by bedside.   Inpatient Medications    Scheduled Meds: . calcitRIOL  0.25 mcg Oral Daily  . cholecalciferol  1,000 Units Oral Daily  . cyanocobalamin  1,000 mcg Intramuscular Daily  . diltiazem  120 mg Oral Daily  . furosemide  40 mg Intravenous BID  . insulin aspart  0-5 Units Subcutaneous QHS  . insulin aspart  0-9 Units Subcutaneous TID WC  . lisinopril  5 mg Oral Daily  . metoprolol succinate  50 mg Oral BID  . potassium chloride  40 mEq Oral Daily  . QUEtiapine  25 mg Oral QHS  . rosuvastatin  10 mg Oral q1800  . sodium chloride flush  3 mL Intravenous Q12H  . warfarin  6 mg Oral ONCE-1800  . Warfarin - Pharmacist Dosing Inpatient   Does not apply q1800   Continuous Infusions: . sodium chloride     PRN Meds: sodium chloride, acetaminophen **OR** acetaminophen, hydrALAZINE, LORazepam, sodium chloride flush   Vital Signs    Vitals:   03/12/17 2337 03/13/17 0502 03/13/17 0700 03/13/17 0828  BP: (!) 152/69 (!) 130/94  119/82  Pulse: 89 (!) 103  93  Resp: 18 18    Temp: 98.5 F (36.9 C) (!) 97.4 F (36.3 C)  97.9 F (36.6 C)  TempSrc: Axillary Axillary  Axillary  SpO2: 100% 100%    Weight:   147 lb 8 oz (66.9 kg)   Height:        Intake/Output Summary (Last 24 hours) at 03/13/17 0912 Last data filed at 03/13/17 8676  Gross per 24 hour  Intake                0 ml  Output              400 ml  Net             -400 ml   Filed Weights   03/11/17 2048 03/12/17 0540 03/13/17 0700  Weight: 150 lb (68 kg) 149 lb 7.6 oz (67.8 kg) 147 lb 8 oz (66.9 kg)    Telemetry    Atrial fibrillation w/ CVR, 6 beat run of NSVT on telemetry - Personally Reviewed  ECG    Atrial fibrillation 81 bpm  - Personally Reviewed  Physical Exam   GEN: No acute distress. Elderly  Neck:  No JVD Cardiac: irregularlly irregular rhythm, regular rate, 2/6 AS murmur, no rubs, or gallops.  Respiratory: bibasilar rales, upper lung fields CTA GI: Soft, nontender, non-distended  MS: No edema; No deformity. Neuro:  Nonfocal  Psych: Normal affect   Labs    Chemistry Recent Labs Lab 03/11/17 1554 03/12/17 0258 03/13/17 0434  NA 138 145 146*  K 3.4* 3.3* 3.7  CL 105 111 110  CO2 24 27 25   GLUCOSE 288* 161* 164*  BUN 18 16 15   CREATININE 1.07* 1.01* 1.06*  CALCIUM 9.9 10.0 10.1  PROT  --  6.6  --   ALBUMIN  --  3.3*  --   AST  --  19  --   ALT  --  25  --   ALKPHOS  --  59  --   BILITOT  --  1.2  --   GFRNONAA 45* 48* 45*  GFRAA 52* 56* 53*  ANIONGAP 9 7 11  Hematology Recent Labs Lab 03/11/17 1554 03/12/17 0258 03/13/17 0434  WBC 7.3 6.1 6.4  RBC 4.35 4.19 4.27  HGB 13.0 12.5 12.9  HCT 40.1 39.2 40.4  MCV 92.2 93.6 94.6  MCH 29.9 29.8 30.2  MCHC 32.4 31.9 31.9  RDW 14.3 14.8 14.7  PLT 167 169 178    Cardiac Enzymes Recent Labs Lab 03/11/17 2143 03/12/17 0258 03/12/17 0925  TROPONINI 0.10* 0.12* 0.12*    Recent Labs Lab 03/11/17 1638  TROPIPOC 0.18*     BNP Recent Labs Lab 03/11/17 1616  BNP 1,286.5*     DDimer No results for input(s): DDIMER in the last 168 hours.   Radiology    Dg Chest 2 View  Result Date: 03/11/2017 CLINICAL DATA:  Raising in shortness of breath for several days EXAM: CHEST  2 VIEW COMPARISON:  11/11/2015 FINDINGS: Cardiac shadow is mildly enlarged. Pacing device is again seen and stable. The lungs are well aerated bilaterally. Mild interstitial changes are again noted and stable. No focal infiltrate or sizable effusion is seen. No acute bony abnormality is noted. IMPRESSION: No active cardiopulmonary disease. Electronically Signed   By: Alcide Clever M.D.   On: 03/11/2017 16:24   Ct Head Wo Contrast  Result Date: 03/12/2017 CLINICAL DATA:  83 year old female with new onset confusion. EXAM: CT HEAD WITHOUT  CONTRAST TECHNIQUE: Contiguous axial images were obtained from the base of the skull through the vertex without intravenous contrast. COMPARISON:  Head CTs without contrast 09/27/2014 and earlier. FINDINGS: Brain: No midline shift, mass effect, or evidence of intracranial mass lesion. No acute intracranial hemorrhage identified. No ventriculomegaly. Chronic lacunar type infarcts in the left caudate, the posterior limb of the left external capsule and left superior cerebellum are stable since 2016. Patchy white matter hypodensity elsewhere has not definitely changed. No new cortical encephalomalacia or acute cortically based infarct identified. Vascular: Calcified atherosclerosis at the skull base. No suspicious intracranial vascular hyperdensity. Skull: Motion artifact at the skullbase. No acute osseous abnormality identified. Sinuses/Orbits: Visualized paranasal sinuses and mastoids are stable and well pneumatized. Other: No acute orbit or scalp soft tissue findings. IMPRESSION: 1.  No acute intracranial abnormality. 2. Chronic small and medium-sized vessel ischemia appears not significantly progressed since 2016. Electronically Signed   By: Odessa Fleming M.D.   On: 03/12/2017 16:57    Cardiac Studies   2D echo pending   Patient Profile     83 y.o. female with a hx of NICM (EF 45-50%), DM, HLD, moderate aortic stenosis, CKD, permanent atrial fibrillation on coumadin, tachy/brady s/p PPM admitted for a/c systolic HF.    Assessment & Plan    1. Acute on Chronic Systolic CHF: Last known EF assessment was in 2016 by echo. EF was 45-50%. Repeat echo pending. Pt presented with increasing dyspnea and fatigue, in the setting of elevated BNP at 1,286. IV lasix, 40 mg BID, initiated. Total UOP yesterday was only documented at 400 mL. Net I/Os -1.1 L. She notes subjective improvement. No peripheral edema, however bibasilar rales on exam. Renal function, K and BP stable. Continue IV lasix. Monitor UOP, BP, renal  function and electrolytes. Continue BB and ACE/I for LV dysfunction. Low sodium diet.   2. Moderate AS: last 2D echo was in 2016. Mean gradient at that time was 25 mm Hg. F/u echo pending.   3. Elevated Troponin: flat low level trend, 0.10>>0.12>>0.12. Likely demand ischemia 2/2 acute on chronic CHF. She denies CP.  4. HTN: controlled on current regimen. Monitor  closely while diuresing. Avoid hypotension given AS.   5. CKD: SCr is stable. 1.06 today. Monitor closely while diuresing.   6. Chronic Atrial Fibrillation: rate is controlled with metoprolol and Cardizem. She is on coumadin for a/c. INR is subtherapeutic at 1.63 today. Pharmacy assisting with monitoring/dosing.   7. Tachy/Brady Syndrome s/p PPM: followed by Dr. Graciela Husbands. [address] [facility] CRT-P   Signed, Robbie Lis, PA-C  03/13/2017, 9:12 AM    I have seen and examined the patient along with Robbie Lis, PA-C .  I have reviewed the chart, notes and new data.  I agree with PA's note.  Key new complaints: Feels that her breathing has improved Key examination changes: Irregular rhythm, no significant leg edema Key new findings / data:  - Minor elevation in cardiac troponin a plateau pattern is not consistent with true acute coronary syndrome.  Creatinine stable at 1.0. BNP at 1286 was higher than recorded during previous episode of heart failure exacerbation in April 2017. Lowest recorded BNP was 970, but I'm not sure if this was a true baseline.  - ECG shows atrial fibrillation with controlled rate, narrow QRS complex, subtle ST segment depression with biphasic T waves in leads 2, aVF, V5-V6, but this is unchanged compared to March 2018. -Last CRT-P device check is from March. Her device is programmed VVI 50 bpm with RV pacing only (at that time 7%). The left ventricular lead is not being used. - Most recent assessment of LV function from March 2016 showed EF 45-50%; mean aortic valve gradient at that time was 25 mmHg, consistent with  moderate aortic stenosis, should be reevaluated.  PLAN: Recheck echo for evidence of progression of aortic stenosis. Since she is in permanent atrial fibrillation and has narrow QRS complex during native AV conduction, it is preferable to avoid pacing. Recheck her device while inpatient. Watch for hypotension or evidence of low cardiac output in case she has progressive severe aortic stenosis. Warfarin dose adjusted for subtherapeutic anticoagulation.  Thurmon Fair, MD, [facility] [phone] 03/13/2017, 10:18 AM

## 2017-03-13 NOTE — Progress Notes (Signed)
Initial Nutrition Assessment  DOCUMENTATION CODES:   Non-severe (moderate) malnutrition in context of chronic illness  INTERVENTION:   -Recommend downgrading to Dysphagia III (easy to chew) diet  -Glucerna Shake po TID, each supplement provides 220 kcal and 10 grams of protein   NUTRITION DIAGNOSIS:   Malnutrition (Moderate) related to chronic illness (CHF, CKD) as evidenced by percent weight loss, moderate depletions of muscle mass.  GOAL:   Patient will meet greater than or equal to 90% of their needs  MONITOR:   PO intake, Supplement acceptance, Labs, Weight trends  REASON FOR ASSESSMENT:   Malnutrition Screening Tool    ASSESSMENT:    83 yo female admitted with acute on chronic CHF. Pt with hx of HTN, CHF, CKD, DM  Recorded po intake 85-100% of meals. Family reports pt appetite has been down over the past month, pt not eating nearly as much as she used to  Pt is edentulous, dentures are being made but pt does not have yet. Difficulty chewing tough meats, raw fruits and veggies at this time.  Drinking Glucerna shake daily (family mixing in oatmeal at breakfast). Pt typically eating 2 good meals per day.   Family reports 8 pound wt loss in past 1 month. 5.2% wt loss which is significant for time frame.   Nutrition-Focused physical exam completed. Findings are no fat depletion, mild/moderate in all areas with severe muscle depletion in LE only, and mild edema.   Family reports pt ambulates with walker at home  Labs: sodium 148, CBGs 163-203 Meds: calcitriol, lasix  Diet Order:  DIET DYS 3 Room service appropriate? Yes; Fluid consistency: Thin  Skin:  Reviewed, no issues  Last BM:  8/24  Height:   Ht Readings from Last 1 Encounters:  03/11/17 5\' 3"  (1.6 m)    Weight:   Wt Readings from Last 1 Encounters:  03/13/17 147 lb 8 oz (66.9 kg)    Ideal Body Weight:     BMI:  Body mass index is 26.13 kg/m.  Estimated Nutritional Needs:   Kcal:  1460-1640  kcals  Protein:  73-82 g   Fluid:  >/= 1.5 L  EDUCATION NEEDS:   No education needs identified at this time  Romelle Starcher MS, RD, LDN [phone] Pager  [phone] Weekend/On-Call Pager

## 2017-03-13 NOTE — Progress Notes (Signed)
Per night shift report, pt did not receive her evening coumadin, as well as her crestor due to combativeness.

## 2017-03-13 NOTE — Progress Notes (Signed)
Pt had 6bt run of V-tach. No s/s, MD notified, will cont to monitor, Thanks Lavonda Jumbo

## 2017-03-13 NOTE — Progress Notes (Signed)
  Echocardiogram 2D Echocardiogram has been performed.  [name] 03/13/2017, 11:23 AM

## 2017-03-13 NOTE — Progress Notes (Signed)
PROGRESS NOTE    [name]  ZOX:[id number] DOB: [birth year] DOA: 03/11/2017 PCP: Lorenda Ishihara, MD   Brief Narrative:  [name]  is a 83 y.o. female, type 2 diabetes, hypertension, CHF (EF45% -  50%),  Pafib () Moderate Aortic stenosis , Mild MR, moderate pulm hypertension, c/o dyspnea, x1 week. Denied fever, chills, cough, cp, palp, n/v, diarrhea, brbpr.  Lying down made the sob worse.    In ED,  CXR => negative for CHF,  BNP elevated 1,286, trop =0.18,  K=3.4, Glucose 298 , Bun/creat 18/1.07,   ED requested pt be admitted for CHF and troponin elevation.     Assessment & Plan:   Principal Problem:   CHF (congestive heart failure) (HCC) Active Problems:   Acute encephalopathy   Elevated troponin   Dyslipidemia   Chronic atrial fibrillation (HCC)   Hypertension   DM (diabetes mellitus) (HCC)   Troponin level elevated   Severe aortic stenosis   Acute congestive heart failure (HCC)   Hypokalemia   Rate controlled atrial fibrillation (HCC)  #1 acute on chronic systolic heart failure Patient presenting with worsening shortness of breath, elevated BNP of 1200, chest x-ray consistent with volume overload. Concern is secondary to severe aortic vascular stenosis. Cardiac enzymes obtained were elevated and seems to be plateauing. 2-D echo from 2016 with a EF of 45-50% with moderate aortic valvular stenosis, mild mitral valvular regurgitation, severely dilated left atrium, mildly dilated right atrium. 2-D echo with decreased EF of 35-40% from 45-50% and severe aortic valvular stenosis, moderate pulmonary hypertension. Patient still volume overloaded on exam. Urine output noted was 400 mL over the past 24 hours however likely incorrect. Patient's weight is 147 pounds from 150 pounds on admission. Continue diuresis with IV Lasix. Continue cardiac regimen of Cardizem, IV Lasix, lisinopril, Toprol-XL, Coumadin. Cardiology following and appreciate input and recommendations.  #2  severe aortic valvular stenosis Repeat 2-D echo with worsening severe aortic valvular stenosis. Cardiology following.   #3 elevated troponin Likely secondary to demand ischemia versus secondary to acute on chronic CHF exacerbation. 2-D echo with a EF of 35-40%, no wall motion abnormalities, severe aortic stenosis. Moderate pulmonary hypertension. Cardiology following. Continue cardiac regimen of Cardizem, lisinopril, Toprol-XL, IV Lasix.  #4 acute encephalopathy Patient less agitated and not screaming today. Alert and oriented to self and place. Family convinced patient to allow me to examine and interview her. Concerned that patient might be sundowning versus dementia. CT head from 09/27/2014 with moderate cortical volume loss and scattered small vessel ischemic microangiography. Chronic infarct within left cerebellar hemisphere. Chronic lacunar a VAC at the left basal ganglia. CT head with no acute abnormalities. RBC folate 909. Vitamin B-12 levels decreased at 303. RPR nonreactive. HIV nonreactive. Ammonia levels at 29. Urinalysis negative for UTI. Continue Seroquel 25 mg daily at bedtime. Ativan when necessary.   #5 hypokalemia Secondary to diureses. Repleted.  #6 diabetes mellitus Hemoglobin A1c 9.1. CBGs have ranged from 185-257. Place on Lantus 5 units daily. Continue sliding scale insulin.  #7 chronic kidney disease  stable.  #8 chronic atrial fibrillation CHA2DS2VASC score 6/tachybradycardia status post PPM Being followed by electrophysiology in the outpatient setting. Rate controlled on metoprolol and Cardizem. INR subtherapeutic at 1.63. Coumadin for anticoagulation. Cardiology following.  #9 hypertension Continue Cardizem, lisinopril, Toprol-XL, diuretics. Follow.    DVT prophylaxis: Coumadin Code Status: Full Family Communication: Updated family at bedside. Disposition Plan: Home once mentation has improved, CHF exacerbation resolved, and per cardiology.   Consultants:  Cardiology: Dr Gala Romney 03/12/2017  Procedures:   Chest x-ray 03/11/2017  CT head 03/12/2017  2-D echo 03/13/2017  Antimicrobials:   None   Subjective: Patient sitting up in chair a little apprehensive. Patient less agitated today. Patient alert to self and place. Patient states shortness of breath has improved. No chest pain.   Objective: Vitals:   03/13/17 0502 03/13/17 0700 03/13/17 0828 03/13/17 1120  BP: (!) 130/94  119/82 131/85  Pulse: (!) 103  93 96  Resp: 18  18   Temp: (!) 97.4 F (36.3 C)  97.9 F (36.6 C)   TempSrc: Axillary  Axillary   SpO2: 100%  96%   Weight:  66.9 kg (147 lb 8 oz)    Height:        Intake/Output Summary (Last 24 hours) at 03/13/17 1832 Last data filed at 03/13/17 1350  Gross per 24 hour  Intake              120 ml  Output              600 ml  Net             -480 ml   Filed Weights   03/11/17 2048 03/12/17 0540 03/13/17 0700  Weight: 68 kg (150 lb) 67.8 kg (149 lb 7.6 oz) 66.9 kg (147 lb 8 oz)    Examination:  General exam: NAD Respiratory system: Diffusely crackles. No wheezes. Cardiovascular system:  Irregularly irregular. Grade 2-3/6 SEM. No rubs or gallops. Gastrointestinal system: Soft, nontender, nondistended, positive bowel.   Central nervous system:  patient alert and oriented 2. Less confused. Extremities:  no clubbing or cyanosis. Trace to 1+ bilateral lower extremity edema Skin: no rashes. No lesions.  Psychiatry:   judgment and insight poor. Mood and affect flat.    Data Reviewed: I have personally reviewed following labs and imaging studies  CBC:  Recent Labs Lab 03/11/17 1554 03/12/17 0258 03/12/17 1412 03/13/17 0434  WBC 7.3 6.1  --  6.4  HGB 13.0 12.5  --  12.9  HCT 40.1 39.2 39.9 40.4  MCV 92.2 93.6  --  94.6  PLT 167 169  --  178   Basic Metabolic Panel:  Recent Labs Lab 03/11/17 1554 03/12/17 0258 03/12/17 1412 03/13/17 0434  NA 138 145  --  146*  K 3.4* 3.3*  --  3.7  CL 105  111  --  110  CO2 24 27  --  25  GLUCOSE 288* 161*  --  164*  BUN 18 16  --  15  CREATININE 1.07* 1.01*  --  1.06*  CALCIUM 9.9 10.0  --  10.1  MG  --   --  2.2  --    GFR: Estimated Creatinine Clearance: 33.7 mL/min (A) (by C-G formula based on SCr of 1.06 mg/dL (H)). Liver Function Tests:  Recent Labs Lab 03/12/17 0258  AST 19  ALT 25  ALKPHOS 59  BILITOT 1.2  PROT 6.6  ALBUMIN 3.3*   No results for input(s): LIPASE, AMYLASE in the last 168 hours.  Recent Labs Lab 03/12/17 1412  AMMONIA 29   Coagulation Profile:  Recent Labs Lab 03/11/17 2143 03/12/17 0258 03/13/17 0434  INR 1.76 1.77 1.63   Cardiac Enzymes:  Recent Labs Lab 03/11/17 2143 03/12/17 0258 03/12/17 0925  TROPONINI 0.10* 0.12* 0.12*   BNP (last 3 results) No results for input(s): PROBNP in the last 8760 hours. HbA1C:  Recent Labs  03/11/17 2143  HGBA1C  9.1*   CBG:  Recent Labs Lab 03/12/17 1631 03/12/17 2110 03/13/17 0729 03/13/17 1141 03/13/17 1627  GLUCAP 203* 163* 192* 185* 257*   Lipid Profile: No results for input(s): CHOL, HDL, LDLCALC, TRIG, CHOLHDL, LDLDIRECT in the last 72 hours. Thyroid Function Tests:  Recent Labs  03/12/17 0258  TSH 1.210   Anemia Panel:  Recent Labs  03/12/17 1412  VITAMINB12 303   Sepsis Labs: No results for input(s): PROCALCITON, LATICACIDVEN in the last 168 hours.  No results found for this or any previous visit (from the past 240 hour(s)).       Radiology Studies: Ct Head Wo Contrast  Result Date: 03/12/2017 CLINICAL DATA:  83 year old female with new onset confusion. EXAM: CT HEAD WITHOUT CONTRAST TECHNIQUE: Contiguous axial images were obtained from the base of the skull through the vertex without intravenous contrast. COMPARISON:  Head CTs without contrast 09/27/2014 and earlier. FINDINGS: Brain: No midline shift, mass effect, or evidence of intracranial mass lesion. No acute intracranial hemorrhage identified. No  ventriculomegaly. Chronic lacunar type infarcts in the left caudate, the posterior limb of the left external capsule and left superior cerebellum are stable since 2016. Patchy white matter hypodensity elsewhere has not definitely changed. No new cortical encephalomalacia or acute cortically based infarct identified. Vascular: Calcified atherosclerosis at the skull base. No suspicious intracranial vascular hyperdensity. Skull: Motion artifact at the skullbase. No acute osseous abnormality identified. Sinuses/Orbits: Visualized paranasal sinuses and mastoids are stable and well pneumatized. Other: No acute orbit or scalp soft tissue findings. IMPRESSION: 1.  No acute intracranial abnormality. 2. Chronic small and medium-sized vessel ischemia appears not significantly progressed since 2016. Electronically Signed   By: Odessa Fleming M.D.   On: 03/12/2017 16:57        Scheduled Meds: . calcitRIOL  0.25 mcg Oral Daily  . cholecalciferol  1,000 Units Oral Daily  . cyanocobalamin  1,000 mcg Intramuscular Daily  . diltiazem  120 mg Oral Daily  . feeding supplement (GLUCERNA SHAKE)  237 mL Oral BID BM  . furosemide  40 mg Oral BID  . insulin aspart  0-5 Units Subcutaneous QHS  . insulin aspart  0-9 Units Subcutaneous TID WC  . lisinopril  5 mg Oral Daily  . metoprolol succinate  50 mg Oral BID  . potassium chloride  40 mEq Oral Daily  . QUEtiapine  25 mg Oral QHS  . rosuvastatin  10 mg Oral q1800  . sodium chloride flush  3 mL Intravenous Q12H  . Warfarin - Pharmacist Dosing Inpatient   Does not apply q1800   Continuous Infusions: . sodium chloride       LOS: 1 day    Time spent: 30 mins    THOMPSON,DANIEL, MD [facility] Pager [phone]  If 7PM-7AM, please contact night-coverage [email or web address] Password [facility] 03/13/2017, 6:32 PM

## 2017-03-14 ENCOUNTER — Encounter (HOSPITAL_COMMUNITY)
Admission: EM | Disposition: A | Discharge status: Home Health | Payer: Self-pay | Source: Home / Self Care | Attending: Nephrology

## 2017-03-14 ENCOUNTER — Other Ambulatory Visit: Payer: Self-pay | Admitting: *Deleted

## 2017-03-14 ENCOUNTER — Inpatient Hospital Stay (HOSPITAL_COMMUNITY): Payer: Medicare Other

## 2017-03-14 DIAGNOSIS — I350 Nonrheumatic aortic (valve) stenosis: Secondary | ICD-10-CM

## 2017-03-14 DIAGNOSIS — Z950 Presence of cardiac pacemaker: Secondary | ICD-10-CM

## 2017-03-14 DIAGNOSIS — Z01810 Encounter for preprocedural cardiovascular examination: Secondary | ICD-10-CM

## 2017-03-14 DIAGNOSIS — I5041 Acute combined systolic (congestive) and diastolic (congestive) heart failure: Secondary | ICD-10-CM

## 2017-03-14 DIAGNOSIS — N179 Acute kidney failure, unspecified: Secondary | ICD-10-CM

## 2017-03-14 DIAGNOSIS — Z45018 Encounter for adjustment and management of other part of cardiac pacemaker: Secondary | ICD-10-CM

## 2017-03-14 DIAGNOSIS — I472 Ventricular tachycardia: Secondary | ICD-10-CM

## 2017-03-14 DIAGNOSIS — N183 Chronic kidney disease, stage 3 (moderate): Secondary | ICD-10-CM

## 2017-03-14 LAB — BASIC METABOLIC PANEL
ANION GAP: 10 (ref 5–15)
BUN: 21 mg/dL — ABNORMAL HIGH (ref 6–20)
CALCIUM: 10 mg/dL (ref 8.9–10.3)
CO2: 24 mmol/L (ref 22–32)
Chloride: 108 mmol/L (ref 101–111)
Creatinine, Ser: 1.2 mg/dL — ABNORMAL HIGH (ref 0.44–1.00)
GFR, EST AFRICAN AMERICAN: 45 mL/min — AB (ref >60)
GFR, EST NON AFRICAN AMERICAN: 39 mL/min — AB (ref >60)
Glucose, Bld: 214 mg/dL — ABNORMAL HIGH (ref 65–99)
Potassium: 3.6 mmol/L (ref 3.5–5.1)
Sodium: 142 mmol/L (ref 135–145)

## 2017-03-14 LAB — URINE CULTURE

## 2017-03-14 LAB — VAS US CAROTID
LCCADDIAS: -8 cm/s
LCCAPSYS: 31 cm/s
LEFT VERTEBRAL DIAS: 8 cm/s
Left CCA dist sys: -37 cm/s
Left CCA prox dias: 7 cm/s
Left ICA dist dias: -15 cm/s
Left ICA dist sys: -62 cm/s
Left ICA prox dias: -8 cm/s
Left ICA prox sys: -23 cm/s
RCCADSYS: -23 cm/s
RCCAPDIAS: 3 cm/s
RCCAPSYS: 21 cm/s
RIGHT ECA DIAS: -5 cm/s
RIGHT VERTEBRAL DIAS: 7 cm/s

## 2017-03-14 LAB — GLUCOSE, CAPILLARY
GLUCOSE-CAPILLARY: 265 mg/dL — AB (ref 65–99)
GLUCOSE-CAPILLARY: 162 mg/dL — AB (ref 65–99)
Glucose-Capillary: 225 mg/dL — ABNORMAL HIGH (ref 65–99)
Glucose-Capillary: 218 mg/dL — ABNORMAL HIGH (ref 65–99)

## 2017-03-14 LAB — PROTIME-INR
INR: 1.35
PROTHROMBIN TIME: 16.7 s — AB (ref 11.4–15.2)

## 2017-03-14 LAB — MAGNESIUM: Magnesium: 2.2 mg/dL (ref 1.7–2.4)

## 2017-03-14 SURGERY — INVASIVE LAB ABORTED CASE

## 2017-03-14 MED ORDER — INSULIN GLARGINE 100 UNIT/ML ~~LOC~~ SOLN
5.0000 [IU] | SUBCUTANEOUS | Status: DC
  Filled 2017-03-14 (×2): qty 0.05

## 2017-03-14 MED ORDER — SODIUM CHLORIDE 0.9 % IV SOLN
INTRAVENOUS | Status: DC
  Administered 2017-03-14: 17:00:00 via INTRAVENOUS

## 2017-03-14 MED ORDER — ASPIRIN 81 MG PO CHEW
81.0000 mg | CHEWABLE_TABLET | ORAL | Status: AC
  Administered 2017-03-14: 81 mg via ORAL

## 2017-03-14 MED ORDER — VITAMIN B-12 1000 MCG PO TABS
1000.0000 ug | ORAL_TABLET | Freq: Every day | ORAL | Status: DC
  Administered 2017-03-14 – 2017-03-21 (×8): 1000 ug via ORAL
  Filled 2017-03-14 (×8): qty 1

## 2017-03-14 MED ORDER — ASPIRIN 81 MG PO CHEW
CHEWABLE_TABLET | ORAL | Status: AC
  Administered 2017-03-14: 81 mg via ORAL
  Filled 2017-03-14: qty 1

## 2017-03-14 MED ORDER — SODIUM CHLORIDE 0.9% FLUSH: 3.0000 mL | INTRAVENOUS | Status: DC | PRN

## 2017-03-14 MED ORDER — SODIUM CHLORIDE 0.9 % IV SOLN: 250.0000 mL | INTRAVENOUS | Status: DC | PRN

## 2017-03-14 MED ORDER — SODIUM CHLORIDE 0.9% FLUSH: 3.0000 mL | Freq: Two times a day (BID) | INTRAVENOUS | Status: DC

## 2017-03-14 SURGICAL SUPPLY — 7 items
KIT HEART LEFT (KITS) IMPLANT
PACK CARDIAC CATHETERIZATION (CUSTOM PROCEDURE TRAY) IMPLANT
SHEATH PINNACLE 5F 10CM (SHEATH) IMPLANT
SYR MEDRAD MARK V 150ML (SYRINGE) IMPLANT
TRANSDUCER W/STOPCOCK (MISCELLANEOUS) IMPLANT
TUBING CIL FLEX 10 FLL-RA (TUBING) IMPLANT
WIRE EMERALD 3MM-J .035X150CM (WIRE) IMPLANT

## 2017-03-14 NOTE — Progress Notes (Signed)
IV team called to place second line for pt pre-cath due to pt hx of anxiety/combativeness. No UNSUCCESSFUL attempts were made, but rather than delayed preparedness for procedure and risk triggering combative episode pre-cardiac cath IV team consult was entered. Per unit director this is a reasonable action.

## 2017-03-14 NOTE — Progress Notes (Signed)
Pt is continuously eating prior to morning CBGs, via food purchased with family. Morning CBGs are not true fasting CBGs

## 2017-03-14 NOTE — Progress Notes (Signed)
Pt returns from cath-lab without procedure completed, postponed for later date. '

## 2017-03-14 NOTE — Progress Notes (Signed)
Pt slept well during night, daughters in bed side and is updating, vitals stable, no any complain of CP and SOB, getting up and using bed side commode overnight, will continue to monitor the patient

## 2017-03-14 NOTE — Progress Notes (Signed)
Pts daughter states that the family would prefer to keep cath scheduled for 4p on Wednesday, as currently scheduled so pt is not in limp NPO and pts other children can be here; as they were not made aware of possibility of procedure today.

## 2017-03-14 NOTE — Progress Notes (Signed)
ANTICOAGULATION CONSULT NOTE  Pharmacy Consult:  Coumadin Indication: atrial fibrillation  No Known Allergies  Patient Measurements: Height: 5\' 3"  (160 cm) Weight: 151 lb (68.5 kg) IBW/kg (Calculated) : 52.4  Assessment: 89 YOF continues on Coumadin 4mg  daily exc for 2mg  on Wed / Thurs PTA for Afib. INR 1.77 on admit. INR down to 1.35 today with missed dose. ChadsVasc = 6. Now Cards holding Coumadin for tentative cath on 8/29. Hgb and plts stable.  Goal of Therapy:  INR 2-3 Monitor platelets by anticoagulation protocol: Yes  Plan:  Hold Coumadin tonight Monitor daily INR, CBC, s/s of bleed F/U heart cath  Enzo Bi, PharmD, [facility] Clinical Pharmacist Pager [phone] 03/14/2017 8:09 AM

## 2017-03-14 NOTE — Progress Notes (Signed)
Page to Team A Cards, to follow up on possible cath work in today.    No word available yet regarding potential for work in. [place] notified of pending new insulin orders, but no word on length of NPO status-    Family notified of tentative plan for possible work in and precautions regarding glycemic control and diet restrictions.

## 2017-03-14 NOTE — Progress Notes (Addendum)
PROGRESS NOTE    MANAR SMALLING  UJW:[id number] DOB: 1927/07/16 DOA: 03/11/2017 PCP: Lorenda Ishihara, MD   Brief Narrative:  [name]  is a 83 y.o. female, type 2 diabetes, hypertension, CHF (EF45% -  50%),  Pafib () Moderate Aortic stenosis , Mild MR, moderate pulm hypertension, c/o dyspnea, x1 week. Denied fever, chills, cough, cp, palp, n/v, diarrhea, brbpr.  Lying down made the sob worse.    In ED,  CXR => negative for CHF,  BNP elevated 1,286, trop =0.18,  K=3.4, Glucose 298 , Bun/creat 18/1.07,   ED requested pt be admitted for CHF and troponin elevation.    Patient was admitted and placed on IV Lasix and diuresed cardiology was consulted. 2-D echo did show severe aortic valvular stenosis. Patient been assessed for TAVR. Patient to undergo cardiac catheterization 03/14/2017. Patient also noted to be agitated very confused. Patient likely with sundowning and possibly dementia.   Assessment & Plan:   Principal Problem:   CHF (congestive heart failure) (HCC) Active Problems:   Acute encephalopathy   Elevated troponin   Dyslipidemia   Chronic atrial fibrillation (HCC)   Hypertension   DM (diabetes mellitus) (HCC)   Troponin level elevated   Severe aortic stenosis   Acute congestive heart failure (HCC)   Hypokalemia   Rate controlled atrial fibrillation (HCC)  #1 acute on chronic systolic heart failure Patient presented with worsening shortness of breath, elevated BNP of 1200, chest x-ray consistent with volume overload. Concern is heart failure secondary to severe aortic vascular stenosis. Cardiac enzymes obtained were elevated and seemed to have plateaued.  2-D echo from 2016 with a EF of 45-50% with moderate aortic valvular stenosis, mild mitral valvular regurgitation, severely dilated left atrium, mildly dilated right atrium. 2-D echo with decreased EF of 35-40% from 45-50% and severe aortic valvular stenosis, moderate pulmonary hypertension. Patient still  volume overloaded on exam. Urine output noted was over the past 24 hours. Patient is -2.590 L during this hospitalization. Current weight is 151 pounds from 147 pounds from 150 pounds on admission. IV Lasix and then transitioned to oral Lasix per cardiology in preparation for cardiac catheterization. Continue diuresis with oral Lasix. Continue cardiac regimen of Cardizem, Lasix, lisinopril, Toprol-XL, Coumadin. Cardiology following and appreciate input and recommendations.  #2 severe aortic valvular stenosis Repeat 2-D echo with worsening severe aortic valvular stenosis. Patient is been assessed by cardiology for TAVR. Patient to undergo pre-workup for TAVR. Patient for cardiac catheterization today. Patient had carotid Dopplers done. Cardiology following.   #3 elevated troponin Likely secondary to demand ischemia versus secondary to acute on chronic CHF exacerbation. 2-D echo with a EF of 35-40%, no wall motion abnormalities, severe aortic stenosis. Moderate pulmonary hypertension. Cardiology following. Continue cardiac regimen of Cardizem, lisinopril, Toprol-XL, IV Lasix. Patient has been seen by cardiology for evaluation of severe aortic stenosis and patient subsequently to undergo cardiac catheterization today for further evaluation.   #4 acute encephalopathy (clinically undetermined) Patient less agitated today however refusing physical exam. Alert and oriented to self and place. Patient likely sundowning versus dementia. CT head from 09/27/2014 with moderate cortical volume loss and scattered small vessel ischemic microangiography. Chronic infarct within left cerebellar hemisphere. Chronic lacunar a VAC at the left basal ganglia. CT head done during this hospitalization with no acute abnormalities. RBC folate 909. Vitamin B-12 levels decreased at 303. RPR nonreactive. HIV nonreactive. Ammonia levels at 29. Urinalysis negative for UTI. Continue Seroquel 25 mg daily at bedtime. Ativan when  necessary.  Change vitamin B 12 supplementation from IM to oral supplementation as patient refusing IM B-12 supplementation.    #5 hypokalemia Secondary to diureses. Repleted.  #6 diabetes mellitus Hemoglobin A1c 9.1. CBGs have ranged from 225-265. Lantus 5 units daily. Continue sliding scale insulin.  #7 chronic kidney disease  stable.  #8 chronic atrial fibrillation CHA2DS2VASC score 6/tachybradycardia status post PPM Being followed by electrophysiology in the outpatient setting. Rate controlled on metoprolol and Cardizem. INR subtherapeutic at 1.35. Coumadin for anticoagulation. Cardiology following.  #9 hypertension Controlled on current regimen of diltiazem, Lasix, lisinopril, Toprol-XL.   DVT prophylaxis: SCDs/Coumadin Code Status: Full Family Communication: Updated family at bedside. Disposition Plan: Home once mentation has improved, CHF exacerbation resolved, and per cardiology.   Consultants:   Cardiology: Dr Gala Romney 03/12/2017  Procedures:   Chest x-ray 03/11/2017  CT head 03/12/2017  2-D echo 03/13/2017  Carotid Dopplers 03/14/2017  Antimicrobials:   None   Subjective: Patient laying in bed. Patient states improvement of shortness of breath. No chest pain. Patient less agitated. Patient refusing physical examination.   Objective: Vitals:   03/13/17 0828 03/13/17 1120 03/13/17 2015 03/14/17 0600  BP: 119/82 131/85 110/84 140/80  Pulse: 93 96 86 88  Resp: 18  18 18   Temp: 97.9 F (36.6 C)  98 F (36.7 C) 98 F (36.7 C)  TempSrc: Axillary  Oral Oral  SpO2: 96%  98% 98%  Weight:    68.5 kg (151 lb)  Height:        Intake/Output Summary (Last 24 hours) at 03/14/17 1158 Last data filed at 03/14/17 0811  Gross per 24 hour  Intake              240 ml  Output             1270 ml  Net            -1030 ml   Filed Weights   03/12/17 0540 03/13/17 0700 03/14/17 0600  Weight: 67.8 kg (149 lb 7.6 oz) 66.9 kg (147 lb 8 oz) 68.5 kg (151 lb)     Examination:   Patient refused physical examination.  General exam: NAD Respiratory system:  Cardiovascular system:   Gastrointestinal system:  Central nervous system:   Extremities:   Skin:  Psychiatry:     Data Reviewed: I have personally reviewed following labs and imaging studies  CBC:  Recent Labs Lab 03/11/17 1554 03/12/17 0258 03/12/17 1412 03/13/17 0434  WBC 7.3 6.1  --  6.4  HGB 13.0 12.5  --  12.9  HCT 40.1 39.2 39.9 40.4  MCV 92.2 93.6  --  94.6  PLT 167 169  --  178   Basic Metabolic Panel:  Recent Labs Lab 03/11/17 1554 03/12/17 0258 03/12/17 1412 03/13/17 0434 03/14/17 0546  NA 138 145  --  146* 142  K 3.4* 3.3*  --  3.7 3.6  CL 105 111  --  110 108  CO2 24 27  --  25 24  GLUCOSE 288* 161*  --  164* 214*  BUN 18 16  --  15 21*  CREATININE 1.07* 1.01*  --  1.06* 1.20*  CALCIUM 9.9 10.0  --  10.1 10.0  MG  --   --  2.2  --  2.2   GFR: Estimated Creatinine Clearance: 30.1 mL/min (A) (by C-G formula based on SCr of 1.2 mg/dL (H)). Liver Function Tests:  Recent Labs Lab 03/12/17 0258  AST 19  ALT 25  ALKPHOS 59  BILITOT 1.2  PROT 6.6  ALBUMIN 3.3*   No results for input(s): LIPASE, AMYLASE in the last 168 hours.  Recent Labs Lab 03/12/17 1412  AMMONIA 29   Coagulation Profile:  Recent Labs Lab 03/11/17 2143 03/12/17 0258 03/13/17 0434 03/14/17 0546  INR 1.76 1.77 1.63 1.35   Cardiac Enzymes:  Recent Labs Lab 03/11/17 2143 03/12/17 0258 03/12/17 0925  TROPONINI 0.10* 0.12* 0.12*   BNP (last 3 results) No results for input(s): PROBNP in the last 8760 hours. HbA1C:  Recent Labs  03/11/17 2143  HGBA1C 9.1*   CBG:  Recent Labs Lab 03/13/17 1141 03/13/17 1627 03/13/17 2125 03/14/17 0747 03/14/17 1130  GLUCAP 185* 257* 231* 265* 225*   Lipid Profile: No results for input(s): CHOL, HDL, LDLCALC, TRIG, CHOLHDL, LDLDIRECT in the last 72 hours. Thyroid Function Tests:  Recent Labs  03/12/17 0258   TSH 1.210   Anemia Panel:  Recent Labs  03/12/17 1412  VITAMINB12 303   Sepsis Labs: No results for input(s): PROCALCITON, LATICACIDVEN in the last 168 hours.  Recent Results (from the past 240 hour(s))  Urine Culture     Status: Abnormal   Collection Time: 03/13/17  5:10 AM  Result Value Ref Range Status   Specimen Description URINE, RANDOM  Final   Special Requests NONE  Final   Culture MULTIPLE SPECIES PRESENT, SUGGEST RECOLLECTION (A)  Final   Report Status 03/14/2017 FINAL  Final         Radiology Studies: Ct Head Wo Contrast  Result Date: 03/12/2017 CLINICAL DATA:  83 year old female with new onset confusion. EXAM: CT HEAD WITHOUT CONTRAST TECHNIQUE: Contiguous axial images were obtained from the base of the skull through the vertex without intravenous contrast. COMPARISON:  Head CTs without contrast 09/27/2014 and earlier. FINDINGS: Brain: No midline shift, mass effect, or evidence of intracranial mass lesion. No acute intracranial hemorrhage identified. No ventriculomegaly. Chronic lacunar type infarcts in the left caudate, the posterior limb of the left external capsule and left superior cerebellum are stable since 2016. Patchy white matter hypodensity elsewhere has not definitely changed. No new cortical encephalomalacia or acute cortically based infarct identified. Vascular: Calcified atherosclerosis at the skull base. No suspicious intracranial vascular hyperdensity. Skull: Motion artifact at the skullbase. No acute osseous abnormality identified. Sinuses/Orbits: Visualized paranasal sinuses and mastoids are stable and well pneumatized. Other: No acute orbit or scalp soft tissue findings. IMPRESSION: 1.  No acute intracranial abnormality. 2. Chronic small and medium-sized vessel ischemia appears not significantly progressed since 2016. Electronically Signed   By: Odessa Fleming M.D.   On: 03/12/2017 16:57        Scheduled Meds: . calcitRIOL  0.25 mcg Oral Daily  .  cholecalciferol  1,000 Units Oral Daily  . diltiazem  120 mg Oral Daily  . feeding supplement (GLUCERNA SHAKE)  237 mL Oral BID BM  . furosemide  40 mg Oral BID  . insulin aspart  0-5 Units Subcutaneous QHS  . insulin aspart  0-9 Units Subcutaneous TID WC  . insulin glargine  5 Units Subcutaneous BH-q7a  . lisinopril  5 mg Oral Daily  . metoprolol succinate  50 mg Oral BID  . potassium chloride  40 mEq Oral Daily  . QUEtiapine  25 mg Oral QHS  . rosuvastatin  10 mg Oral q1800  . sodium chloride flush  3 mL Intravenous Q12H  . vitamin B-12  1,000 mcg Oral Daily  . Warfarin - Pharmacist Dosing Inpatient   Does not apply [phone]  Continuous Infusions: . sodium chloride       LOS: 2 days    Time spent: 30 mins    THOMPSON,DANIEL, MD [facility] Pager [phone]  If 7PM-7AM, please contact night-coverage [email or web address] Password [facility] 03/14/2017, 11:58 AM

## 2017-03-14 NOTE — Progress Notes (Addendum)
*  PRELIMINARY RESULTS* Vascular Ultrasound Carotid Duplex (Doppler) has been completed.   Findings suggest 1-39% internal carotid artery stenosis bilaterally. Bilateral carotid arteries exhibit low velocities, suggestive of a possible proximal obstruction. Vertebral arteries are patent with antegrade flow.  03/14/2017 2:17 PM Gertie Fey, BS, RVT, RDCS, RDMS

## 2017-03-14 NOTE — Progress Notes (Signed)
Pt brought to cath lab for right/left heart cath for evaluation of CHF and severe aortic stenosis. She is very confused, doesn't know where she is or why she is here. Even with redirection she remains confused and doesn't seem to be able to comprehend even simple instructions. Her mental status has been waxing and waning throughout the hospitalization, but I think her degree of confusion is a contraindication to elective heart catheterization. Discussed plan with her daughters who understand. Could consider cath later this hospitalization if her mental status clears up.  Tonny Bollman 03/14/2017 6:28 PM

## 2017-03-14 NOTE — Progress Notes (Signed)
Progress Note  Patient Name: [name] Date of Encounter: 03/14/2017  Primary Cardiologist: Graciela Husbands  Subjective   Patient and family just spoke with Dr. Excell Seltzer and agree to go ahead with right and left heart catheterization for aortic stenosis today. She is not very communicative and appears a little anxious. Her daughter does most of the speaking for her.  Inpatient Medications    Scheduled Meds: . calcitRIOL  0.25 mcg Oral Daily  . cholecalciferol  1,000 Units Oral Daily  . diltiazem  120 mg Oral Daily  . feeding supplement (GLUCERNA SHAKE)  237 mL Oral BID BM  . furosemide  40 mg Oral BID  . insulin aspart  0-5 Units Subcutaneous QHS  . insulin aspart  0-9 Units Subcutaneous TID WC  . insulin glargine  5 Units Subcutaneous BH-q7a  . lisinopril  5 mg Oral Daily  . metoprolol succinate  50 mg Oral BID  . potassium chloride  40 mEq Oral Daily  . QUEtiapine  25 mg Oral QHS  . rosuvastatin  10 mg Oral q1800  . sodium chloride flush  3 mL Intravenous Q12H  . vitamin B-12  1,000 mcg Oral Daily  . Warfarin - Pharmacist Dosing Inpatient   Does not apply q1800   Continuous Infusions: . sodium chloride     PRN Meds: sodium chloride, acetaminophen **OR** acetaminophen, hydrALAZINE, LORazepam, sodium chloride flush   Vital Signs    Vitals:   03/13/17 0828 03/13/17 1120 03/13/17 2015 03/14/17 0600  BP: 119/82 131/85 110/84 140/80  Pulse: 93 96 86 88  Resp: 18  18 18   Temp: 97.9 F (36.6 C)  98 F (36.7 C) 98 F (36.7 C)  TempSrc: Axillary  Oral Oral  SpO2: 96%  98% 98%  Weight:    151 lb (68.5 kg)  Height:        Intake/Output Summary (Last 24 hours) at 03/14/17 1205 Last data filed at 03/14/17 6213  Gross per 24 hour  Intake              240 ml  Output             1270 ml  Net            -1030 ml   Filed Weights   03/12/17 0540 03/13/17 0700 03/14/17 0600  Weight: 149 lb 7.6 oz (67.8 kg) 147 lb 8 oz (66.9 kg) 151 lb (68.5 kg)    Telemetry    Atrial  fibrillation with controlled ventricular response - Personally Reviewed  ECG    No new tracing - Personally Reviewed  Physical Exam  Appears elderly and frail GEN: No acute distress.   Neck: No JVD, weak bilateral carotid pulses and faint bilateral carotid bruits Cardiac:  irregular, faint second heart sound, mid peaking fairly distant aortic ejection murmur, no diastolic murmurs, rubs, or gallops.  Respiratory: Clear to auscultation bilaterally. GI: Soft, nontender, non-distended  MS: No edema; No deformity. Neuro:  Nonfocal  Psych: Normal affect   Labs    Chemistry Recent Labs Lab 03/12/17 0258 03/13/17 0434 03/14/17 0546  NA 145 146* 142  K 3.3* 3.7 3.6  CL 111 110 108  CO2 27 25 24   GLUCOSE 161* 164* 214*  BUN 16 15 21*  CREATININE 1.01* 1.06* 1.20*  CALCIUM 10.0 10.1 10.0  PROT 6.6  --   --   ALBUMIN 3.3*  --   --   AST 19  --   --   ALT 25  --   --  ALKPHOS 59  --   --   BILITOT 1.2  --   --   GFRNONAA 48* 45* 39*  GFRAA 56* 53* 45*  ANIONGAP 7 11 10      Hematology Recent Labs Lab 03/11/17 1554 03/12/17 0258 03/12/17 1412 03/13/17 0434  WBC 7.3 6.1  --  6.4  RBC 4.35 4.19  --  4.27  HGB 13.0 12.5  --  12.9  HCT 40.1 39.2 39.9 40.4  MCV 92.2 93.6  --  94.6  MCH 29.9 29.8  --  30.2  MCHC 32.4 31.9  --  31.9  RDW 14.3 14.8  --  14.7  PLT 167 169  --  178    Cardiac Enzymes Recent Labs Lab 03/11/17 2143 03/12/17 0258 03/12/17 0925  TROPONINI 0.10* 0.12* 0.12*    Recent Labs Lab 03/11/17 1638  TROPIPOC 0.18*     BNP Recent Labs Lab 03/11/17 1616  BNP 1,286.5*     DDimer No results for input(s): DDIMER in the last 168 hours.   Radiology    Ct Head Wo Contrast  Result Date: 03/12/2017 CLINICAL DATA:  83 year old female with new onset confusion. EXAM: CT HEAD WITHOUT CONTRAST TECHNIQUE: Contiguous axial images were obtained from the base of the skull through the vertex without intravenous contrast. COMPARISON:  Head CTs without  contrast 09/27/2014 and earlier. FINDINGS: Brain: No midline shift, mass effect, or evidence of intracranial mass lesion. No acute intracranial hemorrhage identified. No ventriculomegaly. Chronic lacunar type infarcts in the left caudate, the posterior limb of the left external capsule and left superior cerebellum are stable since 2016. Patchy white matter hypodensity elsewhere has not definitely changed. No new cortical encephalomalacia or acute cortically based infarct identified. Vascular: Calcified atherosclerosis at the skull base. No suspicious intracranial vascular hyperdensity. Skull: Motion artifact at the skullbase. No acute osseous abnormality identified. Sinuses/Orbits: Visualized paranasal sinuses and mastoids are stable and well pneumatized. Other: No acute orbit or scalp soft tissue findings. IMPRESSION: 1.  No acute intracranial abnormality. 2. Chronic small and medium-sized vessel ischemia appears not significantly progressed since 2016. Electronically Signed   By: Odessa Fleming M.D.   On: 03/12/2017 16:57    Cardiac Studies   - Left ventricle: The cavity size was normal. There was severe   concentric hypertrophy. Systolic function was moderately reduced.   The estimated ejection fraction was in the range of 35% to 40%.   Wall motion was normal; there were no regional wall motion   abnormalities. The study was not technically sufficient to allow   evaluation of LV diastolic dysfunction due to atrial   fibrillation. - Aortic valve: Valve mobility was restricted. Mean gradient (S):   43 mm Hg. Peak gradient (S): 75 mm Hg. Valve area (VTI): 0.45   cm^2. Valve area (Vmax): 0.46 cm^2. Valve area (Vmean): 0.42   cm^2. - Mitral valve: Calcified annulus. Mildly thickened leaflets .   There was mild regurgitation. - Left atrium: The atrium was moderately dilated. - Right ventricle: The cavity size was mildly dilated. Wall   thickness was normal. Systolic function was mildly reduced. - Right  atrium: The atrium was moderately dilated. - Tricuspid valve: There was moderate regurgitation. - Pulmonary arteries: Systolic pressure was moderately increased.   PA peak pressure: 47 mm Hg (S).  Impressions:  - When compared to the prior study from 09/29/2014 LVEF has   decreased from 45-50% to 35-40% and aortic stenosis is now   severe. Moderate pulmonary hypertension.   Patient  Profile     83 y.o. female with acute on chronic heart failure due to moderate left ventricular dysfunction and critical aortic stenosis on a background of diabetes mellitus, hyperlipidemia, permanent atrial fibrillation, pacemaker for tachycardia-bradycardia syndrome, chronic kidney disease stage III  Assessment & Plan    1. CHF: Improved hemodynamically and symptom wise. Not requiring oxygen. Net diuresis 2.3 L since admission. Doubt accuracy of weight recorded today. No change plans to current medications for heart failure until after cardiac catheterization is reviewed. This procedure has been fully reviewed with the patient and written informed consent has been obtained. 2. AS: She has critical illness with gradients somewhat underestimated due to low cardiac output, but still clearly in the severe range. Suspect the cardiomyopathy is at least partly due to aortic stenosis and is liable to improve. 3. HTN: Blood pressure desirable range, avoid potent vasodilators due to aortic stenosis. 4. AFib: Well rate controlled. Warfarin on hold for catheterization. INR today 1.35. Restart anticoagulation after cardiac cath. 5. CKD: Slight increase in creatinine not unexpected with diuresis. Hold further diuretics. Watch for contrast nephrotoxicity following cardiac cath and subsequent planned CTs of chest and abdomen/pelvis as workup for TAVR. Contrast based imaging procedures will have to be spaced out to avoid nephrotoxicity. 6. PM: She has a biventricular pacemaker in place, but the device is being operated as a  "backup" single lead right ventricular pacemaker since she has developed permanent atrial fibrillation and has narrow QRS complex during natural A-V conduction. Full pacemaker interrogation during current hospitalization shows normal device function with only 7% ventricular pacing (lower rate limit 50 bpm). Excellent ventricular lead parameters. Estimated generator longevity 11-11 0.8 years. Rare episodes of brief high ventricular rate are mostly consistent with atrial fibrillation w RVR, but at least one episode on August 26 at 12:20 5 PM is suggestive of nonsustained ventricular tachycardia at 195 bpm. It occurred on the background of a longer episodes of A. fib with RVR.  Signed, Thurmon Fair, MD  03/14/2017, 12:05 PM

## 2017-03-15 ENCOUNTER — Inpatient Hospital Stay (HOSPITAL_COMMUNITY): Payer: Medicare Other

## 2017-03-15 DIAGNOSIS — I350 Nonrheumatic aortic (valve) stenosis: Secondary | ICD-10-CM

## 2017-03-15 DIAGNOSIS — I10 Essential (primary) hypertension: Secondary | ICD-10-CM

## 2017-03-15 LAB — PULMONARY FUNCTION TEST
FEF 25-75 PRE: 0.94 L/s
FEF2575-%Pred-Pre: 94 %
FEV1-%PRED-PRE: 75 %
FEV1-Pre: 0.99 L
FEV1FVC-%PRED-PRE: 106 %
FEV6-%Pred-Pre: 77 %
FEV6-Pre: 1.25 L
FEV6FVC-%Pred-Pre: 105 %
FVC-%Pred-Pre: 74 %
FVC-Pre: 1.25 L
PRE FEV1/FVC RATIO: 79 %
Pre FEV6/FVC Ratio: 100 %

## 2017-03-15 LAB — CBC
HCT: 41.5 % (ref 36.0–46.0)
Hemoglobin: 12.9 g/dL (ref 12.0–15.0)
MCH: 29.7 pg (ref 26.0–34.0)
MCHC: 31.1 g/dL (ref 30.0–36.0)
MCV: 95.4 fL (ref 78.0–100.0)
PLATELETS: 200 10*3/uL (ref 150–400)
RBC: 4.35 MIL/uL (ref 3.87–5.11)
RDW: 14.7 % (ref 11.5–15.5)
WBC: 5.4 10*3/uL (ref 4.0–10.5)

## 2017-03-15 LAB — GLUCOSE, CAPILLARY
GLUCOSE-CAPILLARY: 216 mg/dL — AB (ref 65–99)
GLUCOSE-CAPILLARY: 272 mg/dL — AB (ref 65–99)
Glucose-Capillary: 144 mg/dL — ABNORMAL HIGH (ref 65–99)
Glucose-Capillary: 227 mg/dL — ABNORMAL HIGH (ref 65–99)

## 2017-03-15 LAB — BASIC METABOLIC PANEL
Anion gap: 10 (ref 5–15)
BUN: 14 mg/dL (ref 6–20)
CHLORIDE: 109 mmol/L (ref 101–111)
CO2: 26 mmol/L (ref 22–32)
CREATININE: 1.07 mg/dL — AB (ref 0.44–1.00)
Calcium: 10.2 mg/dL (ref 8.9–10.3)
GFR calc non Af Amer: 45 mL/min — ABNORMAL LOW (ref >60)
GFR, EST AFRICAN AMERICAN: 52 mL/min — AB (ref >60)
Glucose, Bld: 145 mg/dL — ABNORMAL HIGH (ref 65–99)
Potassium: 3.4 mmol/L — ABNORMAL LOW (ref 3.5–5.1)
SODIUM: 145 mmol/L (ref 135–145)

## 2017-03-15 LAB — MAGNESIUM: MAGNESIUM: 2.2 mg/dL (ref 1.7–2.4)

## 2017-03-15 LAB — PROTIME-INR
INR: 1.16
Prothrombin Time: 14.8 seconds (ref 11.4–15.2)

## 2017-03-15 MED ORDER — WARFARIN SODIUM 5 MG PO TABS
5.0000 mg | ORAL_TABLET | Freq: Once | ORAL | Status: AC
  Administered 2017-03-15: 5 mg via ORAL
  Filled 2017-03-15: qty 1

## 2017-03-15 MED ORDER — METOPROLOL TARTRATE 5 MG/5ML IV SOLN
2.5000 mg | Freq: Once | INTRAVENOUS | Status: AC
  Administered 2017-03-15: 2.5 mg via INTRAVENOUS

## 2017-03-15 MED ORDER — INSULIN ASPART 100 UNIT/ML ~~LOC~~ SOLN
0.0000 [IU] | Freq: Three times a day (TID) | SUBCUTANEOUS | Status: DC
Start: 2017-03-15 — End: 2017-03-21
  Administered 2017-03-15: 5 [IU] via SUBCUTANEOUS
  Administered 2017-03-15: 3 [IU] via SUBCUTANEOUS
  Administered 2017-03-15: 1 [IU] via SUBCUTANEOUS
  Administered 2017-03-16: 2 [IU] via SUBCUTANEOUS
  Administered 2017-03-16: 3 [IU] via SUBCUTANEOUS
  Administered 2017-03-16: 2 [IU] via SUBCUTANEOUS
  Administered 2017-03-17: 7 [IU] via SUBCUTANEOUS
  Administered 2017-03-17: 1 [IU] via SUBCUTANEOUS
  Administered 2017-03-17: 3 [IU] via SUBCUTANEOUS
  Administered 2017-03-18 – 2017-03-19 (×3): 2 [IU] via SUBCUTANEOUS
  Administered 2017-03-19: 3 [IU] via SUBCUTANEOUS
  Administered 2017-03-20: 1 [IU] via SUBCUTANEOUS
  Administered 2017-03-20: 3 [IU] via SUBCUTANEOUS
  Administered 2017-03-21: 1 [IU] via SUBCUTANEOUS

## 2017-03-15 MED ORDER — POTASSIUM CHLORIDE CRYS ER 20 MEQ PO TBCR
40.0000 meq | EXTENDED_RELEASE_TABLET | Freq: Once | ORAL | Status: AC
  Administered 2017-03-15: 40 meq via ORAL
  Filled 2017-03-15: qty 2

## 2017-03-15 MED ORDER — IOPAMIDOL (ISOVUE-370) INJECTION 76%
INTRAVENOUS | Status: AC
  Filled 2017-03-15: qty 50

## 2017-03-15 MED ORDER — METOPROLOL SUCCINATE ER 25 MG PO TB24
75.0000 mg | ORAL_TABLET | Freq: Two times a day (BID) | ORAL | Status: DC
  Administered 2017-03-15 – 2017-03-21 (×12): 75 mg via ORAL
  Filled 2017-03-15 (×12): qty 1

## 2017-03-15 MED ORDER — METOPROLOL TARTRATE 5 MG/5ML IV SOLN
INTRAVENOUS | Status: AC
  Administered 2017-03-15: 2.5 mg via INTRAVENOUS
  Filled 2017-03-15: qty 15

## 2017-03-15 MED ORDER — INSULIN GLARGINE 100 UNIT/ML ~~LOC~~ SOLN
5.0000 [IU] | Freq: Every day | SUBCUTANEOUS | Status: DC
  Administered 2017-03-15 – 2017-03-21 (×7): 5 [IU] via SUBCUTANEOUS
  Filled 2017-03-15 (×7): qty 0.05

## 2017-03-15 MED ORDER — POTASSIUM CHLORIDE CRYS ER 20 MEQ PO TBCR: 20.0000 meq | EXTENDED_RELEASE_TABLET | Freq: Every day | ORAL | Status: DC

## 2017-03-15 MED ORDER — IOPAMIDOL (ISOVUE-370) INJECTION 76%
INTRAVENOUS | Status: AC
Start: 2017-03-15 — End: 2017-03-15
  Administered 2017-03-15: 120 mL via INTRAVENOUS
  Filled 2017-03-15: qty 100

## 2017-03-15 NOTE — Evaluation (Signed)
Physical Therapy Evaluation Patient Details Name: [name] MRN: [id number] DOB: [birth year] Today's Date: 03/15/2017   History of Present Illness  Pt is an 83 yo female with admitted with complaints of increasing dyspnea over the last week dx with congestive heart failure. Currently, preparing for TAVR. PMH for type 2 diabetes, hypertension, CHF (EF45% -   50%),  Pafib () Moderate Aortic stenosis , Mild MR, moderate pulm hypertension  Clinical Impression  Pt admitted with above diagnosis. Pt currently with functional limitations due to the deficits listed below (see PT Problem List). Pt is currently, minA for bed mobility and transfers and min guard for ambulation of 250 feet with 4 seated rest breaks. Pt will benefit from skilled PT to increase their independence and safety with mobility to allow discharge to the venue listed below.       Follow Up Recommendations Home health PT;Supervision/Assistance - 24 hour    Equipment Recommendations  None recommended by PT    Recommendations for Other Services       Precautions / Restrictions Precautions Precautions: Fall Restrictions Weight Bearing Restrictions: No      Mobility  Bed Mobility Overal bed mobility: Needs Assistance Bed Mobility: Supine to Sit     Supine to sit: Min assist     General bed mobility comments: minA for trunk to upright, and pad scoot of hips to EoB, vc for hand placement  Transfers Overall transfer level: Needs assistance Equipment used: Rolling walker (2 wheeled) Transfers: Sit to/from Stand Sit to Stand: Min guard;Min assist         General transfer comment: min guard with initial sit>stand but required 3 sitting rest breaks during 6-minute walk test and required minA for last sit>stand  Ambulation/Gait Ambulation/Gait assistance: Min guard Ambulation Distance (Feet): 250 Feet Assistive device: Rolling walker (2 wheeled) Gait Pattern/deviations: Step-through pattern;Decreased step length  - right;Decreased step length - left;Trunk flexed;Shuffle Gait velocity: slowed Gait velocity interpretation: Below normal speed for age/gender General Gait Details: min guard for safety, shuffling steady gait, vc for staying inside walker and looking up and out when walking      Balance Overall balance assessment: Needs assistance Sitting-balance support: Feet supported;No upper extremity supported Sitting balance-Leahy Scale: Fair     Standing balance support: During functional activity;No upper extremity supported Standing balance-Leahy Scale: Fair Standing balance comment: able to pull up depends with no assist or LoB                             Pertinent Vitals/Pain Pain Assessment: No/denies pain    Home Living Family/patient expects to be discharged to:: Private residence Living Arrangements: Children Available Help at Discharge: Family;Available 24 hours/day Type of Home: House Home Access: Ramped entrance     Home Layout: One level Home Equipment: Walker - 2 wheels;Tub bench;Bedside commode;Grab bars - tub/shower;Grab bars - toilet;Wheelchair - manual      Prior Function Level of Independence: Independent with assistive device(s) (limited community ambulation, bathes and dresses herself)                  Extremity/Trunk Assessment   Upper Extremity Assessment Upper Extremity Assessment: Generalized weakness    Lower Extremity Assessment Lower Extremity Assessment: Generalized weakness    Cervical / Trunk Assessment Cervical / Trunk Assessment: Kyphotic  Communication   Communication: No difficulties  Cognition Arousal/Alertness: Awake/alert Behavior During Therapy: WFL for tasks assessed/performed Overall Cognitive Status: History of cognitive impairments -  at baseline                                        General Comments General comments (skin integrity, edema, etc.): daughter present throughout session          Assessment/Plan    PT Assessment Patient needs continued PT services  PT Problem List Decreased activity tolerance;Decreased strength;Decreased mobility;Cardiopulmonary status limiting activity       PT Treatment Interventions DME instruction;Gait training;Functional mobility training;Therapeutic activities;Therapeutic exercise;Balance training;Patient/family education    PT Goals (Current goals can be found in the Care Plan section)  Acute Rehab PT Goals Patient Stated Goal: go home PT Goal Formulation: With patient Time For Goal Achievement: 03/22/17 Potential to Achieve Goals: Fair    Frequency Min 3X/week    AM-PAC PT "6 Clicks" Daily Activity  Outcome Measure Difficulty turning over in bed (including adjusting bedclothes, sheets and blankets)?: A Lot Difficulty moving from lying on back to sitting on the side of the bed? : Unable Difficulty sitting down on and standing up from a chair with arms (e.g., wheelchair, bedside commode, etc,.)?: A Little Help needed moving to and from a bed to chair (including a wheelchair)?: A Little Help needed walking in hospital room?: A Little Help needed climbing 3-5 steps with a railing? : A Lot 6 Click Score: 14    End of Session Equipment Utilized During Treatment: Gait belt;Oxygen Activity Tolerance: Patient tolerated treatment well Patient left: in chair;with nursing/sitter in room;with family/visitor present Nurse Communication: Mobility status PT Visit Diagnosis: Other abnormalities of gait and mobility (R26.89);Muscle weakness (generalized) (M62.81)    Time: 4388-8757 PT Time Calculation (min) (ACUTE ONLY): 57 min   Charges:   PT Evaluation $PT Eval Moderate Complexity: 1 Mod PT Treatments $Gait Training: 23-37 mins $Therapeutic Activity: 8-22 mins   PT G Codes:        Abbey Veith B. Beverely Risen PT, DPT Acute Rehabilitation  [phone] Pager [phone]   Elon Alas Fleet 03/15/2017, 5:13 PM

## 2017-03-15 NOTE — Progress Notes (Signed)
PROGRESS NOTE    [name]  IOE:[id number] DOB: [birth year] DOA: 03/11/2017 PCP: Lorenda Ishihara, MD   Brief Narrative: 83 year old female with type 2 diabetes, hypertension, congestive heart failure,  atrial fibrillation on anticoagulation, aortic stenosis, presented with dyspnea for 1 week. In the ED patient was found to have elevated BNP consistent with congestive heart failure. Patient was admitted for further evaluation.  Assessment & Plan:   Acute on chronic systolic congestive heart failure: -Continue IV Lasix, monitor urine output, BMP. Cardiac consult appreciated.  Severe aortic stenosis: Cardiac cath was canceled yesterday due to Confusion. Patient Was Alert Awake and Oriented to Hospital This Morning. As per Patient's Daughter This Is Her Baseline Mental Status. Cardiology Evaluation Ongoing for Further Testing Including CT Scan. Patient has dyspnea on exertion.  Hypokalemia: Replete potassium chloride. Monitor BMP.  Nonsustained V. tach: Symptomatic. Maintain electrolytes.  Permanent atrial fibrillation: Monitor heart rate. Coumadin was resumed with cardiology. Monitor INR.  Hypertension: Monitor blood pressure. Continue lisinopril, metoprolol and current medication. Advised discontinue by cardiologist  Diabetes with hyperglycemia: Continue current insulin regimen. Monitor blood sugar level.  Acute metabolic encephalopathy. Currently mental status at baseline. Nonfocal neurologic exam. CTH with chronic changes.  DVT prophylaxis: Coumadin and SCD. Code Status: Full code Family Communication: No family at bedside Disposition Plan: Likely discharge home in 1-2 days    Consultants:   Cardiology  Procedures: None Antimicrobials: None  Subjective: Seen and examined at bedside. Patient was sitting on bed comfortable. Denied headache, dizziness, nausea vomiting chest pain shortness of breath. Daughter at bedside.  Objective: Vitals:   03/14/17 2053  03/15/17 0655 03/15/17 0956 03/15/17 1211  BP: 126/73 131/87 120/73 122/78  Pulse: 93 82 94 87  Resp: 18 18  20   Temp: 97.9 F (36.6 C) (!) 97.5 F (36.4 C) 97.9 F (36.6 C) 98 F (36.7 C)  TempSrc: Oral Oral Oral Oral  SpO2: 100% 100% 99% 96%  Weight:  67.1 kg (148 lb)    Height:        Intake/Output Summary (Last 24 hours) at 03/15/17 1433 Last data filed at 03/15/17 1422  Gross per 24 hour  Intake             2820 ml  Output              300 ml  Net             2520 ml   Filed Weights   03/13/17 0700 03/14/17 0600 03/15/17 0655  Weight: 66.9 kg (147 lb 8 oz) 68.5 kg (151 lb) 67.1 kg (148 lb)    Examination:  General exam: Appears calm and comfortable  Respiratory system: Clear to auscultation. Respiratory effort normal. No wheezing or crackle Cardiovascular system: S1 & S2 heard, RRR.  No pedal edema. Gastrointestinal system: Abdomen is nondistended, soft and nontender. Normal bowel sounds heard. Central nervous system: AlertAwake and oriented to name, colonoscopy and August 2018. Extremities: Symmetric 5 x 5 power. Skin: No rashes, lesions or ulcers Psychiatry: Judgement and insight appear age-appropriate.     Data Reviewed: I have personally reviewed following labs and imaging studies  CBC:  Recent Labs Lab 03/11/17 1554 03/12/17 0258 03/12/17 1412 03/13/17 0434 03/15/17 0628  WBC 7.3 6.1  --  6.4 5.4  HGB 13.0 12.5  --  12.9 12.9  HCT 40.1 39.2 39.9 40.4 41.5  MCV 92.2 93.6  --  94.6 95.4  PLT 167 169  --  178 200   Basic Metabolic  Panel:  Recent Labs Lab 03/11/17 1554 03/12/17 0258 03/12/17 1412 03/13/17 0434 03/14/17 0546 03/15/17 0628  NA 138 145  --  146* 142 145  K 3.4* 3.3*  --  3.7 3.6 3.4*  CL 105 111  --  110 108 109  CO2 24 27  --  25 24 26   GLUCOSE 288* 161*  --  164* 214* 145*  BUN 18 16  --  15 21* 14  CREATININE 1.07* 1.01*  --  1.06* 1.20* 1.07*  CALCIUM 9.9 10.0  --  10.1 10.0 10.2  MG  --   --  2.2  --  2.2 2.2    GFR: Estimated Creatinine Clearance: 33.4 mL/min (A) (by C-G formula based on SCr of 1.07 mg/dL (H)). Liver Function Tests:  Recent Labs Lab 03/12/17 0258  AST 19  ALT 25  ALKPHOS 59  BILITOT 1.2  PROT 6.6  ALBUMIN 3.3*   No results for input(s): LIPASE, AMYLASE in the last 168 hours.  Recent Labs Lab 03/12/17 1412  AMMONIA 29   Coagulation Profile:  Recent Labs Lab 03/11/17 2143 03/12/17 0258 03/13/17 0434 03/14/17 0546 03/15/17 0628  INR 1.76 1.77 1.63 1.35 1.16   Cardiac Enzymes:  Recent Labs Lab 03/11/17 2143 03/12/17 0258 03/12/17 0925  TROPONINI 0.10* 0.12* 0.12*   BNP (last 3 results) No results for input(s): PROBNP in the last 8760 hours. HbA1C: No results for input(s): HGBA1C in the last 72 hours. CBG:  Recent Labs Lab 03/14/17 1130 03/14/17 1716 03/14/17 2056 03/15/17 0725 03/15/17 1120  GLUCAP 225* 162* 218* 144* 216*   Lipid Profile: No results for input(s): CHOL, HDL, LDLCALC, TRIG, CHOLHDL, LDLDIRECT in the last 72 hours. Thyroid Function Tests: No results for input(s): TSH, T4TOTAL, FREET4, T3FREE, THYROIDAB in the last 72 hours. Anemia Panel: No results for input(s): VITAMINB12, FOLATE, FERRITIN, TIBC, IRON, RETICCTPCT in the last 72 hours. Sepsis Labs: No results for input(s): PROCALCITON, LATICACIDVEN in the last 168 hours.  Recent Results (from the past 240 hour(s))  Urine Culture     Status: Abnormal   Collection Time: 03/13/17  5:10 AM  Result Value Ref Range Status   Specimen Description URINE, RANDOM  Final   Special Requests NONE  Final   Culture MULTIPLE SPECIES PRESENT, SUGGEST RECOLLECTION (A)  Final   Report Status 03/14/2017 FINAL  Final         Radiology Studies: No results found.      Scheduled Meds: . calcitRIOL  0.25 mcg Oral Daily  . cholecalciferol  1,000 Units Oral Daily  . feeding supplement (GLUCERNA SHAKE)  237 mL Oral BID BM  . furosemide  40 mg Oral BID  . insulin aspart  0-5 Units  Subcutaneous QHS  . insulin aspart  0-9 Units Subcutaneous TID WC  . insulin glargine  5 Units Subcutaneous Daily  . lisinopril  5 mg Oral Daily  . metoprolol succinate  75 mg Oral BID  . potassium chloride  40 mEq Oral Daily  . potassium chloride  40 mEq Oral Once  . QUEtiapine  25 mg Oral QHS  . rosuvastatin  10 mg Oral q1800  . sodium chloride flush  3 mL Intravenous Q12H  . vitamin B-12  1,000 mcg Oral Daily  . warfarin  5 mg Oral ONCE-1800  . Warfarin - Pharmacist Dosing Inpatient   Does not apply q1800   Continuous Infusions: . sodium chloride       LOS: 3 days    Dron Elpidio Anis  Ronalee Belts, MD [facility] Pager [phone]  If 7PM-7AM, please contact night-coverage [email or web address] Password [facility] 03/15/2017, 2:33 PM

## 2017-03-15 NOTE — Progress Notes (Signed)
  PFT was not able to be done this morning due to patients confusion. R.Broooks,RN notified. She will follow up and we will reschedule at a more appropriate time.

## 2017-03-15 NOTE — Progress Notes (Addendum)
Physical Therapy TAVR Evaluation  6 Minute Walk Test using Rolling Walker  Distance - 189 ft - Pt required 3 seated rest breaks during completion of test      Pre    Post  BP     126/79   143/84  HR    96 bpm   57bpm  SaO2    90%O2   92%O2  Modified Borg Dyspnea 5    7   RPE    11    18  * pt with mild dementia and difficulty rating dyspnea and RPE    5 Meter Walk Test using Rolling Walker  Trial   1) 17.02 sec   2) 12.05 sec  3) 15.07sec  Average - 14.71 sec = 0.90 ft/sec   Clinical Frailty Scale - 6  Tessica Cupo B. Beverely Risen PT, DPT Acute Rehabilitation  [phone] Pager [phone]

## 2017-03-15 NOTE — Progress Notes (Signed)
ANTICOAGULATION CONSULT NOTE  Pharmacy Consult:  Coumadin Indication: atrial fibrillation  No Known Allergies  Patient Measurements: Height: 5\' 3"  (160 cm) Weight: 148 lb (67.1 kg) IBW/kg (Calculated) : 52.4  Assessment: 89 YOF continues on Coumadin 4mg  daily exc for 2mg  on Wed / Thurs PTA for Afib. INR 1.77 on admit. INR down to 1.16 today with after being held last few days. ChadsVasc = 6. Now Cards restarting s/p cath. Hgb and plts stable.  Goal of Therapy:  INR 2-3 Monitor platelets by anticoagulation protocol: Yes  Plan:  Give Coumadin 5mg  PO x 1 tonight Monitor daily INR, CBC, s/s of bleed F/U heart cath  Enzo Bi, PharmD, BCPS Clinical Pharmacist Pager [phone] 03/15/2017 1:14 PM

## 2017-03-15 NOTE — Progress Notes (Signed)
Progress Note  Patient Name: [name] Date of Encounter: 03/15/2017  Primary Cardiologist: Dr. Graciela Husbands   Subjective   Still somewhat confused. No chest pain, SOB or palpitations.   Inpatient Medications    Scheduled Meds: . calcitRIOL  0.25 mcg Oral Daily  . cholecalciferol  1,000 Units Oral Daily  . diltiazem  120 mg Oral Daily  . feeding supplement (GLUCERNA SHAKE)  237 mL Oral BID BM  . furosemide  40 mg Oral BID  . insulin aspart  0-5 Units Subcutaneous QHS  . insulin aspart  0-9 Units Subcutaneous TID WC  . insulin glargine  5 Units Subcutaneous Daily  . lisinopril  5 mg Oral Daily  . metoprolol succinate  50 mg Oral BID  . potassium chloride  40 mEq Oral Daily  . QUEtiapine  25 mg Oral QHS  . rosuvastatin  10 mg Oral q1800  . sodium chloride flush  3 mL Intravenous Q12H  . vitamin B-12  1,000 mcg Oral Daily  . Warfarin - Pharmacist Dosing Inpatient   Does not apply q1800   Continuous Infusions: . sodium chloride     PRN Meds: sodium chloride, acetaminophen **OR** acetaminophen, hydrALAZINE, LORazepam, sodium chloride flush   Vital Signs    Vitals:   03/14/17 1323 03/14/17 2053 03/15/17 0655 03/15/17 0956  BP: 138/76 126/73 131/87 120/73  Pulse: 86 93 82 94  Resp: 18 18 18    Temp: 98 F (36.7 C) 97.9 F (36.6 C) (!) 97.5 F (36.4 C) 97.9 F (36.6 C)  TempSrc: Oral Oral Oral Oral  SpO2: 98% 100% 100% 99%  Weight:   148 lb (67.1 kg)   Height:        Intake/Output Summary (Last 24 hours) at 03/15/17 1015 Last data filed at 03/15/17 0933  Gross per 24 hour  Intake             2640 ml  Output              620 ml  Net             2020 ml   Filed Weights   03/13/17 0700 03/14/17 0600 03/15/17 0655  Weight: 147 lb 8 oz (66.9 kg) 151 lb (68.5 kg) 148 lb (67.1 kg)    Telemetry    AFib at controlled rate. NSVT x 6 beats - Personally Reviewed  ECG    None today   Physical Exam   GEN: Elderly frail female in NAD. Somewhat confused    Neck:  No JVD Cardiac: IR IR, Systolic murmurs, rubs, or gallops.  Respiratory: Clear to auscultation bilaterally. GI: Soft, nontender, non-distended  MS: No edema; No deformity. Neuro:  Nonfocal. Oriented to place but not to time. Able to answer president however needed to give multiple choice Psych: Normal affect   Labs    Chemistry Recent Labs Lab 03/12/17 0258 03/13/17 0434 03/14/17 0546 03/15/17 0628  NA 145 146* 142 145  K 3.3* 3.7 3.6 3.4*  CL 111 110 108 109  CO2 27 25 24 26   GLUCOSE 161* 164* 214* 145*  BUN 16 15 21* 14  CREATININE 1.01* 1.06* 1.20* 1.07*  CALCIUM 10.0 10.1 10.0 10.2  PROT 6.6  --   --   --   ALBUMIN 3.3*  --   --   --   AST 19  --   --   --   ALT 25  --   --   --   ALKPHOS 59  --   --   --  BILITOT 1.2  --   --   --   GFRNONAA 48* 45* 39* 45*  GFRAA 56* 53* 45* 52*  ANIONGAP 7 11 10 10      Hematology Recent Labs Lab 03/12/17 0258 03/12/17 1412 03/13/17 0434 03/15/17 0628  WBC 6.1  --  6.4 5.4  RBC 4.19  --  4.27 4.35  HGB 12.5  --  12.9 12.9  HCT 39.2 39.9 40.4 41.5  MCV 93.6  --  94.6 95.4  MCH 29.8  --  30.2 29.7  MCHC 31.9  --  31.9 31.1  RDW 14.8  --  14.7 14.7  PLT 169  --  178 200    Cardiac Enzymes Recent Labs Lab 03/11/17 2143 03/12/17 0258 03/12/17 0925  TROPONINI 0.10* 0.12* 0.12*    Recent Labs Lab 03/11/17 1638  TROPIPOC 0.18*     BNP Recent Labs Lab 03/11/17 1616  BNP 1,286.5*     DDimer No results for input(s): DDIMER in the last 168 hours.   Radiology    No results found.  Cardiac Studies   Echo 03/13/17 Left ventricle: The cavity size was normal. There was severe concentric hypertrophy. Systolic function was moderately reduced. The estimated ejection fraction was in the range of 35% to 40%. Wall motion was normal; there were no regional wall motion abnormalities. The study was not technically sufficient to allow evaluation of LV diastolic dysfunction due to atrial fibrillation. -  Aortic valve: Valve mobility was restricted. Mean gradient (S): 43 mm Hg. Peak gradient (S): 75 mm Hg. Valve area (VTI): 0.45 cm^2. Valve area (Vmax): 0.46 cm^2. Valve area (Vmean): 0.42 cm^2. - Mitral valve: Calcified annulus. Mildly thickened leaflets . There was mild regurgitation. - Left atrium: The atrium was moderately dilated. - Right ventricle: The cavity size was mildly dilated. Wall thickness was normal. Systolic function was mildly reduced. - Right atrium: The atrium was moderately dilated. - Tricuspid valve: There was moderate regurgitation. - Pulmonary arteries: Systolic pressure was moderately increased. PA peak pressure: 47 mm Hg (S).  Impressions:  - When compared to the prior study from 09/29/2014 LVEF has decreased from 45-50% to 35-40% and aortic stenosis is now severe. Moderate pulmonary hypertension.  Patient Profile     83 y.o. female with hx of diabetes mellitus, hyperlipidemia, permanent atrial fibrillation, pacemaker for tachycardia-bradycardia syndrome, chronic kidney disease stage III here with acute on chronic heart failure due to moderate left ventricular dysfunction and critical aortic stenosis.     Assessment & Plan    1. Acute on chronic systolic CHF due to critical AS and pHTN - net I & O negative 2.6 yesterday. Today intake of + 2.4L. Total weight down 2lb. Volume status stable. Continue current dose of lasix at 40mg  BID.   2. Severe AS - L and R cath cancelled yesterday due to confusion. Still somewhat confused. ? As outpatient or later this week.   3. Hypokalemia - K of 3.4 today. On supplement.   4. NSVT x 6 beats  - asymptomatic. Will keep K > 4. Will up-titrate Kdur. Mg of 2.2  5. permanent atrial fibrillation - rate controlled. Coumadin resumed--> per pharmacy. INR of 1.16 today.   Signed, Manson Passey, PA  03/15/2017, 10:15 AM    I have seen and examined the patient along with Bhagat,Bhavinkumar, PA .  I  have reviewed the chart, notes and new data.  I agree with PA's note.  Key new complaints: denies dyspnea and angina Key examination changes: no edema  or rales, mid-peaking musical systolic murmur Key new findings / data: K 3.4, Creat 1.07 (back down).  PLAN: Will get the chest CT angio of the aorta as part of pre TAVR workup, with plans for her to be seen in valve clinic as an outpatient. It may be more accurate to assess her mental status in the outpatient setting.  Stop diltiazem due to low EF. Increase beta blocker to maintain adequate rate control. Restart warfarin - CHADSVasc 5 (age 57, HTN, CHF, gender). However, probably not necessary to be therapeutic before DC. We can arrange follow up in coumadin clinic early next week.  Thurmon Fair, MD, [facility] [phone] 03/15/2017, 11:16 AM

## 2017-03-16 LAB — GLUCOSE, CAPILLARY
GLUCOSE-CAPILLARY: 165 mg/dL — AB (ref 65–99)
GLUCOSE-CAPILLARY: 224 mg/dL — AB (ref 65–99)
GLUCOSE-CAPILLARY: 262 mg/dL — AB (ref 65–99)
Glucose-Capillary: 163 mg/dL — ABNORMAL HIGH (ref 65–99)

## 2017-03-16 LAB — BASIC METABOLIC PANEL
ANION GAP: 5 (ref 5–15)
BUN: 20 mg/dL (ref 6–20)
CALCIUM: 10.3 mg/dL (ref 8.9–10.3)
CO2: 26 mmol/L (ref 22–32)
CREATININE: 1.13 mg/dL — AB (ref 0.44–1.00)
Chloride: 109 mmol/L (ref 101–111)
GFR, EST AFRICAN AMERICAN: 49 mL/min — AB (ref >60)
GFR, EST NON AFRICAN AMERICAN: 42 mL/min — AB (ref >60)
GLUCOSE: 142 mg/dL — AB (ref 65–99)
Potassium: 3.8 mmol/L (ref 3.5–5.1)
Sodium: 140 mmol/L (ref 135–145)

## 2017-03-16 LAB — PROTIME-INR
INR: 1.19
PROTHROMBIN TIME: 15 s (ref 11.4–15.2)

## 2017-03-16 LAB — HEPARIN LEVEL (UNFRACTIONATED): HEPARIN UNFRACTIONATED: 0.69 [IU]/mL (ref 0.30–0.70)

## 2017-03-16 MED ORDER — HEPARIN (PORCINE) IN NACL 100-0.45 UNIT/ML-% IJ SOLN
850.0000 [IU]/h | INTRAMUSCULAR | Status: DC
  Administered 2017-03-16: 850 [IU]/h via INTRAVENOUS

## 2017-03-16 MED ORDER — WARFARIN SODIUM 5 MG PO TABS
5.0000 mg | ORAL_TABLET | Freq: Once | ORAL | Status: AC
  Administered 2017-03-16: 5 mg via ORAL
  Filled 2017-03-16: qty 1

## 2017-03-16 MED ORDER — HEPARIN (PORCINE) IN NACL 100-0.45 UNIT/ML-% IJ SOLN
600.0000 [IU]/h | INTRAMUSCULAR | Status: DC
  Administered 2017-03-16 – 2017-03-17 (×2): 800 [IU]/h via INTRAVENOUS
  Administered 2017-03-18 – 2017-03-20 (×3): 600 [IU]/h via INTRAVENOUS
  Filled 2017-03-16 (×3): qty 250

## 2017-03-16 MED ORDER — HEPARIN BOLUS VIA INFUSION
3000.0000 [IU] | Freq: Once | INTRAVENOUS | Status: AC
  Administered 2017-03-16: 3000 [IU] via INTRAVENOUS
  Filled 2017-03-16: qty 3000

## 2017-03-16 NOTE — Care Management Important Message (Signed)
Important Message  Patient Details  Name: [name] MRN: [id number] Date of Birth: Nov 14, 1926   Medicare Important Message Given:       Dorena Bodo 03/16/2017, 11:59 AM

## 2017-03-16 NOTE — Progress Notes (Signed)
PROGRESS NOTE    [name]  VWU:[id number] DOB: December 22, 1926 DOA: 03/11/2017 PCP: Lorenda Ishihara, MD   Brief Narrative: 83 year old female with type 2 diabetes, hypertension, congestive heart failure,  atrial fibrillation on anticoagulation, aortic stenosis, presented with dyspnea for 1 week. In the ED patient was found to have elevated BNP consistent with congestive heart failure. Patient was admitted for further evaluation.  Assessment & Plan:   #Acute on chronic systolic congestive heart failure: -Shortness of breath improved. Currently on oral Lasix 40 twice a day. Monitor urine output and BMP. Cardiology consult appreciated.  #Severe aortic stenosis: Cardiac cath was canceled  due to pt's confusion. Mental status improved and around baseline, as per patient's daughter. -CT coronary angiogram consistent with possible left atrial thrombus. Started on IV heparin for bridging and continuing Coumadin by cardiologist. Further plan defer to cardiology.  #Hypokalemia: Improved. Monitor BMP.  #Nonsustained V. tach: asymptomatic. Maintain electrolytes.  #Permanent atrial fibrillation: Monitor heart rate. Currently on IV heparin and Coumadin. Monitor INR.  #Hypertension: Monitor blood pressure. Continue lisinopril, metoprolol and current medication.   #Diabetes with hyperglycemia: Continue current insulin regimen. Monitor blood sugar level.  #Acute metabolic encephalopathy. Currently mental status at baseline. Nonfocal neurologic exam. CTH with chronic changes.  DVT prophylaxis: Coumadin and IV heparin. Code Status: Full code Family Communication: Patient's daughter at bedside Disposition Plan: Likely discharge home in 1-2 days pending cardiology workup    Consultants:   Cardiology  Procedures: None Antimicrobials: None  Subjective: Seen and examined at bedside. Denied chest pain, shortness of breath, nausea vomiting. Patient's daughter at  bedside. Objective: Vitals:   03/15/17 1714 03/15/17 2100 03/16/17 0418 03/16/17 1047  BP: 133/83 107/73 119/84 (!) 123/59  Pulse: 90 88 89 73  Resp:  18 18   Temp:  97.7 F (36.5 C) 97.6 F (36.4 C)   TempSrc:  Oral Oral   SpO2: 95% 100% 100% 99%  Weight:   61.6 kg (135 lb 11.2 oz)   Height:        Intake/Output Summary (Last 24 hours) at 03/16/17 1239 Last data filed at 03/16/17 0957  Gross per 24 hour  Intake              540 ml  Output             1050 ml  Net             -510 ml   Filed Weights   03/14/17 0600 03/15/17 0655 03/16/17 0418  Weight: 68.5 kg (151 lb) 67.1 kg (148 lb) 61.6 kg (135 lb 11.2 oz)    Examination:  General exam: Elderly female lying in bed comfortably, not in distress  Respiratory system: Clear bilateral. Respiratory effort normal. No wheezing or crackle Cardiovascular system: Regular rate and rhythm, S1-S2 normal.  No pedal edema. Gastrointestinal system: Abdomen is nondistended, soft and nontender. Normal bowel sounds heard. Central nervous system: AlertAwake and oriented to name, colonoscopy and August 2018. Extremities: Symmetric 5 x 5 power. Skin: No rashes, lesions or ulcers Psychiatry: Judgement and insight appear age-appropriate.     Data Reviewed: I have personally reviewed following labs and imaging studies  CBC:  Recent Labs Lab 03/11/17 1554 03/12/17 0258 03/12/17 1412 03/13/17 0434 03/15/17 0628  WBC 7.3 6.1  --  6.4 5.4  HGB 13.0 12.5  --  12.9 12.9  HCT 40.1 39.2 39.9 40.4 41.5  MCV 92.2 93.6  --  94.6 95.4  PLT 167 169  --  178  200   Basic Metabolic Panel:  Recent Labs Lab 03/12/17 0258 03/12/17 1412 03/13/17 0434 03/14/17 0546 03/15/17 0628 03/16/17 0358  NA 145  --  146* 142 145 140  K 3.3*  --  3.7 3.6 3.4* 3.8  CL 111  --  110 108 109 109  CO2 27  --  25 24 26 26   GLUCOSE 161*  --  164* 214* 145* 142*  BUN 16  --  15 21* 14 20  CREATININE 1.01*  --  1.06* 1.20* 1.07* 1.13*  CALCIUM 10.0  --   10.1 10.0 10.2 10.3  MG  --  2.2  --  2.2 2.2  --    GFR: Estimated Creatinine Clearance: 28.5 mL/min (A) (by C-G formula based on SCr of 1.13 mg/dL (H)). Liver Function Tests:  Recent Labs Lab 03/12/17 0258  AST 19  ALT 25  ALKPHOS 59  BILITOT 1.2  PROT 6.6  ALBUMIN 3.3*   No results for input(s): LIPASE, AMYLASE in the last 168 hours.  Recent Labs Lab 03/12/17 1412  AMMONIA 29   Coagulation Profile:  Recent Labs Lab 03/12/17 0258 03/13/17 0434 03/14/17 0546 03/15/17 0628 03/16/17 0358  INR 1.77 1.63 1.35 1.16 1.19   Cardiac Enzymes:  Recent Labs Lab 03/11/17 2143 03/12/17 0258 03/12/17 0925  TROPONINI 0.10* 0.12* 0.12*   BNP (last 3 results) No results for input(s): PROBNP in the last 8760 hours. HbA1C: No results for input(s): HGBA1C in the last 72 hours. CBG:  Recent Labs Lab 03/15/17 1120 03/15/17 1621 03/15/17 2106 03/16/17 0724 03/16/17 1124  GLUCAP 216* 272* 227* 163* 165*   Lipid Profile: No results for input(s): CHOL, HDL, LDLCALC, TRIG, CHOLHDL, LDLDIRECT in the last 72 hours. Thyroid Function Tests: No results for input(s): TSH, T4TOTAL, FREET4, T3FREE, THYROIDAB in the last 72 hours. Anemia Panel: No results for input(s): VITAMINB12, FOLATE, FERRITIN, TIBC, IRON, RETICCTPCT in the last 72 hours. Sepsis Labs: No results for input(s): PROCALCITON, LATICACIDVEN in the last 168 hours.  Recent Results (from the past 240 hour(s))  Urine Culture     Status: Abnormal   Collection Time: 03/13/17  5:10 AM  Result Value Ref Range Status   Specimen Description URINE, RANDOM  Final   Special Requests NONE  Final   Culture MULTIPLE SPECIES PRESENT, SUGGEST RECOLLECTION (A)  Final   Report Status 03/14/2017 FINAL  Final         Radiology Studies: Ct Coronary Morph W/cta Cor W/score W/ca W/cm &/or Wo/cm  Addendum Date: 03/16/2017   ADDENDUM REPORT: 03/16/2017 08:09 EXAM: OVER-READ INTERPRETATION  CT CHEST The following report is an  over-read performed by radiologist Dr. Royal Piedra [facility] on 03/16/2017. This over-read does not include interpretation of cardiac or coronary anatomy or pathology. The cardiac CTA interpretation by the cardiologist is attached. COMPARISON:  None. FINDINGS: Extracardiac findings will be described separately under dictation for contemporaneously obtained CTA of the chest, abdomen and pelvis. IMPRESSION: Please see separate dictation for contemporaneously obtained CTA of the chest, abdomen and pelvis 03/16/2015 for full description of relevant extracardiac findings. Electronically Signed   By: Trudie Reed M.D.   On: 03/16/2017 08:09   Result Date: 03/16/2017 CLINICAL DATA:  Aortic Stenosis EXAM: Cardiac TAVR CT TECHNIQUE: The patient was scanned on a Siemens Force 192 slice scanner. A 120 kV retrospective scan was triggered in the ascending thoracic aorta at 140 HU's. Gantry rotation speed was 250 msecs and collimation was .6 mm. No beta blockade or  nitro were given. The 3D data set was reconstructed in 5% intervals of the R-R cycle. Systolic and diastolic phases were analyzed on a dedicated work station using MPR, MIP and VRT modes. The patient received 80 cc of contrast. FINDINGS: Aortic Valve: Tri leaflet thickened and calcified with fusion of right and non coronary cusps Aorta: Significant calcified aortic atherosclerosis and soft plaque in the ascending thoracic aorta and arch. Some aneurysmal dilatation of the distal arch Sino-tubular Junction:  24 mm Ascending Thoracic Aorta:  34 mm Aortic Arch:  36 mm distal aneurysmal segment Descending Thoracic Aorta:  24 mm Sinus of Valsalva Measurements: Non-coronary:  27.9 mm Right - coronary:  27.5 mm Left -   coronary:  27.9 mm Coronary Artery Height above Annulus: Left Main:  Some what shallow 10.3 mm Right Coronary:  Some what shallow 10.8 mm Virtual Basal Annulus Measurements: Maximum / Minimum Diameter:  25.8 mm x 20.1 mm Perimeter:   73.2 mm Area:  405 mm 2 Coronary Arteries: Sufficient height above annulus for Sapien 3 delivery Optimum Fluoroscopic Angle for Delivery: LAO 15 degrees Caudal 13 degrees IMPRESSION: 1) Calcified tri leaflet aortic valve with fusion of right and non coronary cusp and annulus with perimeter 73.2 mm area 405 mm2 2) LAA thrombus appears present no delayed images performed consider TEE confirmation 3) Optimum angiographic angle for delivery LAO 15 degrees Caudal 13 degrees 4) Coronary height sufficient for Sapien 3 delivery Area measurement suitable for a 23 mm Sapien 3 valve Perimeter measurement along with shallow coronary and sinus less than 29 mm suggests suitable for a 26 mm Ev[employer]ut [employer] Electronically Signed: By: Charlton Haws M.D. On: 03/15/2017 18:18   Ct Angio Chest Aorta W/cm &/or Wo/cm  Result Date: 03/16/2017 CLINICAL DATA:  83 year old female with history of severe aortic stenosis. Preprocedural study prior to potential transcatheter aortic valve replacement (TAVR) procedure. EXAM: CT ANGIOGRAPHY CHEST, ABDOMEN AND PELVIS TECHNIQUE: Multidetector CT imaging through the chest, abdomen and pelvis was performed using the standard protocol during bolus administration of intravenous contrast. Multiplanar reconstructed images and MIPs were obtained and reviewed to evaluate the vascular anatomy. CONTRAST:  120 mL of Isovue 370. COMPARISON:  Chest CT 07/25/2005. FINDINGS: CTA CHEST FINDINGS Cardiovascular: Heart size is enlarged with biatrial dilatation. There is no significant pericardial fluid, thickening or pericardial calcification. There is aortic atherosclerosis, as well as atherosclerosis of the great vessels of the mediastinum and the coronary arteries, including calcified atherosclerotic plaque in the left main, left anterior descending, left circumflex and right coronary arteries. Left-sided biventricular pacemaker with lead tips terminating in the right ventricular apex and overlying the  lateral wall the left ventricle via the coronary sinus and coronary veins. Some mild thickening and calcification of the aortic valve. Tiny filling defect in the tip of the left atrial appendage (axial image 38 of series 14), suspicious for small volume of left atrial appendage thrombus. Mediastinum/Lymph Nodes: No pathologically enlarged mediastinal or hilar lymph nodes. Esophagus is unremarkable in appearance. No axillary lymphadenopathy. Lungs/Pleura: Small cluster of peribronchovascular micronodularity in the posterior aspect of the right upper lobe near the apex, presumably areas of mucoid impaction within terminal bronchioles. 4 mm pulmonary nodule in the right middle lobe (axial image 40 of series 15). Trace right pleural effusion partially loculated in the right major fissure. No left pleural effusion. No definite acute consolidative airspace disease. Musculoskeletal/Soft Tissues: There are no aggressive appearing lytic or blastic lesions noted in the visualized portions of the skeleton. CTA  ABDOMEN AND PELVIS FINDINGS Hepatobiliary: No cystic or solid hepatic lesions. No intra or extrahepatic biliary ductal dilatation. Gallbladder is normal in appearance. Pancreas: No pancreatic mass. No pancreatic ductal dilatation. No pancreatic or peripancreatic fluid or inflammatory changes. Spleen: Unremarkable. Adrenals/Urinary Tract: Multifocal cortical thinning in the kidneys bilaterally. Subcentimeter low-attenuation lesion in the lateral aspect of the interpolar region of the right kidney is too small to definitively characterize, but likely to represent a cyst. No hydroureteronephrosis. Urinary bladder is normal in appearance. Bilateral adrenal glands are normal in appearance. Stomach/Bowel: Normal appearance of the stomach. No pathologic dilatation of small bowel or colon. Normal appendix. Vascular/Lymphatic: Aortic atherosclerosis, without evidence of aneurysm or dissection in the abdominal or pelvic  vasculature. Vascular findings and measurements pertinent to potential TAVR procedure, as detailed below. Celiac axis, superior mesenteric artery and inferior mesenteric artery all appear widely patent without definite hemodynamically significant stenosis. Single renal arteries bilaterally also appear widely patent without hemodynamically significant stenosis. No lymphadenopathy identified in the abdomen or pelvis. Reproductive: Status post hysterectomy. Ovaries are not confidently identified may be surgically absent or atrophic. Other: No significant volume of ascites.  No pneumoperitoneum. Musculoskeletal: There are no aggressive appearing lytic or blastic lesions noted in the visualized portions of the skeleton. VASCULAR MEASUREMENTS PERTINENT TO TAVR: AORTA: Minimal Aortic Diameter -  12 x 13 mm Severity of Aortic Calcification -  severe RIGHT PELVIS: Right Common Iliac Artery - Minimal Diameter - 7.9 x 8.7 mm Tortuosity - mild Calcification - moderate Right External Iliac Artery - Minimal Diameter - 6.4 x 6.4 mm Tortuosity - mild Calcification - minimal Right Common Femoral Artery - Minimal Diameter - 5.8 x 6.0 mm Tortuosity - mild Calcification - mild LEFT PELVIS: Left Common Iliac Artery - Minimal Diameter - 8.5 x 8.3 mm Tortuosity - mild Calcification - moderate Left External Iliac Artery - Minimal Diameter - 6.7 x 6.3 mm Tortuosity - mild Calcification - minimal Left Common Femoral Artery - Minimal Diameter - 5.9 x 5.9 mm Tortuosity - mild Calcification - mild Review of the MIP images confirms the above findings. IMPRESSION: 1. Vascular findings and measurements pertinent to potential TAVR procedure, as detailed above. This patient does appear to have suitable pelvic arterial access bilaterally. 2. Mild thickening and calcification of the aortic valve. 3. Cardiomegaly with biatrial dilatation. Notably, there appears to be a small filling defect in the tip of the left atrial appendage. Further evaluation  with transesophageal echocardiography is suggested in the near future (if clinically appropriate) to evaluate for potential left atrial appendage thrombus as this places the patient at risk for systemic embolization. 4. Small right partially loculated pleural effusion, with some fluid in the right major fissure. 5. Multiple small pulmonary nodules in the right lung measuring 4 mm or less in size, nonspecific, but strongly favored to be benign. No follow-up needed if patient is low-risk (and has no known or suspected primary neoplasm). Non-contrast chest CT can be considered in 12 months if patient is high-risk. This recommendation follows the consensus statement: Guidelines for Management of Incidental Pulmonary Nodules Detected on CT Images: From the Fleischner Society 2017; Radiology 2017; 284:228-243. 6. Additional incidental findings, as above. Aortic Atherosclerosis (ICD10-I70.0). Electronically Signed   By: Trudie Reed M.D.   On: 03/16/2017 08:56   Ct Angio Abd/pel W/ And/or W/o  Result Date: 03/16/2017 CLINICAL DATA:  83 year old female with history of severe aortic stenosis. Preprocedural study prior to potential transcatheter aortic valve replacement (TAVR) procedure. EXAM: CT ANGIOGRAPHY CHEST,  ABDOMEN AND PELVIS TECHNIQUE: Multidetector CT imaging through the chest, abdomen and pelvis was performed using the standard protocol during bolus administration of intravenous contrast. Multiplanar reconstructed images and MIPs were obtained and reviewed to evaluate the vascular anatomy. CONTRAST:  120 mL of Isovue 370. COMPARISON:  Chest CT 07/25/2005. FINDINGS: CTA CHEST FINDINGS Cardiovascular: Heart size is enlarged with biatrial dilatation. There is no significant pericardial fluid, thickening or pericardial calcification. There is aortic atherosclerosis, as well as atherosclerosis of the great vessels of the mediastinum and the coronary arteries, including calcified atherosclerotic plaque in the  left main, left anterior descending, left circumflex and right coronary arteries. Left-sided biventricular pacemaker with lead tips terminating in the right ventricular apex and overlying the lateral wall the left ventricle via the coronary sinus and coronary veins. Some mild thickening and calcification of the aortic valve. Tiny filling defect in the tip of the left atrial appendage (axial image 38 of series 14), suspicious for small volume of left atrial appendage thrombus. Mediastinum/Lymph Nodes: No pathologically enlarged mediastinal or hilar lymph nodes. Esophagus is unremarkable in appearance. No axillary lymphadenopathy. Lungs/Pleura: Small cluster of peribronchovascular micronodularity in the posterior aspect of the right upper lobe near the apex, presumably areas of mucoid impaction within terminal bronchioles. 4 mm pulmonary nodule in the right middle lobe (axial image 40 of series 15). Trace right pleural effusion partially loculated in the right major fissure. No left pleural effusion. No definite acute consolidative airspace disease. Musculoskeletal/Soft Tissues: There are no aggressive appearing lytic or blastic lesions noted in the visualized portions of the skeleton. CTA ABDOMEN AND PELVIS FINDINGS Hepatobiliary: No cystic or solid hepatic lesions. No intra or extrahepatic biliary ductal dilatation. Gallbladder is normal in appearance. Pancreas: No pancreatic mass. No pancreatic ductal dilatation. No pancreatic or peripancreatic fluid or inflammatory changes. Spleen: Unremarkable. Adrenals/Urinary Tract: Multifocal cortical thinning in the kidneys bilaterally. Subcentimeter low-attenuation lesion in the lateral aspect of the interpolar region of the right kidney is too small to definitively characterize, but likely to represent a cyst. No hydroureteronephrosis. Urinary bladder is normal in appearance. Bilateral adrenal glands are normal in appearance. Stomach/Bowel: Normal appearance of the stomach.  No pathologic dilatation of small bowel or colon. Normal appendix. Vascular/Lymphatic: Aortic atherosclerosis, without evidence of aneurysm or dissection in the abdominal or pelvic vasculature. Vascular findings and measurements pertinent to potential TAVR procedure, as detailed below. Celiac axis, superior mesenteric artery and inferior mesenteric artery all appear widely patent without definite hemodynamically significant stenosis. Single renal arteries bilaterally also appear widely patent without hemodynamically significant stenosis. No lymphadenopathy identified in the abdomen or pelvis. Reproductive: Status post hysterectomy. Ovaries are not confidently identified may be surgically absent or atrophic. Other: No significant volume of ascites.  No pneumoperitoneum. Musculoskeletal: There are no aggressive appearing lytic or blastic lesions noted in the visualized portions of the skeleton. VASCULAR MEASUREMENTS PERTINENT TO TAVR: AORTA: Minimal Aortic Diameter -  12 x 13 mm Severity of Aortic Calcification -  severe RIGHT PELVIS: Right Common Iliac Artery - Minimal Diameter - 7.9 x 8.7 mm Tortuosity - mild Calcification - moderate Right External Iliac Artery - Minimal Diameter - 6.4 x 6.4 mm Tortuosity - mild Calcification - minimal Right Common Femoral Artery - Minimal Diameter - 5.8 x 6.0 mm Tortuosity - mild Calcification - mild LEFT PELVIS: Left Common Iliac Artery - Minimal Diameter - 8.5 x 8.3 mm Tortuosity - mild Calcification - moderate Left External Iliac Artery - Minimal Diameter - 6.7 x 6.3 mm Tortuosity -  mild Calcification - minimal Left Common Femoral Artery - Minimal Diameter - 5.9 x 5.9 mm Tortuosity - mild Calcification - mild Review of the MIP images confirms the above findings. IMPRESSION: 1. Vascular findings and measurements pertinent to potential TAVR procedure, as detailed above. This patient does appear to have suitable pelvic arterial access bilaterally. 2. Mild thickening and  calcification of the aortic valve. 3. Cardiomegaly with biatrial dilatation. Notably, there appears to be a small filling defect in the tip of the left atrial appendage. Further evaluation with transesophageal echocardiography is suggested in the near future (if clinically appropriate) to evaluate for potential left atrial appendage thrombus as this places the patient at risk for systemic embolization. 4. Small right partially loculated pleural effusion, with some fluid in the right major fissure. 5. Multiple small pulmonary nodules in the right lung measuring 4 mm or less in size, nonspecific, but strongly favored to be benign. No follow-up needed if patient is low-risk (and has no known or suspected primary neoplasm). Non-contrast chest CT can be considered in 12 months if patient is high-risk. This recommendation follows the consensus statement: Guidelines for Management of Incidental Pulmonary Nodules Detected on CT Images: From the Fleischner Society 2017; Radiology 2017; 284:228-243. 6. Additional incidental findings, as above. Aortic Atherosclerosis (ICD10-I70.0). Electronically Signed   By: Trudie Reed M.D.   On: 03/16/2017 08:56        Scheduled Meds: . calcitRIOL  0.25 mcg Oral Daily  . cholecalciferol  1,000 Units Oral Daily  . feeding supplement (GLUCERNA SHAKE)  237 mL Oral BID BM  . furosemide  40 mg Oral BID  . heparin  3,000 Units Intravenous Once  . insulin aspart  0-5 Units Subcutaneous QHS  . insulin aspart  0-9 Units Subcutaneous TID WC  . insulin glargine  5 Units Subcutaneous Daily  . lisinopril  5 mg Oral Daily  . metoprolol succinate  75 mg Oral BID  . potassium chloride  40 mEq Oral Daily  . QUEtiapine  25 mg Oral QHS  . rosuvastatin  10 mg Oral q1800  . sodium chloride flush  3 mL Intravenous Q12H  . vitamin B-12  1,000 mcg Oral Daily  . warfarin  5 mg Oral ONCE-1800  . Warfarin - Pharmacist Dosing Inpatient   Does not apply q1800   Continuous Infusions: .  sodium chloride    . heparin       LOS: 4 days    Elle Vezina Jaynie Collins, MD [facility] Pager [phone]  If 7PM-7AM, please contact night-coverage [email or web address] Password [facility] 03/16/2017, 12:39 PM

## 2017-03-16 NOTE — Progress Notes (Signed)
ANTICOAGULATION CONSULT NOTE - Follow Up Consult  Pharmacy Consult for Heparin and Coumadin Indication: atrial fibrillation and possible LV thrombus  No Known Allergies  Patient Measurements: Height: 5\' 3"  (160 cm) Weight: 135 lb 11.2 oz (61.6 kg) IBW/kg (Calculated) : 52.4 Heparin Dosing Weight: 61.6 kg  Vital Signs: Temp: 97.6 F (36.4 C) (08/30 0418) Temp Source: Oral (08/30 0418) BP: 123/59 (08/30 1047) Pulse Rate: 73 (08/30 1047)  Labs:  Recent Labs  03/14/17 0546 03/15/17 0628 03/16/17 0358  HGB  --  12.9  --   HCT  --  41.5  --   PLT  --  200  --   LABPROT 16.7* 14.8 15.0  INR 1.35 1.16 1.19  CREATININE 1.20* 1.07* 1.13*    Estimated Creatinine Clearance: 28.5 mL/min (A) (by C-G formula based on SCr of 1.13 mg/dL (H)).  Assessment:   83 yr old female on Coumadin prior to admission for afib.  Coumadin held for cath, which had to be cancelled on 8/28 due to confusion.   Coumadin resumed on 8/29 with 5 mg x 1.   INR 1.19 today.  Now to add heparin bridge for possible LV thrombus per CT of chest.     Home Coumadin regimen: 4 mg daily except 2 mg on Wed and Thurs.    INR 1.76 on admit 8/25.  Goal of Therapy:  INR 2-3 Heparin level 0.3-0.7 units/ml Monitor platelets by anticoagulation protocol: Yes   Plan:   Heparin 3000 units IV x 1  Heparin drip to begin at 850 units/hr  Heparin level ~8hr after drip begins.  Coumadin 5 mg again today.  Daily heparin level, PT/INR and CBC.  Dennie Fetters, RPh Pager: [phone], or [phone] 03/16/2017,11:59 AM

## 2017-03-16 NOTE — Plan of Care (Signed)
Problem: Activity: Goal: Risk for activity intolerance will decrease Outcome: Progressing Pt calls for assistance out of bed to bedside commode.

## 2017-03-16 NOTE — Progress Notes (Signed)
ANTICOAGULATION CONSULT NOTE - Follow Up Consult  Pharmacy Consult for Heparin Indication: atrial fibrillation and possible LV thrombus  No Known Allergies  Patient Measurements: Height: 5\' 3"  (160 cm) Weight: 135 lb 11.2 oz (61.6 kg) IBW/kg (Calculated) : 52.4 Heparin Dosing Weight: 61.6 kg  Vital Signs: Temp: 97.4 F (36.3 C) (08/30 2025) Temp Source: Oral (08/30 2025) BP: 132/91 (08/30 2025) Pulse Rate: 90 (08/30 2025)  Labs:  Recent Labs  03/14/17 0546 03/15/17 0628 03/16/17 0358 03/16/17 1927  HGB  --  12.9  --   --   HCT  --  41.5  --   --   PLT  --  200  --   --   LABPROT 16.7* 14.8 15.0  --   INR 1.35 1.16 1.19  --   HEPARINUNFRC  --   --   --  0.69  CREATININE 1.20* 1.07* 1.13*  --     Estimated Creatinine Clearance: 28.5 mL/min (A) (by C-G formula based on SCr of 1.13 mg/dL (H)).  Assessment:   83 yr old female on Coumadin prior to admission for afib.  Coumadin held for cath, which had to be cancelled on 8/28 due to confusion.   Coumadin resumed on 8/29 with 5 mg x 1.   INR 1.19 today.  Now to add heparin bridge for possible LV thrombus per CT of chest.     Home Coumadin regimen: 4 mg daily except 2 mg on Wed and Thurs.    INR 1.76 on admit 8/25.  PM f/u > heparin level 0.69, at higher end of goal range   Goal of Therapy:  INR 2-3 Heparin level 0.3-0.7 units/ml Monitor platelets by anticoagulation protocol: Yes   Plan:  Decrease IV Heparin to 800 units/hr Recheck heparin level in 8 hrs. Daily heparin level and CBC.  Tad Moore, BCPS  Clinical Pharmacist Pager [phone]  03/16/2017 8:38 PM

## 2017-03-16 NOTE — Evaluation (Signed)
Occupational Therapy Evaluation Patient Details Name: [name] MRN: [id number] DOB: Jan 20, 1927 Today's Date: 03/16/2017    History of Present Illness Pt is an 83 yo female with admitted with complaints of increasing dyspnea over the last week dx with congestive heart failure. Currently, preparing for TAVR. PMH for type 2 diabetes, hypertension, CHF (EF45% -   50%),  Pafib () Moderate Aortic stenosis , Mild MR, moderate pulm hypertension   Clinical Impression   Pt admitted with above. She demonstrates the below listed deficits and will benefit from continued OT to maximize safety and independence with BADLs.  Pt presents to OT with generalized weakness, decreased activity tolerance, impaired balance.  She requires mod A for ADLs.  DOE 3/4 with donning socks EOB, and with ambulating to BR- unable to tolerate further activity due to fatigue.  HR to 102.  Will follow.       Follow Up Recommendations  Home health OT;([facility] )    Equipment Recommendations  None recommended by OT    Recommendations for Other Services       Precautions / Restrictions Precautions Precautions: Fall      Mobility Bed Mobility Overal bed mobility: Needs Assistance Bed Mobility: Supine to Sit;Sit to Supine     Supine to sit: Min assist Sit to supine: Min assist   General bed mobility comments: assist to lift trunk and assist for LEs   Transfers Overall transfer level: Needs assistance Equipment used: Rolling walker (2 wheeled) Transfers: Sit to/from [employer] Sit to Stand: Min guard;Min assist Stand pivot transfers: Min assist;Min guard       General transfer comment: min guard assist from bed, min A from commode.  She required min guard initially with transfers, but min A for walker management as she fatigued     Balance Overall balance assessment: Needs assistance Sitting-balance support: Feet supported Sitting balance-Leahy Scale: Good     Standing balance  support: During functional activity;Single extremity supported Standing balance-Leahy Scale: Poor Standing balance comment: required UE support and/or min a                           ADL either performed or assessed with clinical judgement   ADL Overall ADL's : Needs assistance/impaired Eating/Feeding: Independent   Grooming: Wash/dry hands;Wash/dry face;Oral care;Set up;Sitting   Upper Body Bathing: Minimal assistance;Sitting   Lower Body Bathing: Moderate assistance;Sit to/from stand   Upper Body Dressing : Minimal assistance;Sitting   Lower Body Dressing: Minimal assistance;Sit to/from stand   Toilet Transfer: Minimal assistance;Ambulation;Comfort height toilet;Grab bars;RW   Toileting- Clothing Manipulation and Hygiene: Moderate assistance;Sit to/from stand Toileting - Clothing Manipulation Details (indicate cue type and reason): Pt too fatigued to complete task      Functional mobility during ADLs: Minimal assistance;Min guard;Rolling walker General ADL Comments: Pt with DOE 3/4- 4/4.  Pt ambulated to BR, but became too fatigued to complete toileting task, and was assisted back to bed      Vision         Perception     Praxis      Pertinent Vitals/Pain Pain Assessment: No/denies pain     Hand Dominance     Extremity/Trunk Assessment Upper Extremity Assessment Upper Extremity Assessment: Overall WFL for tasks assessed (strength grossly 4+/5 - 5/5 )   Lower Extremity Assessment Lower Extremity Assessment: Defer to PT evaluation   Cervical / Trunk Assessment Cervical / Trunk Assessment: Kyphotic   Communication Communication Communication:  No difficulties   Cognition Arousal/Alertness: Awake/alert Behavior During Therapy: WFL for tasks assessed/performed Overall Cognitive Status: History of cognitive impairments - at baseline                                 General Comments: Pt with decreased recall.  requires mod cues for  problem solving    General Comments  HR to 102.  DOE 3/4.  Pt with LV thrombus on CT scan and subtherapeutic INR.  Spoke with cardiology PA, who ok'd OT to work with pt     Exercises     Shoulder Instructions      Home Living Family/patient expects to be discharged to:: Private residence Living Arrangements: Children Available Help at Discharge: Family;Available 24 hours/day Type of Home: House Home Access: Ramped entrance     Home Layout: One level     Bathroom Shower/Tub: Chief Strategy Officer: Handicapped height Bathroom Accessibility: Yes   Home Equipment: Environmental consultant - 2 wheels;Tub bench;Bedside commode;Grab bars - tub/shower;Grab bars - toilet;Wheelchair - manual          Prior Functioning/Environment Level of Independence: Needs assistance  Gait / Transfers Assistance Needed: Pt ambulates with RW ADL's / Homemaking Assistance Needed: Pt takes sponge bathes.  She occasionally requires assist for clean up due to urinary incontinence.  Daughter performs IADLs             OT Problem List: Decreased strength;Decreased activity tolerance;Impaired balance (sitting and/or standing);Decreased safety awareness;Decreased knowledge of use of DME or AE;Cardiopulmonary status limiting activity      OT Treatment/Interventions: Self-care/ADL training;Energy conservation;DME and/or AE instruction;Therapeutic activities;Cognitive remediation/compensation;Patient/family education;Balance training    OT Goals(Current goals can be found in the care plan section) Acute Rehab OT Goals Patient Stated Goal: go home OT Goal Formulation: With patient/family Time For Goal Achievement: 03/30/17 Potential to Achieve Goals: Fair ADL Goals Pt Will Perform Upper Body Bathing: with set-up;with supervision;sitting Pt Will Perform Lower Body Bathing: with min guard assist;sit to/from stand Pt Will Perform Upper Body Dressing: with supervision;with set-up;sitting Pt Will Perform Lower  Body Dressing: with min guard assist;sit to/from stand Pt Will Transfer to Toilet: with min guard assist;ambulating;regular height toilet;bedside commode;grab bars Pt Will Perform Toileting - Clothing Manipulation and hygiene: with min guard assist;sit to/from stand  OT Frequency: Min 2X/week   Barriers to D/C:            Co-evaluation              AM-PAC PT "6 Clicks" Daily Activity     Outcome Measure Help from another person eating meals?: None Help from another person taking care of personal grooming?: A Little Help from another person toileting, which includes using toliet, bedpan, or urinal?: A Lot Help from another person bathing (including washing, rinsing, drying)?: A Lot Help from another person to put on and taking off regular upper body clothing?: A Little Help from another person to put on and taking off regular lower body clothing?: A Lot 6 Click Score: 16   End of Session Equipment Utilized During Treatment: Rolling walker Nurse Communication: Mobility status  Activity Tolerance: Patient limited by fatigue Patient left: in bed;with call bell/phone within reach;with nursing/sitter in room  OT Visit Diagnosis: Unsteadiness on feet (R26.81)                Time: 3276-1470 OT Time Calculation (min): 32 min Charges:  OT General  Charges $OT Visit: 1 Visit OT Evaluation $OT Eval Moderate Complexity: 1 Mod OT Treatments $Self Care/Home Management : 8-22 mins G-Codes:     [employer], OTR/L [phone]   Jeani Hawking M 03/16/2017, 12:57 PM

## 2017-03-16 NOTE — Progress Notes (Signed)
Progress Note  Patient Name: [name] Date of Encounter: 03/16/2017  Primary Cardiologist: Dr. Graciela Husbands   Subjective   No chest pain or dyspnea.   Inpatient Medications    Scheduled Meds: . calcitRIOL  0.25 mcg Oral Daily  . cholecalciferol  1,000 Units Oral Daily  . feeding supplement (GLUCERNA SHAKE)  237 mL Oral BID BM  . furosemide  40 mg Oral BID  . insulin aspart  0-5 Units Subcutaneous QHS  . insulin aspart  0-9 Units Subcutaneous TID WC  . insulin glargine  5 Units Subcutaneous Daily  . lisinopril  5 mg Oral Daily  . metoprolol succinate  75 mg Oral BID  . potassium chloride  40 mEq Oral Daily  . QUEtiapine  25 mg Oral QHS  . rosuvastatin  10 mg Oral q1800  . sodium chloride flush  3 mL Intravenous Q12H  . vitamin B-12  1,000 mcg Oral Daily  . Warfarin - Pharmacist Dosing Inpatient   Does not apply q1800   Continuous Infusions: . sodium chloride     PRN Meds: sodium chloride, acetaminophen **OR** acetaminophen, hydrALAZINE, LORazepam, sodium chloride flush   Vital Signs    Vitals:   03/15/17 1714 03/15/17 2100 03/16/17 0418 03/16/17 1047  BP: 133/83 107/73 119/84 (!) 123/59  Pulse: 90 88 89 73  Resp:  18 18   Temp:  97.7 F (36.5 C) 97.6 F (36.4 C)   TempSrc:  Oral Oral   SpO2: 95% 100% 100% 99%  Weight:   135 lb 11.2 oz (61.6 kg)   Height:        Intake/Output Summary (Last 24 hours) at 03/16/17 1135 Last data filed at 03/16/17 0957  Gross per 24 hour  Intake              540 ml  Output             1050 ml  Net             -510 ml   Filed Weights   03/14/17 0600 03/15/17 0655 03/16/17 0418  Weight: 151 lb (68.5 kg) 148 lb (67.1 kg) 135 lb 11.2 oz (61.6 kg)    Telemetry    afib at rate of 70s - Personally Reviewed  ECG    None today   Physical Exam   GEN: Elderly female in no acute distress.   Neck: No JVD Cardiac: IR IR , systolic murmurs, rubs, or gallops.  Respiratory: Clear to auscultation bilaterally. GI: Soft,  nontender, non-distended  MS: No edema; No deformity. Neuro:  Nonfocal  Psych: Normal affect   Labs    Chemistry Recent Labs Lab 03/12/17 0258  03/14/17 0546 03/15/17 0628 03/16/17 0358  NA 145  < > 142 145 140  K 3.3*  < > 3.6 3.4* 3.8  CL 111  < > 108 109 109  CO2 27  < > 24 26 26   GLUCOSE 161*  < > 214* 145* 142*  BUN 16  < > 21* 14 20  CREATININE 1.01*  < > 1.20* 1.07* 1.13*  CALCIUM 10.0  < > 10.0 10.2 10.3  PROT 6.6  --   --   --   --   ALBUMIN 3.3*  --   --   --   --   AST 19  --   --   --   --   ALT 25  --   --   --   --   [facility]  59  --   --   --   --   BILITOT 1.2  --   --   --   --   GFRNONAA 48*  < > 39* 45* 42*  GFRAA 56*  < > 45* 52* 49*  ANIONGAP 7  < > 10 10 5   < > = values in this interval not displayed.   Hematology Recent Labs Lab 03/12/17 0258 03/12/17 1412 03/13/17 0434 03/15/17 0628  WBC 6.1  --  6.4 5.4  RBC 4.19  --  4.27 4.35  HGB 12.5  --  12.9 12.9  HCT 39.2 39.9 40.4 41.5  MCV 93.6  --  94.6 95.4  MCH 29.8  --  30.2 29.7  MCHC 31.9  --  31.9 31.1  RDW 14.8  --  14.7 14.7  PLT 169  --  178 200    Cardiac Enzymes Recent Labs Lab 03/11/17 2143 03/12/17 0258 03/12/17 0925  TROPONINI 0.10* 0.12* 0.12*    Recent Labs Lab 03/11/17 1638  TROPIPOC 0.18*     BNP Recent Labs Lab 03/11/17 1616  BNP 1,286.5*     DDimer No results for input(s): DDIMER in the last 168 hours.   Radiology    Ct Coronary Morph W/cta Cor W/score W/ca W/cm &/or Wo/cm  Addendum Date: 03/16/2017   ADDENDUM REPORT: 03/16/2017 08:09 EXAM: OVER-READ INTERPRETATION  CT CHEST The following report is an over-read performed by radiologist Dr. Royal Piedra [facility] on 03/16/2017. This over-read does not include interpretation of cardiac or coronary anatomy or pathology. The cardiac CTA interpretation by the cardiologist is attached. COMPARISON:  None. FINDINGS: Extracardiac findings will be described separately under dictation for  contemporaneously obtained CTA of the chest, abdomen and pelvis. IMPRESSION: Please see separate dictation for contemporaneously obtained CTA of the chest, abdomen and pelvis 03/16/2015 for full description of relevant extracardiac findings. Electronically Signed   By: Trudie Reed M.D.   On: 03/16/2017 08:09   Result Date: 03/16/2017 CLINICAL DATA:  Aortic Stenosis EXAM: Cardiac TAVR CT TECHNIQUE: The patient was scanned on a Siemens Force 192 slice scanner. A 120 kV retrospective scan was triggered in the ascending thoracic aorta at 140 HU's. Gantry rotation speed was 250 msecs and collimation was .6 mm. No beta blockade or nitro were given. The 3D data set was reconstructed in 5% intervals of the R-R cycle. Systolic and diastolic phases were analyzed on a dedicated work station using MPR, MIP and VRT modes. The patient received 80 cc of contrast. FINDINGS: Aortic Valve: Tri leaflet thickened and calcified with fusion of right and non coronary cusps Aorta: Significant calcified aortic atherosclerosis and soft plaque in the ascending thoracic aorta and arch. Some aneurysmal dilatation of the distal arch Sino-tubular Junction:  24 mm Ascending Thoracic Aorta:  34 mm Aortic Arch:  36 mm distal aneurysmal segment Descending Thoracic Aorta:  24 mm Sinus of Valsalva Measurements: Non-coronary:  27.9 mm Right - coronary:  27.5 mm Left -   coronary:  27.9 mm Coronary Artery Height above Annulus: Left Main:  Some what shallow 10.3 mm Right Coronary:  Some what shallow 10.8 mm Virtual Basal Annulus Measurements: Maximum / Minimum Diameter:  25.8 mm x 20.1 mm Perimeter:  73.2 mm Area:  405 mm 2 Coronary Arteries: Sufficient height above annulus for Sapien 3 delivery Optimum Fluoroscopic Angle for Delivery: LAO 15 degrees Caudal 13 degrees IMPRESSION: 1) Calcified tri leaflet aortic valve with fusion of right and non coronary cusp and  annulus with perimeter 73.2 mm area 405 mm2 2) LAA thrombus appears present no delayed  images performed consider TEE confirmation 3) Optimum angiographic angle for delivery LAO 15 degrees Caudal 13 degrees 4) Coronary height sufficient for Sapien 3 delivery Area measurement suitable for a 23 mm Sapien 3 valve Perimeter measurement along with shallow coronary and sinus less than 29 mm suggests suitable for a 26 mm Evolut Pro [employer] Electronically Signed: By: Charlton Haws M.D. On: 03/15/2017 18:18   Ct Angio Chest Aorta W/cm &/or Wo/cm  Result Date: 03/16/2017 CLINICAL DATA:  83 year old female with history of severe aortic stenosis. Preprocedural study prior to potential transcatheter aortic valve replacement (TAVR) procedure. EXAM: CT ANGIOGRAPHY CHEST, ABDOMEN AND PELVIS TECHNIQUE: Multidetector CT imaging through the chest, abdomen and pelvis was performed using the standard protocol during bolus administration of intravenous contrast. Multiplanar reconstructed images and MIPs were obtained and reviewed to evaluate the vascular anatomy. CONTRAST:  120 mL of Isovue 370. COMPARISON:  Chest CT 07/25/2005. FINDINGS: CTA CHEST FINDINGS Cardiovascular: Heart size is enlarged with biatrial dilatation. There is no significant pericardial fluid, thickening or pericardial calcification. There is aortic atherosclerosis, as well as atherosclerosis of the great vessels of the mediastinum and the coronary arteries, including calcified atherosclerotic plaque in the left main, left anterior descending, left circumflex and right coronary arteries. Left-sided biventricular pacemaker with lead tips terminating in the right ventricular apex and overlying the lateral wall the left ventricle via the coronary sinus and coronary veins. Some mild thickening and calcification of the aortic valve. Tiny filling defect in the tip of the left atrial appendage (axial image 38 of series 14), suspicious for small volume of left atrial appendage thrombus. Mediastinum/Lymph Nodes: No pathologically enlarged mediastinal or  hilar lymph nodes. Esophagus is unremarkable in appearance. No axillary lymphadenopathy. Lungs/Pleura: Small cluster of peribronchovascular micronodularity in the posterior aspect of the right upper lobe near the apex, presumably areas of mucoid impaction within terminal bronchioles. 4 mm pulmonary nodule in the right middle lobe (axial image 40 of series 15). Trace right pleural effusion partially loculated in the right major fissure. No left pleural effusion. No definite acute consolidative airspace disease. Musculoskeletal/Soft Tissues: There are no aggressive appearing lytic or blastic lesions noted in the visualized portions of the skeleton. CTA ABDOMEN AND PELVIS FINDINGS Hepatobiliary: No cystic or solid hepatic lesions. No intra or extrahepatic biliary ductal dilatation. Gallbladder is normal in appearance. Pancreas: No pancreatic mass. No pancreatic ductal dilatation. No pancreatic or peripancreatic fluid or inflammatory changes. Spleen: Unremarkable. Adrenals/Urinary Tract: Multifocal cortical thinning in the kidneys bilaterally. Subcentimeter low-attenuation lesion in the lateral aspect of the interpolar region of the right kidney is too small to definitively characterize, but likely to represent a cyst. No hydroureteronephrosis. Urinary bladder is normal in appearance. Bilateral adrenal glands are normal in appearance. Stomach/Bowel: Normal appearance of the stomach. No pathologic dilatation of small bowel or colon. Normal appendix. Vascular/Lymphatic: Aortic atherosclerosis, without evidence of aneurysm or dissection in the abdominal or pelvic vasculature. Vascular findings and measurements pertinent to potential TAVR procedure, as detailed below. Celiac axis, superior mesenteric artery and inferior mesenteric artery all appear widely patent without definite hemodynamically significant stenosis. Single renal arteries bilaterally also appear widely patent without hemodynamically significant stenosis. No  lymphadenopathy identified in the abdomen or pelvis. Reproductive: Status post hysterectomy. Ovaries are not confidently identified may be surgically absent or atrophic. Other: No significant volume of ascites.  No pneumoperitoneum. Musculoskeletal: There are no aggressive appearing lytic or  blastic lesions noted in the visualized portions of the skeleton. VASCULAR MEASUREMENTS PERTINENT TO TAVR: AORTA: Minimal Aortic Diameter -  12 x 13 mm Severity of Aortic Calcification -  severe RIGHT PELVIS: Right Common Iliac Artery - Minimal Diameter - 7.9 x 8.7 mm Tortuosity - mild Calcification - moderate Right External Iliac Artery - Minimal Diameter - 6.4 x 6.4 mm Tortuosity - mild Calcification - minimal Right Common Femoral Artery - Minimal Diameter - 5.8 x 6.0 mm Tortuosity - mild Calcification - mild LEFT PELVIS: Left Common Iliac Artery - Minimal Diameter - 8.5 x 8.3 mm Tortuosity - mild Calcification - moderate Left External Iliac Artery - Minimal Diameter - 6.7 x 6.3 mm Tortuosity - mild Calcification - minimal Left Common Femoral Artery - Minimal Diameter - 5.9 x 5.9 mm Tortuosity - mild Calcification - mild Review of the MIP images confirms the above findings. IMPRESSION: 1. Vascular findings and measurements pertinent to potential TAVR procedure, as detailed above. This patient does appear to have suitable pelvic arterial access bilaterally. 2. Mild thickening and calcification of the aortic valve. 3. Cardiomegaly with biatrial dilatation. Notably, there appears to be a small filling defect in the tip of the left atrial appendage. Further evaluation with transesophageal echocardiography is suggested in the near future (if clinically appropriate) to evaluate for potential left atrial appendage thrombus as this places the patient at risk for systemic embolization. 4. Small right partially loculated pleural effusion, with some fluid in the right major fissure. 5. Multiple small pulmonary nodules in the right lung  measuring 4 mm or less in size, nonspecific, but strongly favored to be benign. No follow-up needed if patient is low-risk (and has no known or suspected primary neoplasm). Non-contrast chest CT can be considered in 12 months if patient is high-risk. This recommendation follows the consensus statement: Guidelines for Management of Incidental Pulmonary Nodules Detected on CT Images: From the Fleischner Society 2017; Radiology 2017; 284:228-243. 6. Additional incidental findings, as above. Aortic Atherosclerosis (ICD10-I70.0). Electronically Signed   By: Trudie Reed M.D.   On: 03/16/2017 08:56   Ct Angio Abd/pel W/ And/or W/o  Result Date: 03/16/2017 CLINICAL DATA:  83 year old female with history of severe aortic stenosis. Preprocedural study prior to potential transcatheter aortic valve replacement (TAVR) procedure. EXAM: CT ANGIOGRAPHY CHEST, ABDOMEN AND PELVIS TECHNIQUE: Multidetector CT imaging through the chest, abdomen and pelvis was performed using the standard protocol during bolus administration of intravenous contrast. Multiplanar reconstructed images and MIPs were obtained and reviewed to evaluate the vascular anatomy. CONTRAST:  120 mL of Isovue 370. COMPARISON:  Chest CT 07/25/2005. FINDINGS: CTA CHEST FINDINGS Cardiovascular: Heart size is enlarged with biatrial dilatation. There is no significant pericardial fluid, thickening or pericardial calcification. There is aortic atherosclerosis, as well as atherosclerosis of the great vessels of the mediastinum and the coronary arteries, including calcified atherosclerotic plaque in the left main, left anterior descending, left circumflex and right coronary arteries. Left-sided biventricular pacemaker with lead tips terminating in the right ventricular apex and overlying the lateral wall the left ventricle via the coronary sinus and coronary veins. Some mild thickening and calcification of the aortic valve. Tiny filling defect in the tip of the left  atrial appendage (axial image 38 of series 14), suspicious for small volume of left atrial appendage thrombus. Mediastinum/Lymph Nodes: No pathologically enlarged mediastinal or hilar lymph nodes. Esophagus is unremarkable in appearance. No axillary lymphadenopathy. Lungs/Pleura: Small cluster of peribronchovascular micronodularity in the posterior aspect of the right upper lobe near  the apex, presumably areas of mucoid impaction within terminal bronchioles. 4 mm pulmonary nodule in the right middle lobe (axial image 40 of series 15). Trace right pleural effusion partially loculated in the right major fissure. No left pleural effusion. No definite acute consolidative airspace disease. Musculoskeletal/Soft Tissues: There are no aggressive appearing lytic or blastic lesions noted in the visualized portions of the skeleton. CTA ABDOMEN AND PELVIS FINDINGS Hepatobiliary: No cystic or solid hepatic lesions. No intra or extrahepatic biliary ductal dilatation. Gallbladder is normal in appearance. Pancreas: No pancreatic mass. No pancreatic ductal dilatation. No pancreatic or peripancreatic fluid or inflammatory changes. Spleen: Unremarkable. Adrenals/Urinary Tract: Multifocal cortical thinning in the kidneys bilaterally. Subcentimeter low-attenuation lesion in the lateral aspect of the interpolar region of the right kidney is too small to definitively characterize, but likely to represent a cyst. No hydroureteronephrosis. Urinary bladder is normal in appearance. Bilateral adrenal glands are normal in appearance. Stomach/Bowel: Normal appearance of the stomach. No pathologic dilatation of small bowel or colon. Normal appendix. Vascular/Lymphatic: Aortic atherosclerosis, without evidence of aneurysm or dissection in the abdominal or pelvic vasculature. Vascular findings and measurements pertinent to potential TAVR procedure, as detailed below. Celiac axis, superior mesenteric artery and inferior mesenteric artery all appear  widely patent without definite hemodynamically significant stenosis. Single renal arteries bilaterally also appear widely patent without hemodynamically significant stenosis. No lymphadenopathy identified in the abdomen or pelvis. Reproductive: Status post hysterectomy. Ovaries are not confidently identified may be surgically absent or atrophic. Other: No significant volume of ascites.  No pneumoperitoneum. Musculoskeletal: There are no aggressive appearing lytic or blastic lesions noted in the visualized portions of the skeleton. VASCULAR MEASUREMENTS PERTINENT TO TAVR: AORTA: Minimal Aortic Diameter -  12 x 13 mm Severity of Aortic Calcification -  severe RIGHT PELVIS: Right Common Iliac Artery - Minimal Diameter - 7.9 x 8.7 mm Tortuosity - mild Calcification - moderate Right External Iliac Artery - Minimal Diameter - 6.4 x 6.4 mm Tortuosity - mild Calcification - minimal Right Common Femoral Artery - Minimal Diameter - 5.8 x 6.0 mm Tortuosity - mild Calcification - mild LEFT PELVIS: Left Common Iliac Artery - Minimal Diameter - 8.5 x 8.3 mm Tortuosity - mild Calcification - moderate Left External Iliac Artery - Minimal Diameter - 6.7 x 6.3 mm Tortuosity - mild Calcification - minimal Left Common Femoral Artery - Minimal Diameter - 5.9 x 5.9 mm Tortuosity - mild Calcification - mild Review of the MIP images confirms the above findings. IMPRESSION: 1. Vascular findings and measurements pertinent to potential TAVR procedure, as detailed above. This patient does appear to have suitable pelvic arterial access bilaterally. 2. Mild thickening and calcification of the aortic valve. 3. Cardiomegaly with biatrial dilatation. Notably, there appears to be a small filling defect in the tip of the left atrial appendage. Further evaluation with transesophageal echocardiography is suggested in the near future (if clinically appropriate) to evaluate for potential left atrial appendage thrombus as this places the patient at risk  for systemic embolization. 4. Small right partially loculated pleural effusion, with some fluid in the right major fissure. 5. Multiple small pulmonary nodules in the right lung measuring 4 mm or less in size, nonspecific, but strongly favored to be benign. No follow-up needed if patient is low-risk (and has no known or suspected primary neoplasm). Non-contrast chest CT can be considered in 12 months if patient is high-risk. This recommendation follows the consensus statement: Guidelines for Management of Incidental Pulmonary Nodules Detected on CT Images: From the Fleischner  Society 2017; Radiology 2017; 284:228-243. 6. Additional incidental findings, as above. Aortic Atherosclerosis (ICD10-I70.0). Electronically Signed   By: Trudie Reed M.D.   On: 03/16/2017 08:56    Cardiac Studies   Echo 03/13/17 Left ventricle: The cavity size was normal. There was severe concentric hypertrophy. Systolic function was moderately reduced. The estimated ejection fraction was in the range of 35% to 40%. Wall motion was normal; there were no regional wall motion abnormalities. The study was not technically sufficient to allow evaluation of LV diastolic dysfunction due to atrial fibrillation. - Aortic valve: Valve mobility was restricted. Mean gradient (S): 43 mm Hg. Peak gradient (S): 75 mm Hg. Valve area (VTI): 0.45 cm^2. Valve area (Vmax): 0.46 cm^2. Valve area (Vmean): 0.42 cm^2. - Mitral valve: Calcified annulus. Mildly thickened leaflets . There was mild regurgitation. - Left atrium: The atrium was moderately dilated. - Right ventricle: The cavity size was mildly dilated. Wall thickness was normal. Systolic function was mildly reduced. - Right atrium: The atrium was moderately dilated. - Tricuspid valve: There was moderate regurgitation. - Pulmonary arteries: Systolic pressure was moderately increased. PA peak pressure: 47 mm Hg (S).  Impressions:  - When compared to  the prior study from 09/29/2014 LVEF has decreased from 45-50% to 35-40% and aortic stenosis is now severe. Moderate pulmonary hypertension.   Patient Profile     83 y.o. female with hx of diabetes mellitus, hyperlipidemia, permanent atrial fibrillation, pacemaker for tachycardia-bradycardia syndrome, chronic kidney disease stage III here with acute on chronic heart failure due to moderate left ventricular dysfunction and critical aortic stenosis.   Assessment & Plan    1. Acute on chronic systolic CHF due to critical AS and pHTN - net I & O negative 760cc. Total weight down 15lb (13lb overnight). Likely inaccurate.  Volume status stable. Continue current dose of lasix at 40mg  BID.   2. Severe AS - L and R cath cancelled due to confusion. Undergoing TVAR work up--> outpatient follow up in valve clinic.   3. Hypokalemia - resolved.   4. NSVT x 6 beats  - No recoccurance  5. permanent atrial fibrillation - rate controlled. Coumadin resumed--> per pharmacy. INR of 1.19 today. With start heparin bridge given possible thrombus on CTA.   6. Possible LV thrombus on CT of chest - Start Heparin bridge in addition of coumadin  Signed, Bhagat,Bhavinkumar, PA  03/16/2017, 11:35 AM    I have seen and examined the patient along with Bhagat,Bhavinkumar, PA.  I have reviewed the chart, notes and new data.  I agree with PA/NP's note.  Key new complaints: no dyspnea at rest Key examination changes: appears clinically euvolemic. Key new findings / data: CT reviewed - good pelvic artery acces for TAVR. Questionable small apical LA appendage thrombus  PLAN: Adequate rate control on increased beta blocker, off diltiazem. Continue current furosemide dose. This is what she was taking at home, so the difference may be improved compliance with sodium restriction on hospital diet.  Monitor weight carefully after she returns home. With possible LAA thrombus will plan to bridge with heparin while  we wait for warfarin to reach therapeutic levels. Bring back to valve clinic to discuss further TAVR workup.  Thurmon Fair, MD, [facility] [phone] 03/16/2017, 12:54 PM

## 2017-03-17 DIAGNOSIS — I5021 Acute systolic (congestive) heart failure: Secondary | ICD-10-CM

## 2017-03-17 LAB — GLUCOSE, CAPILLARY
GLUCOSE-CAPILLARY: 144 mg/dL — AB (ref 65–99)
GLUCOSE-CAPILLARY: 303 mg/dL — AB (ref 65–99)
GLUCOSE-CAPILLARY: 237 mg/dL — AB (ref 65–99)
Glucose-Capillary: 237 mg/dL — ABNORMAL HIGH (ref 65–99)

## 2017-03-17 LAB — BASIC METABOLIC PANEL
Anion gap: 6 (ref 5–15)
BUN: 20 mg/dL (ref 6–20)
CHLORIDE: 109 mmol/L (ref 101–111)
CO2: 26 mmol/L (ref 22–32)
Calcium: 10.3 mg/dL (ref 8.9–10.3)
Creatinine, Ser: 1.27 mg/dL — ABNORMAL HIGH (ref 0.44–1.00)
GFR calc non Af Amer: 37 mL/min — ABNORMAL LOW (ref >60)
GFR, EST AFRICAN AMERICAN: 42 mL/min — AB (ref >60)
Glucose, Bld: 164 mg/dL — ABNORMAL HIGH (ref 65–99)
Potassium: 3.5 mmol/L (ref 3.5–5.1)
Sodium: 141 mmol/L (ref 135–145)

## 2017-03-17 LAB — CBC
HEMATOCRIT: 41.3 % (ref 36.0–46.0)
Hemoglobin: 12.8 g/dL (ref 12.0–15.0)
MCH: 29.4 pg (ref 26.0–34.0)
MCHC: 31 g/dL (ref 30.0–36.0)
MCV: 94.7 fL (ref 78.0–100.0)
Platelets: 193 10*3/uL (ref 150–400)
RBC: 4.36 MIL/uL (ref 3.87–5.11)
RDW: 14.8 % (ref 11.5–15.5)
WBC: 5.7 10*3/uL (ref 4.0–10.5)

## 2017-03-17 LAB — HEPARIN LEVEL (UNFRACTIONATED): Heparin Unfractionated: 0.68 IU/mL (ref 0.30–0.70)

## 2017-03-17 LAB — PROTIME-INR
INR: 1.17
Prothrombin Time: 14.8 seconds (ref 11.4–15.2)

## 2017-03-17 MED ORDER — FUROSEMIDE 40 MG PO TABS
60.0000 mg | ORAL_TABLET | Freq: Every day | ORAL | Status: DC
  Administered 2017-03-18 – 2017-03-21 (×4): 60 mg via ORAL
  Filled 2017-03-17 (×4): qty 1

## 2017-03-17 MED ORDER — FUROSEMIDE 40 MG PO TABS: 40.0000 mg | ORAL_TABLET | Freq: Two times a day (BID) | ORAL | Status: DC

## 2017-03-17 MED ORDER — WARFARIN SODIUM 7.5 MG PO TABS
7.5000 mg | ORAL_TABLET | Freq: Once | ORAL | Status: AC
  Administered 2017-03-17: 7.5 mg via ORAL
  Filled 2017-03-17: qty 1

## 2017-03-17 NOTE — Progress Notes (Signed)
ANTICOAGULATION CONSULT NOTE - Follow Up Consult  Pharmacy Consult for Heparin and Coumadin Indication: atrial fibrillation and possible LV thrombus  No Known Allergies  Patient Measurements: Height: 5\' 3"  (160 cm) Weight: 133 lb 3.2 oz (60.4 kg) IBW/kg (Calculated) : 52.4 Heparin Dosing Weight: 60.4 kg  Vital Signs: Temp: 98.5 F (36.9 C) (08/31 0614) Temp Source: Oral (08/31 0614) BP: 130/82 (08/31 0614) Pulse Rate: 83 (08/31 0614)  Labs:  Recent Labs  03/15/17 0628 03/16/17 0358 03/16/17 1927 03/17/17 0445  HGB 12.9  --   --  12.8  HCT 41.5  --   --  41.3  PLT 200  --   --  193  LABPROT 14.8 15.0  --  14.8  INR 1.16 1.19  --  1.17  HEPARINUNFRC  --   --  0.69 0.68  CREATININE 1.07* 1.13*  --  1.27*    Estimated Creatinine Clearance: 25.3 mL/min (A) (by C-G formula based on SCr of 1.27 mg/dL (H)).  Assessment:   83 yr old female on Coumadin prior to admission for afib.  Coumadin held for cath, which had to be cancelled on 8/28 due to confusion.   Coumadin resumed on 8/29, but INR still only 1.17 after Coumadin 5 mg daily x 2 days. Heparin bridge added 8/30 for possible LV thrombus per CT of chest. Heparin level remains therapeutic (0.68) on 850 units/hr.    Home Coumadin regimen: 4 mg daily except 2 mg on Wed and Thurs.    INR 1.76 on admit 8/25.  Goal of Therapy:  INR 2-3 Heparin level 0.3-0.7 units/ml Monitor platelets by anticoagulation protocol: Yes   Plan:   Continue Heparin drip at 850 units/hr  Increase Coumadin to 7.5 mg x 1 today.  Daily heparin level, PT/INR and CBC.  Discussed with patient and daughter.  Dennie Fetters, RPh Pager: [phone], or [phone] 03/17/2017,11:21 AM

## 2017-03-17 NOTE — Progress Notes (Signed)
Progress Note  Patient Name: [name] Date of Encounter: 03/17/2017  Primary Cardiologist: Dr. Barrett Shell work up: Dr. Excell Seltzer  Subjective   No complains. Walked to bathroom with OT.   Inpatient Medications    Scheduled Meds: . calcitRIOL  0.25 mcg Oral Daily  . cholecalciferol  1,000 Units Oral Daily  . feeding supplement (GLUCERNA SHAKE)  237 mL Oral BID BM  . furosemide  40 mg Oral BID  . insulin aspart  0-5 Units Subcutaneous QHS  . insulin aspart  0-9 Units Subcutaneous TID WC  . insulin glargine  5 Units Subcutaneous Daily  . lisinopril  5 mg Oral Daily  . metoprolol succinate  75 mg Oral BID  . potassium chloride  40 mEq Oral Daily  . QUEtiapine  25 mg Oral QHS  . rosuvastatin  10 mg Oral q1800  . sodium chloride flush  3 mL Intravenous Q12H  . vitamin B-12  1,000 mcg Oral Daily  . Warfarin - Pharmacist Dosing Inpatient   Does not apply q1800   Continuous Infusions: . sodium chloride    . heparin 800 Units/hr (03/16/17 2100)   PRN Meds: sodium chloride, acetaminophen **OR** acetaminophen, hydrALAZINE, LORazepam, sodium chloride flush   Vital Signs    Vitals:   03/16/17 1047 03/16/17 1415 03/16/17 2025 03/17/17 0614  BP: (!) 123/59 126/75 (!) 132/91 130/82  Pulse: 73 83 90 83  Resp:  18 18 18   Temp:  97.6 F (36.4 C) (!) 97.4 F (36.3 C) 98.5 F (36.9 C)  TempSrc:  Oral Oral Oral  SpO2: 99% 99% 100% 100%  Weight:    133 lb 3.2 oz (60.4 kg)  Height:        Intake/Output Summary (Last 24 hours) at 03/17/17 0952 Last data filed at 03/17/17 1610  Gross per 24 hour  Intake              528 ml  Output             1000 ml  Net             -472 ml   Filed Weights   03/15/17 0655 03/16/17 0418 03/17/17 0614  Weight: 148 lb (67.1 kg) 135 lb 11.2 oz (61.6 kg) 133 lb 3.2 oz (60.4 kg)    Telemetry    Afib rate of 70s, intermittent pacing - Personally Reviewed  ECG    None today   Physical Exam  GEN: Elderly female in no acute distress.     Neck:No JVD Cardiac:IR IR , systolic murmurs, rubs, or gallops.  Respiratory:Clear to auscultation bilaterally. RU:EAVW, nontender, non-distended  MS:No edema; No deformity. Neuro:Nonfocal  Psych: Normal affect   Labs    Chemistry Recent Labs Lab 03/12/17 0258  03/15/17 0628 03/16/17 0358 03/17/17 0445  NA 145  < > 145 140 141  K 3.3*  < > 3.4* 3.8 3.5  CL 111  < > 109 109 109  CO2 27  < > 26 26 26   GLUCOSE 161*  < > 145* 142* 164*  BUN 16  < > 14 20 20   CREATININE 1.01*  < > 1.07* 1.13* 1.27*  CALCIUM 10.0  < > 10.2 10.3 10.3  PROT 6.6  --   --   --   --   ALBUMIN 3.3*  --   --   --   --   AST 19  --   --   --   --   ALT  25  --   --   --   --   ALKPHOS 59  --   --   --   --   BILITOT 1.2  --   --   --   --   GFRNONAA 48*  < > 45* 42* 37*  GFRAA 56*  < > 52* 49* 42*  ANIONGAP 7  < > 10 5 6   < > = values in this interval not displayed.   Hematology Recent Labs Lab 03/13/17 0434 03/15/17 0628 03/17/17 0445  WBC 6.4 5.4 5.7  RBC 4.27 4.35 4.36  HGB 12.9 12.9 12.8  HCT 40.4 41.5 41.3  MCV 94.6 95.4 94.7  MCH 30.2 29.7 29.4  MCHC 31.9 31.1 31.0  RDW 14.7 14.7 14.8  PLT 178 200 193    Cardiac Enzymes Recent Labs Lab 03/11/17 2143 03/12/17 0258 03/12/17 0925  TROPONINI 0.10* 0.12* 0.12*    Recent Labs Lab 03/11/17 1638  TROPIPOC 0.18*     BNP Recent Labs Lab 03/11/17 1616  BNP 1,286.5*     DDimer No results for input(s): DDIMER in the last 168 hours.   Radiology    Ct Coronary Morph W/cta Cor W/score W/ca W/cm &/or Wo/cm  Addendum Date: 03/16/2017   ADDENDUM REPORT: 03/16/2017 08:09 EXAM: OVER-READ INTERPRETATION  CT CHEST The following report is an over-read performed by radiologist Dr. Royal Piedra [facility] on 03/16/2017. This over-read does not include interpretation of cardiac or coronary anatomy or pathology. The cardiac CTA interpretation by the cardiologist is attached. COMPARISON:  None. FINDINGS:  Extracardiac findings will be described separately under dictation for contemporaneously obtained CTA of the chest, abdomen and pelvis. IMPRESSION: Please see separate dictation for contemporaneously obtained CTA of the chest, abdomen and pelvis 03/16/2015 for full description of relevant extracardiac findings. Electronically Signed   By: Trudie Reed M.D.   On: 03/16/2017 08:09   Result Date: 03/16/2017 CLINICAL DATA:  Aortic Stenosis EXAM: Cardiac TAVR CT TECHNIQUE: The patient was scanned on a Siemens Force 192 slice scanner. A 120 kV retrospective scan was triggered in the ascending thoracic aorta at 140 HU's. Gantry rotation speed was 250 msecs and collimation was .6 mm. No beta blockade or nitro were given. The 3D data set was reconstructed in 5% intervals of the R-R cycle. Systolic and diastolic phases were analyzed on a dedicated work station using MPR, MIP and VRT modes. The patient received 80 cc of contrast. FINDINGS: Aortic Valve: Tri leaflet thickened and calcified with fusion of right and non coronary cusps Aorta: Significant calcified aortic atherosclerosis and soft plaque in the ascending thoracic aorta and arch. Some aneurysmal dilatation of the distal arch Sino-tubular Junction:  24 mm Ascending Thoracic Aorta:  34 mm Aortic Arch:  36 mm distal aneurysmal segment Descending Thoracic Aorta:  24 mm Sinus of Valsalva Measurements: Non-coronary:  27.9 mm Right - coronary:  27.5 mm Left -   coronary:  27.9 mm Coronary Artery Height above Annulus: Left Main:  Some what shallow 10.3 mm Right Coronary:  Some what shallow 10.8 mm Virtual Basal Annulus Measurements: Maximum / Minimum Diameter:  25.8 mm x 20.1 mm Perimeter:  73.2 mm Area:  405 mm 2 Coronary Arteries: Sufficient height above annulus for Sapien 3 delivery Optimum Fluoroscopic Angle for Delivery: LAO 15 degrees Caudal 13 degrees IMPRESSION: 1) Calcified tri leaflet aortic valve with fusion of right and non coronary cusp and annulus with  perimeter 73.2 mm area 405 mm2 2) LAA thrombus  appears present no delayed images performed consider TEE confirmation 3) Optimum angiographic angle for delivery LAO 15 degrees Caudal 13 degrees 4) Coronary height sufficient for Sapien 3 delivery Area measurement suitable for a 23 mm Sapien 3 valve Perimeter measurement along with shallow coronary and sinus less than 29 mm suggests suitable for a 26 mm Ev[employer]ut [employer] Electronically Signed: By: Charlton Haws M.D. On: 03/15/2017 18:18   Ct Angio Chest Aorta W/cm &/or Wo/cm  Result Date: 03/16/2017 CLINICAL DATA:  83 year old female with history of severe aortic stenosis. Preprocedural study prior to potential transcatheter aortic valve replacement (TAVR) procedure. EXAM: CT ANGIOGRAPHY CHEST, ABDOMEN AND PELVIS TECHNIQUE: Multidetector CT imaging through the chest, abdomen and pelvis was performed using the standard protocol during bolus administration of intravenous contrast. Multiplanar reconstructed images and MIPs were obtained and reviewed to evaluate the vascular anatomy. CONTRAST:  120 mL of Isovue 370. COMPARISON:  Chest CT 07/25/2005. FINDINGS: CTA CHEST FINDINGS Cardiovascular: Heart size is enlarged with biatrial dilatation. There is no significant pericardial fluid, thickening or pericardial calcification. There is aortic atherosclerosis, as well as atherosclerosis of the great vessels of the mediastinum and the coronary arteries, including calcified atherosclerotic plaque in the left main, left anterior descending, left circumflex and right coronary arteries. Left-sided biventricular pacemaker with lead tips terminating in the right ventricular apex and overlying the lateral wall the left ventricle via the coronary sinus and coronary veins. Some mild thickening and calcification of the aortic valve. Tiny filling defect in the tip of the left atrial appendage (axial image 38 of series 14), suspicious for small volume of left atrial appendage  thrombus. Mediastinum/Lymph Nodes: No pathologically enlarged mediastinal or hilar lymph nodes. Esophagus is unremarkable in appearance. No axillary lymphadenopathy. Lungs/Pleura: Small cluster of peribronchovascular micronodularity in the posterior aspect of the right upper lobe near the apex, presumably areas of mucoid impaction within terminal bronchioles. 4 mm pulmonary nodule in the right middle lobe (axial image 40 of series 15). Trace right pleural effusion partially loculated in the right major fissure. No left pleural effusion. No definite acute consolidative airspace disease. Musculoskeletal/Soft Tissues: There are no aggressive appearing lytic or blastic lesions noted in the visualized portions of the skeleton. CTA ABDOMEN AND PELVIS FINDINGS Hepatobiliary: No cystic or solid hepatic lesions. No intra or extrahepatic biliary ductal dilatation. Gallbladder is normal in appearance. Pancreas: No pancreatic mass. No pancreatic ductal dilatation. No pancreatic or peripancreatic fluid or inflammatory changes. Spleen: Unremarkable. Adrenals/Urinary Tract: Multifocal cortical thinning in the kidneys bilaterally. Subcentimeter low-attenuation lesion in the lateral aspect of the interpolar region of the right kidney is too small to definitively characterize, but likely to represent a cyst. No hydroureteronephrosis. Urinary bladder is normal in appearance. Bilateral adrenal glands are normal in appearance. Stomach/Bowel: Normal appearance of the stomach. No pathologic dilatation of small bowel or colon. Normal appendix. Vascular/Lymphatic: Aortic atherosclerosis, without evidence of aneurysm or dissection in the abdominal or pelvic vasculature. Vascular findings and measurements pertinent to potential TAVR procedure, as detailed below. Celiac axis, superior mesenteric artery and inferior mesenteric artery all appear widely patent without definite hemodynamically significant stenosis. Single renal arteries  bilaterally also appear widely patent without hemodynamically significant stenosis. No lymphadenopathy identified in the abdomen or pelvis. Reproductive: Status post hysterectomy. Ovaries are not confidently identified may be surgically absent or atrophic. Other: No significant volume of ascites.  No pneumoperitoneum. Musculoskeletal: There are no aggressive appearing lytic or blastic lesions noted in the visualized portions of the skeleton. VASCULAR MEASUREMENTS  PERTINENT TO TAVR: AORTA: Minimal Aortic Diameter -  12 x 13 mm Severity of Aortic Calcification -  severe RIGHT PELVIS: Right Common Iliac Artery - Minimal Diameter - 7.9 x 8.7 mm Tortuosity - mild Calcification - moderate Right External Iliac Artery - Minimal Diameter - 6.4 x 6.4 mm Tortuosity - mild Calcification - minimal Right Common Femoral Artery - Minimal Diameter - 5.8 x 6.0 mm Tortuosity - mild Calcification - mild LEFT PELVIS: Left Common Iliac Artery - Minimal Diameter - 8.5 x 8.3 mm Tortuosity - mild Calcification - moderate Left External Iliac Artery - Minimal Diameter - 6.7 x 6.3 mm Tortuosity - mild Calcification - minimal Left Common Femoral Artery - Minimal Diameter - 5.9 x 5.9 mm Tortuosity - mild Calcification - mild Review of the MIP images confirms the above findings. IMPRESSION: 1. Vascular findings and measurements pertinent to potential TAVR procedure, as detailed above. This patient does appear to have suitable pelvic arterial access bilaterally. 2. Mild thickening and calcification of the aortic valve. 3. Cardiomegaly with biatrial dilatation. Notably, there appears to be a small filling defect in the tip of the left atrial appendage. Further evaluation with transesophageal echocardiography is suggested in the near future (if clinically appropriate) to evaluate for potential left atrial appendage thrombus as this places the patient at risk for systemic embolization. 4. Small right partially loculated pleural effusion, with some  fluid in the right major fissure. 5. Multiple small pulmonary nodules in the right lung measuring 4 mm or less in size, nonspecific, but strongly favored to be benign. No follow-up needed if patient is low-risk (and has no known or suspected primary neoplasm). Non-contrast chest CT can be considered in 12 months if patient is high-risk. This recommendation follows the consensus statement: Guidelines for Management of Incidental Pulmonary Nodules Detected on CT Images: From the Fleischner Society 2017; Radiology 2017; 284:228-243. 6. Additional incidental findings, as above. Aortic Atherosclerosis (ICD10-I70.0). Electronically Signed   By: Trudie Reed M.D.   On: 03/16/2017 08:56   Ct Angio Abd/pel W/ And/or W/o  Result Date: 03/16/2017 CLINICAL DATA:  83 year old female with history of severe aortic stenosis. Preprocedural study prior to potential transcatheter aortic valve replacement (TAVR) procedure. EXAM: CT ANGIOGRAPHY CHEST, ABDOMEN AND PELVIS TECHNIQUE: Multidetector CT imaging through the chest, abdomen and pelvis was performed using the standard protocol during bolus administration of intravenous contrast. Multiplanar reconstructed images and MIPs were obtained and reviewed to evaluate the vascular anatomy. CONTRAST:  120 mL of Isovue 370. COMPARISON:  Chest CT 07/25/2005. FINDINGS: CTA CHEST FINDINGS Cardiovascular: Heart size is enlarged with biatrial dilatation. There is no significant pericardial fluid, thickening or pericardial calcification. There is aortic atherosclerosis, as well as atherosclerosis of the great vessels of the mediastinum and the coronary arteries, including calcified atherosclerotic plaque in the left main, left anterior descending, left circumflex and right coronary arteries. Left-sided biventricular pacemaker with lead tips terminating in the right ventricular apex and overlying the lateral wall the left ventricle via the coronary sinus and coronary veins. Some mild  thickening and calcification of the aortic valve. Tiny filling defect in the tip of the left atrial appendage (axial image 38 of series 14), suspicious for small volume of left atrial appendage thrombus. Mediastinum/Lymph Nodes: No pathologically enlarged mediastinal or hilar lymph nodes. Esophagus is unremarkable in appearance. No axillary lymphadenopathy. Lungs/Pleura: Small cluster of peribronchovascular micronodularity in the posterior aspect of the right upper lobe near the apex, presumably areas of mucoid impaction within terminal bronchioles. 4 mm  pulmonary nodule in the right middle lobe (axial image 40 of series 15). Trace right pleural effusion partially loculated in the right major fissure. No left pleural effusion. No definite acute consolidative airspace disease. Musculoskeletal/Soft Tissues: There are no aggressive appearing lytic or blastic lesions noted in the visualized portions of the skeleton. CTA ABDOMEN AND PELVIS FINDINGS Hepatobiliary: No cystic or solid hepatic lesions. No intra or extrahepatic biliary ductal dilatation. Gallbladder is normal in appearance. Pancreas: No pancreatic mass. No pancreatic ductal dilatation. No pancreatic or peripancreatic fluid or inflammatory changes. Spleen: Unremarkable. Adrenals/Urinary Tract: Multifocal cortical thinning in the kidneys bilaterally. Subcentimeter low-attenuation lesion in the lateral aspect of the interpolar region of the right kidney is too small to definitively characterize, but likely to represent a cyst. No hydroureteronephrosis. Urinary bladder is normal in appearance. Bilateral adrenal glands are normal in appearance. Stomach/Bowel: Normal appearance of the stomach. No pathologic dilatation of small bowel or colon. Normal appendix. Vascular/Lymphatic: Aortic atherosclerosis, without evidence of aneurysm or dissection in the abdominal or pelvic vasculature. Vascular findings and measurements pertinent to potential TAVR procedure, as  detailed below. Celiac axis, superior mesenteric artery and inferior mesenteric artery all appear widely patent without definite hemodynamically significant stenosis. Single renal arteries bilaterally also appear widely patent without hemodynamically significant stenosis. No lymphadenopathy identified in the abdomen or pelvis. Reproductive: Status post hysterectomy. Ovaries are not confidently identified may be surgically absent or atrophic. Other: No significant volume of ascites.  No pneumoperitoneum. Musculoskeletal: There are no aggressive appearing lytic or blastic lesions noted in the visualized portions of the skeleton. VASCULAR MEASUREMENTS PERTINENT TO TAVR: AORTA: Minimal Aortic Diameter -  12 x 13 mm Severity of Aortic Calcification -  severe RIGHT PELVIS: Right Common Iliac Artery - Minimal Diameter - 7.9 x 8.7 mm Tortuosity - mild Calcification - moderate Right External Iliac Artery - Minimal Diameter - 6.4 x 6.4 mm Tortuosity - mild Calcification - minimal Right Common Femoral Artery - Minimal Diameter - 5.8 x 6.0 mm Tortuosity - mild Calcification - mild LEFT PELVIS: Left Common Iliac Artery - Minimal Diameter - 8.5 x 8.3 mm Tortuosity - mild Calcification - moderate Left External Iliac Artery - Minimal Diameter - 6.7 x 6.3 mm Tortuosity - mild Calcification - minimal Left Common Femoral Artery - Minimal Diameter - 5.9 x 5.9 mm Tortuosity - mild Calcification - mild Review of the MIP images confirms the above findings. IMPRESSION: 1. Vascular findings and measurements pertinent to potential TAVR procedure, as detailed above. This patient does appear to have suitable pelvic arterial access bilaterally. 2. Mild thickening and calcification of the aortic valve. 3. Cardiomegaly with biatrial dilatation. Notably, there appears to be a small filling defect in the tip of the left atrial appendage. Further evaluation with transesophageal echocardiography is suggested in the near future (if clinically  appropriate) to evaluate for potential left atrial appendage thrombus as this places the patient at risk for systemic embolization. 4. Small right partially loculated pleural effusion, with some fluid in the right major fissure. 5. Multiple small pulmonary nodules in the right lung measuring 4 mm or less in size, nonspecific, but strongly favored to be benign. No follow-up needed if patient is low-risk (and has no known or suspected primary neoplasm). Non-contrast chest CT can be considered in 12 months if patient is high-risk. This recommendation follows the consensus statement: Guidelines for Management of Incidental Pulmonary Nodules Detected on CT Images: From the Fleischner Society 2017; Radiology 2017; 284:228-243. 6. Additional incidental findings, as above. Aortic  Atherosclerosis (ICD10-I70.0). Electronically Signed   By: Trudie Reed M.D.   On: 03/16/2017 08:56    Cardiac Studies   Echo 03/13/17 Left ventricle: The cavity size was normal. There was severe concentric hypertrophy. Systolic function was moderately reduced. The estimated ejection fraction was in the range of 35% to 40%. Wall motion was normal; there were no regional wall motion abnormalities. The study was not technically sufficient to allow evaluation of LV diastolic dysfunction due to atrial fibrillation. - Aortic valve: Valve mobility was restricted. Mean gradient (S): 43 mm Hg. Peak gradient (S): 75 mm Hg. Valve area (VTI): 0.45 cm^2. Valve area (Vmax): 0.46 cm^2. Valve area (Vmean): 0.42 cm^2. - Mitral valve: Calcified annulus. Mildly thickened leaflets . There was mild regurgitation. - Left atrium: The atrium was moderately dilated. - Right ventricle: The cavity size was mildly dilated. Wall thickness was normal. Systolic function was mildly reduced. - Right atrium: The atrium was moderately dilated. - Tricuspid valve: There was moderate regurgitation. - Pulmonary arteries: Systolic  pressure was moderately increased. PA peak pressure: 47 mm Hg (S).  Impressions:  - When compared to the prior study from 09/29/2014 LVEF has decreased from 45-50% to 35-40% and aortic stenosis is now severe. Moderate pulmonary hypertension.  Patient Profile     83 y.o.femalewith hx of diabetes mellitus, hyperlipidemia, permanent atrial fibrillation, pacemaker for tachycardia-bradycardia syndrome, chronic kidney disease stage III here with acute on chronic heart failure due to moderate left ventricular dysfunction and critical aortic stenosis.   Assessment & Plan    1. Acute on chronic systolic CHF due to critical AS and pHTN - net I &O negative 1L.. Total weight down 17lb (150-->133lb). Volume status stable.  -  Continue current dose of lasix at 40mg  BID (home dose). Watch for over diuresis. Given gradually elevated Scr, hold PM dose of lasix. Reassess tomorrow.  2. Severe AS - L and R cath cancelled due to confusion. Undergoing TVAR work up--> outpatient follow up in valve clinic.   3. permanent atrial fibrillation - rate controlled. Coumadin resumed-->per pharmacy.   6. Possible LV thrombus on CT of chest - Start Heparin bridge in addition of coumadin.   7. AKI - AS above Lab Results  Component Value Date   CREATININE 1.27 (H) 03/17/2017   CREATININE 1.13 (H) 03/16/2017   CREATININE 1.07 (H) 03/15/2017    Dispo: likely she will go home over weekend. She will follow up PCP early next week for INR check. F/u with Dr. Excell Seltzer 9/17 for TVAR work up. Discussed with daughter at bedside in details.   *Weigh yourself on the same scale at same time of day and keep a log. *Report weight gain of > 3 lbs in 1 day or 5 lbs over the course of a week and/or symptoms of excess fluid (shortness of breath, difficulty lying flat, swelling, poor appetite, abdominal fullness/bloating, etc) to your doctor immediately. *Avoid foods that are high in sodium (processed,  pre-packaged/canned goods, fast foods, etc). *Please attend all scheduled and reccommended follow up appointments    Signed, Bhagat,Bhavinkumar, PA  03/17/2017, 9:52 AM    I have seen and examined the patient along with Bhagat,Bhavinkumar, PA.  I have reviewed the chart, notes and new data.  I agree with PA's note.  Key new complaints: No dyspnea at rest, denies dizziness/syncope Key examination changes: no edema, normal JVP, late peaking musical systolic ejection murmur, weak and delayed carotid pulses. Key new findings / data: creatinine inching up.   PLAN:  Reduce furosemide to 60 mg once daily. (home dose before admission was 40 mg daily, currently on 40 mg BID). Check BMET again to make sure this is not contrast nephrotoxicity. DC when INR >2.0.  Thurmon Fair, MD, [facility] [phone] 03/17/2017, 11:40 AM

## 2017-03-17 NOTE — Consult Note (Signed)
   [facility] CM Inpatient Consult   03/17/2017  [name] Jan 17, 1927 [id number]  Patient is an 83 year old with Diabetes 2, HF, Atrial Fib.  Patient  Was referred for [facility] First program with [facility] for post hospital needs. Edwina from [place] aware, as well as inpatient RNCM, [place]  Met with patient and daughter, [name] at the bedside, regarding the program and written consent received, [facility] folder with information given as well.  Daughter has concerns for cost of bills as verbalized at the bedside.  Explained that [place] [facility] is of no cost covered benefit of her Medicare.  For questions, please contact:  Natividad Brood, RN BSN [facility] Liaison  [phone] business mobile phone Toll free office [phone]

## 2017-03-17 NOTE — Progress Notes (Signed)
PT Cancellation Note  Patient Details Name: [name] MRN: [id number] DOB: 1927/02/12   Cancelled Treatment:    Reason Eval/Treat Not Completed: Patient declined, no reason specified Patient declined mobility this am due to just working with OT. PT will check on pt later as time allows.    Derek Mound, PTA Pager: [phone]   03/17/2017, 11:06 AM

## 2017-03-17 NOTE — Progress Notes (Signed)
PROGRESS NOTE    [name]  ZOX:[id number] DOB: 05-09-1927 DOA: 03/11/2017 PCP: Lorenda Ishihara, MD   Brief Narrative: 83 year old female with type 2 diabetes, hypertension, congestive heart failure,  atrial fibrillation on anticoagulation, aortic stenosis, presented with dyspnea for 1 week. In the ED patient was found to have elevated BNP consistent with congestive heart failure. Patient was admitted for further evaluation.  Assessment & Plan:   #Acute on chronic systolic congestive heart failure: -Shortness of breath improved. Cardiology consult note from today reviewed, the dose of Lasix changed to 60 mg daily starting from tomorrow. Mild elevation in serum creatinine level today. - Monitor urine output and BMP. Cardiology consult appreciated.  #Severe aortic stenosis/ LAA thrombus: Cardiac cath was canceled  due to pt's confusion. Mental status improved and around baseline, as per patient's daughter. -CT coronary angiogram consistent with possible left atrial thrombus. Started on IV heparin for bridging and continuing Coumadin by cardiologist. Continue to monitor INR. Discharge patient home when INR is more than 2.  #Hypokalemia: Improved. Monitor BMP.  #Nonsustained V. tach: asymptomatic. Maintain electrolytes.  #Permanent atrial fibrillation: Monitor heart rate. Currently on IV heparin and Coumadin. Monitor INR.  #Hypertension: Monitor blood pressure. Continue lisinopril, metoprolol and current medication.   #Diabetes with hyperglycemia: Continue current insulin regimen. Monitor blood sugar level.  #Acute metabolic encephalopathy. Currently mental status at baseline. Nonfocal neurologic exam. CTH with chronic changes. PT OT evaluation and treatment.  DVT prophylaxis: Coumadin and IV heparin. Code Status: Full code Family Communication: Patient's daughter at bedside Disposition Plan: Likely discharge home in 1-2 days pending cardiology workup    Consultants:    Cardiology  Procedures: None Antimicrobials: None  Subjective: Seen and examined at bedside. No new event. Denied chest pain, shortness of breath, nausea vomiting. Patient's daughter at bedside.. Objective: Vitals:   03/16/17 1047 03/16/17 1415 03/16/17 2025 03/17/17 0614  BP: (!) 123/59 126/75 (!) 132/91 130/82  Pulse: 73 83 90 83  Resp:  18 18 18   Temp:  97.6 F (36.4 C) (!) 97.4 F (36.3 C) 98.5 F (36.9 C)  TempSrc:  Oral Oral Oral  SpO2: 99% 99% 100% 100%  Weight:    60.4 kg (133 lb 3.2 oz)  Height:        Intake/Output Summary (Last 24 hours) at 03/17/17 1219 Last data filed at 03/17/17 8119  Gross per 24 hour  Intake              528 ml  Output              800 ml  Net             -272 ml   Filed Weights   03/15/17 0655 03/16/17 0418 03/17/17 0614  Weight: 67.1 kg (148 lb) 61.6 kg (135 lb 11.2 oz) 60.4 kg (133 lb 3.2 oz)    Examination:  General exam: Elderly female lying in bed comfortable, not in distress  Respiratory system: Clear bilaterally, no wheezing or crackle Cardiovascular system: Regular rate rhythm, systolic murmur Gastrointestinal system: Abdomen soft, nontender, nondistended. Bowel sound positive Central nervous system: Following commands Extremities: Symmetric 5 x 5 power. Skin: No rashes, lesions or ulcers     Data Reviewed: I have personally reviewed following labs and imaging studies  CBC:  Recent Labs Lab 03/11/17 1554 03/12/17 0258 03/12/17 1412 03/13/17 0434 03/15/17 0628 03/17/17 0445  WBC 7.3 6.1  --  6.4 5.4 5.7  HGB 13.0 12.5  --  12.9 12.9 12.8  HCT 40.1 39.2 39.9 40.4 41.5 41.3  MCV 92.2 93.6  --  94.6 95.4 94.7  PLT 167 169  --  178 200 193   Basic Metabolic Panel:  Recent Labs Lab 03/12/17 1412 03/13/17 0434 03/14/17 0546 03/15/17 0628 03/16/17 0358 03/17/17 0445  NA  --  146* 142 145 140 141  K  --  3.7 3.6 3.4* 3.8 3.5  CL  --  110 108 109 109 109  CO2  --  25 24 26 26 26   GLUCOSE  --  164* 214*  145* 142* 164*  BUN  --  15 21* 14 20 20   CREATININE  --  1.06* 1.20* 1.07* 1.13* 1.27*  CALCIUM  --  10.1 10.0 10.2 10.3 10.3  MG 2.2  --  2.2 2.2  --   --    GFR: Estimated Creatinine Clearance: 25.3 mL/min (A) (by C-G formula based on SCr of 1.27 mg/dL (H)). Liver Function Tests:  Recent Labs Lab 03/12/17 0258  AST 19  ALT 25  ALKPHOS 59  BILITOT 1.2  PROT 6.6  ALBUMIN 3.3*   No results for input(s): LIPASE, AMYLASE in the last 168 hours.  Recent Labs Lab 03/12/17 1412  AMMONIA 29   Coagulation Profile:  Recent Labs Lab 03/13/17 0434 03/14/17 0546 03/15/17 0628 03/16/17 0358 03/17/17 0445  INR 1.63 1.35 1.16 1.19 1.17   Cardiac Enzymes:  Recent Labs Lab 03/11/17 2143 03/12/17 0258 03/12/17 0925  TROPONINI 0.10* 0.12* 0.12*   BNP (last 3 results) No results for input(s): PROBNP in the last 8760 hours. HbA1C: No results for input(s): HGBA1C in the last 72 hours. CBG:  Recent Labs Lab 03/16/17 1124 03/16/17 1643 03/16/17 2120 03/17/17 0738 03/17/17 1112  GLUCAP 165* 224* 262* 144* 237*   Lipid Profile: No results for input(s): CHOL, HDL, LDLCALC, TRIG, CHOLHDL, LDLDIRECT in the last 72 hours. Thyroid Function Tests: No results for input(s): TSH, T4TOTAL, FREET4, T3FREE, THYROIDAB in the last 72 hours. Anemia Panel: No results for input(s): VITAMINB12, FOLATE, FERRITIN, TIBC, IRON, RETICCTPCT in the last 72 hours. Sepsis Labs: No results for input(s): PROCALCITON, LATICACIDVEN in the last 168 hours.  Recent Results (from the past 240 hour(s))  Urine Culture     Status: Abnormal   Collection Time: 03/13/17  5:10 AM  Result Value Ref Range Status   Specimen Description URINE, RANDOM  Final   Special Requests NONE  Final   Culture MULTIPLE SPECIES PRESENT, SUGGEST RECOLLECTION (A)  Final   Report Status 03/14/2017 FINAL  Final         Radiology Studies: Ct Coronary Morph W/cta Cor W/score W/ca W/cm &/or Wo/cm  Addendum Date:  03/16/2017   ADDENDUM REPORT: 03/16/2017 08:09 EXAM: OVER-READ INTERPRETATION  CT CHEST The following report is an over-read performed by radiologist Dr. Royal Piedra [facility] on 03/16/2017. This over-read does not include interpretation of cardiac or coronary anatomy or pathology. The cardiac CTA interpretation by the cardiologist is attached. COMPARISON:  None. FINDINGS: Extracardiac findings will be described separately under dictation for contemporaneously obtained CTA of the chest, abdomen and pelvis. IMPRESSION: Please see separate dictation for contemporaneously obtained CTA of the chest, abdomen and pelvis 03/16/2015 for full description of relevant extracardiac findings. Electronically Signed   By: Trudie Reed M.D.   On: 03/16/2017 08:09   Result Date: 03/16/2017 CLINICAL DATA:  Aortic Stenosis EXAM: Cardiac TAVR CT TECHNIQUE: The patient was scanned on a Siemens Force 192 slice scanner. A 120 kV retrospective  scan was triggered in the ascending thoracic aorta at 140 HU's. Gantry rotation speed was 250 msecs and collimation was .6 mm. No beta blockade or nitro were given. The 3D data set was reconstructed in 5% intervals of the R-R cycle. Systolic and diastolic phases were analyzed on a dedicated work station using MPR, MIP and VRT modes. The patient received 80 cc of contrast. FINDINGS: Aortic Valve: Tri leaflet thickened and calcified with fusion of right and non coronary cusps Aorta: Significant calcified aortic atherosclerosis and soft plaque in the ascending thoracic aorta and arch. Some aneurysmal dilatation of the distal arch Sino-tubular Junction:  24 mm Ascending Thoracic Aorta:  34 mm Aortic Arch:  36 mm distal aneurysmal segment Descending Thoracic Aorta:  24 mm Sinus of Valsalva Measurements: Non-coronary:  27.9 mm Right - coronary:  27.5 mm Left -   coronary:  27.9 mm Coronary Artery Height above Annulus: Left Main:  Some what shallow 10.3 mm Right Coronary:  Some  what shallow 10.8 mm Virtual Basal Annulus Measurements: Maximum / Minimum Diameter:  25.8 mm x 20.1 mm Perimeter:  73.2 mm Area:  405 mm 2 Coronary Arteries: Sufficient height above annulus for Sapien 3 delivery Optimum Fluoroscopic Angle for Delivery: LAO 15 degrees Caudal 13 degrees IMPRESSION: 1) Calcified tri leaflet aortic valve with fusion of right and non coronary cusp and annulus with perimeter 73.2 mm area 405 mm2 2) LAA thrombus appears present no delayed images performed consider TEE confirmation 3) Optimum angiographic angle for delivery LAO 15 degrees Caudal 13 degrees 4) Coronary height sufficient for Sapien 3 delivery Area measurement suitable for a 23 mm Sapien 3 valve Perimeter measurement along with shallow coronary and sinus less than 29 mm suggests suitable for a 26 mm Evolut [employer] Electronically Signed: By: Charlton Haws M.D. On: 03/15/2017 18:18   Ct Angio Chest Aorta W/cm &/or Wo/cm  Result Date: 03/16/2017 CLINICAL DATA:  83 year old female with history of severe aortic stenosis. Preprocedural study prior to potential transcatheter aortic valve replacement (TAVR) procedure. EXAM: CT ANGIOGRAPHY CHEST, ABDOMEN AND PELVIS TECHNIQUE: Multidetector CT imaging through the chest, abdomen and pelvis was performed using the standard protocol during bolus administration of intravenous contrast. Multiplanar reconstructed images and MIPs were obtained and reviewed to evaluate the vascular anatomy. CONTRAST:  120 mL of Isovue 370. COMPARISON:  Chest CT 07/25/2005. FINDINGS: CTA CHEST FINDINGS Cardiovascular: Heart size is enlarged with biatrial dilatation. There is no significant pericardial fluid, thickening or pericardial calcification. There is aortic atherosclerosis, as well as atherosclerosis of the great vessels of the mediastinum and the coronary arteries, including calcified atherosclerotic plaque in the left main, left anterior descending, left circumflex and right coronary  arteries. Left-sided biventricular pacemaker with lead tips terminating in the right ventricular apex and overlying the lateral wall the left ventricle via the coronary sinus and coronary veins. Some mild thickening and calcification of the aortic valve. Tiny filling defect in the tip of the left atrial appendage (axial image 38 of series 14), suspicious for small volume of left atrial appendage thrombus. Mediastinum/Lymph Nodes: No pathologically enlarged mediastinal or hilar lymph nodes. Esophagus is unremarkable in appearance. No axillary lymphadenopathy. Lungs/Pleura: Small cluster of peribronchovascular micronodularity in the posterior aspect of the right upper lobe near the apex, presumably areas of mucoid impaction within terminal bronchioles. 4 mm pulmonary nodule in the right middle lobe (axial image 40 of series 15). Trace right pleural effusion partially loculated in the right major fissure. No left pleural effusion.  No definite acute consolidative airspace disease. Musculoskeletal/Soft Tissues: There are no aggressive appearing lytic or blastic lesions noted in the visualized portions of the skeleton. CTA ABDOMEN AND PELVIS FINDINGS Hepatobiliary: No cystic or solid hepatic lesions. No intra or extrahepatic biliary ductal dilatation. Gallbladder is normal in appearance. Pancreas: No pancreatic mass. No pancreatic ductal dilatation. No pancreatic or peripancreatic fluid or inflammatory changes. Spleen: Unremarkable. Adrenals/Urinary Tract: Multifocal cortical thinning in the kidneys bilaterally. Subcentimeter low-attenuation lesion in the lateral aspect of the interpolar region of the right kidney is too small to definitively characterize, but likely to represent a cyst. No hydroureteronephrosis. Urinary bladder is normal in appearance. Bilateral adrenal glands are normal in appearance. Stomach/Bowel: Normal appearance of the stomach. No pathologic dilatation of small bowel or colon. Normal appendix.  Vascular/Lymphatic: Aortic atherosclerosis, without evidence of aneurysm or dissection in the abdominal or pelvic vasculature. Vascular findings and measurements pertinent to potential TAVR procedure, as detailed below. Celiac axis, superior mesenteric artery and inferior mesenteric artery all appear widely patent without definite hemodynamically significant stenosis. Single renal arteries bilaterally also appear widely patent without hemodynamically significant stenosis. No lymphadenopathy identified in the abdomen or pelvis. Reproductive: Status post hysterectomy. Ovaries are not confidently identified may be surgically absent or atrophic. Other: No significant volume of ascites.  No pneumoperitoneum. Musculoskeletal: There are no aggressive appearing lytic or blastic lesions noted in the visualized portions of the skeleton. VASCULAR MEASUREMENTS PERTINENT TO TAVR: AORTA: Minimal Aortic Diameter -  12 x 13 mm Severity of Aortic Calcification -  severe RIGHT PELVIS: Right Common Iliac Artery - Minimal Diameter - 7.9 x 8.7 mm Tortuosity - mild Calcification - moderate Right External Iliac Artery - Minimal Diameter - 6.4 x 6.4 mm Tortuosity - mild Calcification - minimal Right Common Femoral Artery - Minimal Diameter - 5.8 x 6.0 mm Tortuosity - mild Calcification - mild LEFT PELVIS: Left Common Iliac Artery - Minimal Diameter - 8.5 x 8.3 mm Tortuosity - mild Calcification - moderate Left External Iliac Artery - Minimal Diameter - 6.7 x 6.3 mm Tortuosity - mild Calcification - minimal Left Common Femoral Artery - Minimal Diameter - 5.9 x 5.9 mm Tortuosity - mild Calcification - mild Review of the MIP images confirms the above findings. IMPRESSION: 1. Vascular findings and measurements pertinent to potential TAVR procedure, as detailed above. This patient does appear to have suitable pelvic arterial access bilaterally. 2. Mild thickening and calcification of the aortic valve. 3. Cardiomegaly with biatrial dilatation.  Notably, there appears to be a small filling defect in the tip of the left atrial appendage. Further evaluation with transesophageal echocardiography is suggested in the near future (if clinically appropriate) to evaluate for potential left atrial appendage thrombus as this places the patient at risk for systemic embolization. 4. Small right partially loculated pleural effusion, with some fluid in the right major fissure. 5. Multiple small pulmonary nodules in the right lung measuring 4 mm or less in size, nonspecific, but strongly favored to be benign. No follow-up needed if patient is low-risk (and has no known or suspected primary neoplasm). Non-contrast chest CT can be considered in 12 months if patient is high-risk. This recommendation follows the consensus statement: Guidelines for Management of Incidental Pulmonary Nodules Detected on CT Images: From the Fleischner Society 2017; Radiology 2017; 284:228-243. 6. Additional incidental findings, as above. Aortic Atherosclerosis (ICD10-I70.0). Electronically Signed   By: Trudie Reed M.D.   On: 03/16/2017 08:56   Ct Angio Abd/pel W/ And/or W/o  Result Date: 03/16/2017  CLINICAL DATA:  83 year old female with history of severe aortic stenosis. Preprocedural study prior to potential transcatheter aortic valve replacement (TAVR) procedure. EXAM: CT ANGIOGRAPHY CHEST, ABDOMEN AND PELVIS TECHNIQUE: Multidetector CT imaging through the chest, abdomen and pelvis was performed using the standard protocol during bolus administration of intravenous contrast. Multiplanar reconstructed images and MIPs were obtained and reviewed to evaluate the vascular anatomy. CONTRAST:  120 mL of Isovue 370. COMPARISON:  Chest CT 07/25/2005. FINDINGS: CTA CHEST FINDINGS Cardiovascular: Heart size is enlarged with biatrial dilatation. There is no significant pericardial fluid, thickening or pericardial calcification. There is aortic atherosclerosis, as well as atherosclerosis of the  great vessels of the mediastinum and the coronary arteries, including calcified atherosclerotic plaque in the left main, left anterior descending, left circumflex and right coronary arteries. Left-sided biventricular pacemaker with lead tips terminating in the right ventricular apex and overlying the lateral wall the left ventricle via the coronary sinus and coronary veins. Some mild thickening and calcification of the aortic valve. Tiny filling defect in the tip of the left atrial appendage (axial image 38 of series 14), suspicious for small volume of left atrial appendage thrombus. Mediastinum/Lymph Nodes: No pathologically enlarged mediastinal or hilar lymph nodes. Esophagus is unremarkable in appearance. No axillary lymphadenopathy. Lungs/Pleura: Small cluster of peribronchovascular micronodularity in the posterior aspect of the right upper lobe near the apex, presumably areas of mucoid impaction within terminal bronchioles. 4 mm pulmonary nodule in the right middle lobe (axial image 40 of series 15). Trace right pleural effusion partially loculated in the right major fissure. No left pleural effusion. No definite acute consolidative airspace disease. Musculoskeletal/Soft Tissues: There are no aggressive appearing lytic or blastic lesions noted in the visualized portions of the skeleton. CTA ABDOMEN AND PELVIS FINDINGS Hepatobiliary: No cystic or solid hepatic lesions. No intra or extrahepatic biliary ductal dilatation. Gallbladder is normal in appearance. Pancreas: No pancreatic mass. No pancreatic ductal dilatation. No pancreatic or peripancreatic fluid or inflammatory changes. Spleen: Unremarkable. Adrenals/Urinary Tract: Multifocal cortical thinning in the kidneys bilaterally. Subcentimeter low-attenuation lesion in the lateral aspect of the interpolar region of the right kidney is too small to definitively characterize, but likely to represent a cyst. No hydroureteronephrosis. Urinary bladder is normal in  appearance. Bilateral adrenal glands are normal in appearance. Stomach/Bowel: Normal appearance of the stomach. No pathologic dilatation of small bowel or colon. Normal appendix. Vascular/Lymphatic: Aortic atherosclerosis, without evidence of aneurysm or dissection in the abdominal or pelvic vasculature. Vascular findings and measurements pertinent to potential TAVR procedure, as detailed below. Celiac axis, superior mesenteric artery and inferior mesenteric artery all appear widely patent without definite hemodynamically significant stenosis. Single renal arteries bilaterally also appear widely patent without hemodynamically significant stenosis. No lymphadenopathy identified in the abdomen or pelvis. Reproductive: Status post hysterectomy. Ovaries are not confidently identified may be surgically absent or atrophic. Other: No significant volume of ascites.  No pneumoperitoneum. Musculoskeletal: There are no aggressive appearing lytic or blastic lesions noted in the visualized portions of the skeleton. VASCULAR MEASUREMENTS PERTINENT TO TAVR: AORTA: Minimal Aortic Diameter -  12 x 13 mm Severity of Aortic Calcification -  severe RIGHT PELVIS: Right Common Iliac Artery - Minimal Diameter - 7.9 x 8.7 mm Tortuosity - mild Calcification - moderate Right External Iliac Artery - Minimal Diameter - 6.4 x 6.4 mm Tortuosity - mild Calcification - minimal Right Common Femoral Artery - Minimal Diameter - 5.8 x 6.0 mm Tortuosity - mild Calcification - mild LEFT PELVIS: Left Common Iliac Artery - Minimal  Diameter - 8.5 x 8.3 mm Tortuosity - mild Calcification - moderate Left External Iliac Artery - Minimal Diameter - 6.7 x 6.3 mm Tortuosity - mild Calcification - minimal Left Common Femoral Artery - Minimal Diameter - 5.9 x 5.9 mm Tortuosity - mild Calcification - mild Review of the MIP images confirms the above findings. IMPRESSION: 1. Vascular findings and measurements pertinent to potential TAVR procedure, as detailed above.  This patient does appear to have suitable pelvic arterial access bilaterally. 2. Mild thickening and calcification of the aortic valve. 3. Cardiomegaly with biatrial dilatation. Notably, there appears to be a small filling defect in the tip of the left atrial appendage. Further evaluation with transesophageal echocardiography is suggested in the near future (if clinically appropriate) to evaluate for potential left atrial appendage thrombus as this places the patient at risk for systemic embolization. 4. Small right partially loculated pleural effusion, with some fluid in the right major fissure. 5. Multiple small pulmonary nodules in the right lung measuring 4 mm or less in size, nonspecific, but strongly favored to be benign. No follow-up needed if patient is low-risk (and has no known or suspected primary neoplasm). Non-contrast chest CT can be considered in 12 months if patient is high-risk. This recommendation follows the consensus statement: Guidelines for Management of Incidental Pulmonary Nodules Detected on CT Images: From the Fleischner Society 2017; Radiology 2017; 284:228-243. 6. Additional incidental findings, as above. Aortic Atherosclerosis (ICD10-I70.0). Electronically Signed   By: Trudie Reed M.D.   On: 03/16/2017 08:56        Scheduled Meds: . calcitRIOL  0.25 mcg Oral Daily  . cholecalciferol  1,000 Units Oral Daily  . feeding supplement (GLUCERNA SHAKE)  237 mL Oral BID BM  . [START ON 03/18/2017] furosemide  60 mg Oral Daily  . insulin aspart  0-5 Units Subcutaneous QHS  . insulin aspart  0-9 Units Subcutaneous TID WC  . insulin glargine  5 Units Subcutaneous Daily  . lisinopril  5 mg Oral Daily  . metoprolol succinate  75 mg Oral BID  . potassium chloride  40 mEq Oral Daily  . QUEtiapine  25 mg Oral QHS  . rosuvastatin  10 mg Oral q1800  . sodium chloride flush  3 mL Intravenous Q12H  . vitamin B-12  1,000 mcg Oral Daily  . warfarin  7.5 mg Oral ONCE-1800  . Warfarin -  Pharmacist Dosing Inpatient   Does not apply q1800   Continuous Infusions: . sodium chloride    . heparin 800 Units/hr (03/16/17 2100)     LOS: 5 days    Treyvion Durkee Jaynie Collins, MD [facility] Pager [phone]  If 7PM-7AM, please contact night-coverage [email or web address] Password [email or web address] 03/17/2017, 12:19 PM

## 2017-03-17 NOTE — Progress Notes (Signed)
Occupational Therapy Treatment Patient Details Name: [name] MRN: [id number] DOB: October 02, 1926 Today's Date: 03/17/2017    History of present illness Pt is an 83 yo female with admitted with complaints of increasing dyspnea over the last week dx with congestive heart failure. Currently, preparing for TAVR. PMH for type 2 diabetes, hypertension, CHF (EF45% -   50%),  Pafib () Moderate Aortic stenosis , Mild MR, moderate pulm hypertension   OT comments  Pt. Was able to amb to bathroom with walker. pnt was min guard assist for hygiene in standing . Pt. Was mod a with Teaching laboratory technician. Pt. amb to siink and stood to wash hands 2 min. Pt. Was too fatigued to stand for remainng grooming tasks. Pt. amb to chair and was able to wash face and comb hair post setup.  Follow Up Recommendations       Equipment Recommendations       Recommendations for Other Services      Precautions / Restrictions Precautions Precautions: Fall Restrictions Weight Bearing Restrictions: No       Mobility Bed Mobility   Bed Mobility: Supine to Sit     Supine to sit: Min assist        Transfers       Sit to Stand: Min assist Stand pivot transfers: Min assist            Balance                                           ADL either performed or assessed with clinical judgement   ADL Overall ADL's : Needs assistance/impaired     Grooming: Wash/dry hands;Wash/dry face;Brushing hair;Set up;Sitting Grooming Details (indicate cue type and reason):  (pt. was too fatigued to perform in standing post toileting)         Upper Body Dressing : Moderate assistance;Sitting   Lower Body Dressing: Moderate assistance   Toilet Transfer: Minimal assistance;Ambulation;Regular Toilet;Grab bars   Toileting- Clothing Manipulation and Hygiene: Moderate assistance         General ADL Comments:  (pt. became too fatigued to stand at sink to comb hair. )     Vision        Perception     Praxis      Cognition Arousal/Alertness: Lethargic Behavior During Therapy: WFL for tasks assessed/performed Overall Cognitive Status: History of cognitive impairments - at baseline                                 General Comments: Pt with decreased recall.  requires mod cues for problem solving         Exercises     Shoulder Instructions       General Comments      Pertinent Vitals/ Pain       Pain Assessment: No/denies pain  Home Living                                          Prior Functioning/Environment              Frequency           Progress Toward Goals  OT Goals(current goals can now be found in the care  plan section)     Acute Rehab OT Goals Patient Stated Goal: go home  Plan      Co-evaluation                 AM-PAC PT "6 Clicks" Daily Activity     Outcome Measure   Help from another person eating meals?: None Help from another person taking care of personal grooming?: A Little Help from another person toileting, which includes using toliet, bedpan, or urinal?: A Lot Help from another person bathing (including washing, rinsing, drying)?: A Lot Help from another person to put on and taking off regular upper body clothing?: A Little Help from another person to put on and taking off regular lower body clothing?: A Lot 6 Click Score: 16    End of Session Equipment Utilized During Treatment: Gait belt;Rolling walker      Activity Tolerance Patient limited by fatigue   Patient Left in chair;with call bell/phone within reach;with family/visitor present   Nurse Communication Mobility status        Time: 8119-1478 OT Time Calculation (min): 43 min  Charges: OT General Charges $OT Visit: 1 Visit OT Treatments $Self Care/Home Management : 38-52 mins  6 clicks.   Coral Timme 03/17/2017, 9:29 AM

## 2017-03-18 LAB — CBC
HCT: 39.7 % (ref 36.0–46.0)
Hemoglobin: 12.8 g/dL (ref 12.0–15.0)
MCH: 30.2 pg (ref 26.0–34.0)
MCHC: 32.2 g/dL (ref 30.0–36.0)
MCV: 93.6 fL (ref 78.0–100.0)
PLATELETS: 196 10*3/uL (ref 150–400)
RBC: 4.24 MIL/uL (ref 3.87–5.11)
RDW: 14.9 % (ref 11.5–15.5)
WBC: 5.5 10*3/uL (ref 4.0–10.5)

## 2017-03-18 LAB — GLUCOSE, CAPILLARY
GLUCOSE-CAPILLARY: 139 mg/dL — AB (ref 65–99)
Glucose-Capillary: 125 mg/dL — ABNORMAL HIGH (ref 65–99)
Glucose-Capillary: 163 mg/dL — ABNORMAL HIGH (ref 65–99)
Glucose-Capillary: 166 mg/dL — ABNORMAL HIGH (ref 65–99)

## 2017-03-18 LAB — PROTIME-INR
INR: 1.43
PROTHROMBIN TIME: 17.4 s — AB (ref 11.4–15.2)

## 2017-03-18 LAB — BASIC METABOLIC PANEL
Anion gap: 7 (ref 5–15)
BUN: 19 mg/dL (ref 6–20)
CALCIUM: 10.6 mg/dL — AB (ref 8.9–10.3)
CHLORIDE: 108 mmol/L (ref 101–111)
CO2: 25 mmol/L (ref 22–32)
CREATININE: 1.14 mg/dL — AB (ref 0.44–1.00)
GFR calc non Af Amer: 42 mL/min — ABNORMAL LOW (ref >60)
GFR, EST AFRICAN AMERICAN: 48 mL/min — AB (ref >60)
Glucose, Bld: 127 mg/dL — ABNORMAL HIGH (ref 65–99)
Potassium: 4.1 mmol/L (ref 3.5–5.1)
SODIUM: 140 mmol/L (ref 135–145)

## 2017-03-18 LAB — HEPARIN LEVEL (UNFRACTIONATED)
HEPARIN UNFRACTIONATED: 0.8 [IU]/mL — AB (ref 0.30–0.70)
HEPARIN UNFRACTIONATED: 0.72 [IU]/mL — AB (ref 0.30–0.70)

## 2017-03-18 MED ORDER — WARFARIN SODIUM 5 MG PO TABS
5.0000 mg | ORAL_TABLET | Freq: Once | ORAL | Status: AC
  Administered 2017-03-18: 5 mg via ORAL
  Filled 2017-03-18: qty 1

## 2017-03-18 NOTE — Progress Notes (Signed)
Progress Note  Patient Name: [name] Date of Encounter: 03/18/2017  Primary Cardiologist: Dr Graciela Husbands  Subjective   Doing well today, the patient denies CP or SOB.  No new concerns  Inpatient Medications    Scheduled Meds: . calcitRIOL  0.25 mcg Oral Daily  . cholecalciferol  1,000 Units Oral Daily  . feeding supplement (GLUCERNA SHAKE)  237 mL Oral BID BM  . furosemide  60 mg Oral Daily  . insulin aspart  0-5 Units Subcutaneous QHS  . insulin aspart  0-9 Units Subcutaneous TID WC  . insulin glargine  5 Units Subcutaneous Daily  . lisinopril  5 mg Oral Daily  . metoprolol succinate  75 mg Oral BID  . potassium chloride  40 mEq Oral Daily  . QUEtiapine  25 mg Oral QHS  . rosuvastatin  10 mg Oral q1800  . sodium chloride flush  3 mL Intravenous Q12H  . vitamin B-12  1,000 mcg Oral Daily  . warfarin  5 mg Oral ONCE-1800  . Warfarin - Pharmacist Dosing Inpatient   Does not apply q1800   Continuous Infusions: . sodium chloride    . heparin 700 Units/hr (03/18/17 0837)   PRN Meds: sodium chloride, acetaminophen **OR** acetaminophen, hydrALAZINE, LORazepam, sodium chloride flush   Vital Signs    Vitals:   03/17/17 2118 03/18/17 0500 03/18/17 0839 03/18/17 1227  BP: (!) 97/50 109/60 109/68 108/77  Pulse: 92 82 89 86  Resp: 16 16  20   Temp: 98.6 F (37 C) (!) 97.5 F (36.4 C)  98.2 F (36.8 C)  TempSrc: Oral Oral  Oral  SpO2: 99% 95%  100%  Weight:  137 lb 11.2 oz (62.5 kg)    Height:        Intake/Output Summary (Last 24 hours) at 03/18/17 1430 Last data filed at 03/18/17 1338  Gross per 24 hour  Intake              580 ml  Output              950 ml  Net             -370 ml   Filed Weights   03/16/17 0418 03/17/17 0614 03/18/17 0500  Weight: 135 lb 11.2 oz (61.6 kg) 133 lb 3.2 oz (60.4 kg) 137 lb 11.2 oz (62.5 kg)    Physical Exam   GEN- The patient is elderly and frail appearing, sleeping but rouses Head- normocephalic, atraumatic Eyes-  Sclera  clear, conjunctiva pink Ears- hearing intact Oropharynx- clear Neck- supple, Lungs- Clear to ausculation bilaterally, normal work of breathing Heart- Regular rate and rhythm,3/6 SEM LUSB GI- soft, NT, ND, + BS Extremities- no clubbing, cyanosis, or edema, groin is without hematoma/ bruit MS- diffuse muscle atrophy Skin- no rash or lesion Psych- euthymic mood, full affect Neuro- strength and sensation are intact   Labs    Chemistry Recent Labs Lab 03/12/17 0258  03/16/17 0358 03/17/17 0445 03/18/17 0642  NA 145  < > 140 141 140  K 3.3*  < > 3.8 3.5 4.1  CL 111  < > 109 109 108  CO2 27  < > 26 26 25   GLUCOSE 161*  < > 142* 164* 127*  BUN 16  < > 20 20 19   CREATININE 1.01*  < > 1.13* 1.27* 1.14*  CALCIUM 10.0  < > 10.3 10.3 10.6*  PROT 6.6  --   --   --   --   ALBUMIN  3.3*  --   --   --   --   AST 19  --   --   --   --   ALT 25  --   --   --   --   ALKPHOS 59  --   --   --   --   BILITOT 1.2  --   --   --   --   GFRNONAA 48*  < > 42* 37* 42*  GFRAA 56*  < > 49* 42* 48*  ANIONGAP 7  < > 5 6 7   < > = values in this interval not displayed.   Hematology Recent Labs Lab 03/15/17 0628 03/17/17 0445 03/18/17 0642  WBC 5.4 5.7 5.5  RBC 4.35 4.36 4.24  HGB 12.9 12.8 12.8  HCT 41.5 41.3 39.7  MCV 95.4 94.7 93.6  MCH 29.7 29.4 30.2  MCHC 31.1 31.0 32.2  RDW 14.7 14.8 14.9  PLT 200 193 196    Cardiac Enzymes Recent Labs Lab 03/11/17 2143 03/12/17 0258 03/12/17 0925  TROPONINI 0.10* 0.12* 0.12*    Recent Labs Lab 03/11/17 1638  TROPIPOC 0.18*       Patient Profile  83 y.o.femalewith hx of diabetes mellitus, hyperlipidemia, atrial fibrillation, pacemaker for tachycardia-bradycardia syndrome, chronic kidney disease stage III here with acute on chronic heart failure due to moderate left ventricular dysfunction and critical aortic stenosis.   Assessment & Plan    1. Acute on chronic systolic dysfunction Clinically much improved Continue home lasix  dosing  2. Possible LV thrombus on chest CT Coumadin with heparin bridge Discharge to home once INR > 2  3. Severe AS Stable Outpatient TAVR workup  4. afib On coumadin  OK to discharge once INR is >2 Cardiology to see as needed over the weekend.  Hillis Range MD, [facility] 03/18/2017 2:30 PM

## 2017-03-18 NOTE — Progress Notes (Signed)
ANTICOAGULATION CONSULT NOTE - Follow Up Consult  Pharmacy Consult for Heparin and Coumadin Indication: atrial fibrillation and possible LV thrombus  No Known Allergies  Patient Measurements: Height: 5\' 3"  (160 cm) Weight: 137 lb 11.2 oz (62.5 kg) IBW/kg (Calculated) : 52.4 Heparin Dosing Weight: 60.4 kg  Vital Signs: Temp: 98.2 F (36.8 C) (09/01 1227) Temp Source: Oral (09/01 1227) BP: 108/77 (09/01 1227) Pulse Rate: 86 (09/01 1227)  Labs:  Recent Labs  03/16/17 0358  03/17/17 0445 03/18/17 0642 03/18/17 1430  HGB  --   --  12.8 12.8  --   HCT  --   --  41.3 39.7  --   PLT  --   --  193 196  --   LABPROT 15.0  --  14.8 17.4*  --   INR 1.19  --  1.17 1.43  --   HEPARINUNFRC  --   < > 0.68 0.80* 0.72*  CREATININE 1.13*  --  1.27* 1.14*  --   < > = values in this interval not displayed.  Estimated Creatinine Clearance: 28.2 mL/min (A) (by C-G formula based on SCr of 1.14 mg/dL (H)).  Assessment: 83 yr old female on Coumadin prior to admission for afib.  Coumadin held for cath, which had to be cancelled on 8/28 due to confusion. Heparin bridge added 8/30 for possible LV thrombus per CT of chest. -heparin level down to 0.72 on 700 units/hr  Home Coumadin regimen: 4 mg daily except 2 mg on Wed and Thurs. INR 1.76 on admit 8/25.  Goal of Therapy:  INR 2-3 Heparin level 0.3-0.7 units/ml Monitor platelets by anticoagulation protocol: Yes   Plan:  Decrease Heparin drip to 600 units/hr Warfarin 5 mg x1 Daily heparin level, INR and CBC.  Harland German, Pharm D 03/18/2017 4:04 PM

## 2017-03-18 NOTE — Progress Notes (Signed)
ANTICOAGULATION CONSULT NOTE - Follow Up Consult  Pharmacy Consult for Heparin and Coumadin Indication: atrial fibrillation and possible LV thrombus  No Known Allergies  Patient Measurements: Height: 5\' 3"  (160 cm) Weight: 137 lb 11.2 oz (62.5 kg) IBW/kg (Calculated) : 52.4 Heparin Dosing Weight: 60.4 kg  Vital Signs: Temp: 97.5 F (36.4 C) (09/01 0500) Temp Source: Oral (09/01 0500) BP: 109/60 (09/01 0500) Pulse Rate: 82 (09/01 0500)  Labs:  Recent Labs  03/16/17 0358 03/16/17 1927 03/17/17 0445 03/18/17 0642  HGB  --   --  12.8 12.8  HCT  --   --  41.3 39.7  PLT  --   --  193 196  LABPROT 15.0  --  14.8 17.4*  INR 1.19  --  1.17 1.43  HEPARINUNFRC  --  0.69 0.68 0.80*  CREATININE 1.13*  --  1.27* 1.14*    Estimated Creatinine Clearance: 28.2 mL/min (A) (by C-G formula based on SCr of 1.14 mg/dL (H)).  Assessment: 83 yr old female on Coumadin prior to admission for afib.  Coumadin held for cath, which had to be cancelled on 8/28 due to confusion. Heparin bridge added 8/30 for possible LV thrombus per CT of chest.  Heparin level supratherapeutic at 0.8, cbc stable, no bleeding noted. Will decrease heparin and get confirmatory level. INR sharply rose to 1.43 today after a dose of 7.5 mg of warfarin. Will lower today's warfarin dose and expect INR to continue to rise.  Home Coumadin regimen: 4 mg daily except 2 mg on Wed and Thurs. INR 1.76 on admit 8/25.  Goal of Therapy:  INR 2-3 Heparin level 0.3-0.7 units/ml Monitor platelets by anticoagulation protocol: Yes   Plan:  Decrease Heparin drip to 700 units/hr Warfarin 5 mg x1 HL recheck at 1430 Daily heparin level, INR and CBC. Monitor for s/sx of bleeding   Nolen Mu PharmD PGY1 Pharmacy Practice Resident 03/18/2017 8:33 AM Pager: [phone]

## 2017-03-18 NOTE — Progress Notes (Signed)
PROGRESS NOTE    [name]  GGY:[id number] DOB: 30-Oct-1926 DOA: 03/11/2017 PCP: Lorenda Ishihara, MD   Brief Narrative: 83 year old female with type 2 diabetes, hypertension, congestive heart failure,  atrial fibrillation on anticoagulation, aortic stenosis, presented with dyspnea for 1 week. In the ED patient was found to have elevated BNP consistent with congestive heart failure. Patient was admitted for further evaluation.  Assessment & Plan:   #Acute on chronic systolic congestive heart failure: -Shortness of breath improved.  -Continue oral Lasix. - Monitor urine output and BMP. Cardiology consult appreciated.  #Severe aortic stenosis/ LAA thrombus: Cardiac cath was canceled  due to pt's confusion. Mental status improved and around baseline. Patient's son at bedside -CT coronary angiogram consistent with possible left atrial thrombus. Started on IV heparin for bridging and continuing Coumadin by cardiologist. Continue to monitor INR. Discharge patient home when INR is more than 2. INR 1.4 today.  #Hypokalemia: Improved. Monitor BMP.  #Nonsustained V. tach: asymptomatic. Maintain electrolytes.  #Permanent atrial fibrillation: Monitor heart rate. Currently on IV heparin and Coumadin. Monitor INR.  #Hypertension: Monitor blood pressure. Continue lisinopril, metoprolol and current medication.   #Diabetes with hyperglycemia: Continue current insulin regimen. Monitor blood sugar level.  #Acute metabolic encephalopathy. Currently mental status at baseline. Nonfocal neurologic exam. CTH with chronic changes. PT OT evaluation and treatment.  DVT prophylaxis: Coumadin and IV heparin. Code Status: Full code Family Communication: Discussed with the patient's son at bedside Disposition Plan: Likely discharge home in 1-2 days pending cardiology workup    Consultants:   Cardiology  Procedures: None Antimicrobials: None  Subjective: Seen and examined at bedside. No new  event. Denied headache, dizziness, chest pain or shortness of breath. Objective: Vitals:   03/17/17 0614 03/17/17 2118 03/18/17 0500 03/18/17 0839  BP: 130/82 (!) 97/50 109/60 109/68  Pulse: 83 92 82 89  Resp: 18 16 16    Temp: 98.5 F (36.9 C) 98.6 F (37 C) (!) 97.5 F (36.4 C)   TempSrc: Oral Oral Oral   SpO2: 100% 99% 95%   Weight: 60.4 kg (133 lb 3.2 oz)  62.5 kg (137 lb 11.2 oz)   Height:        Intake/Output Summary (Last 24 hours) at 03/18/17 1155 Last data filed at 03/18/17 1055  Gross per 24 hour  Intake              400 ml  Output              950 ml  Net             -550 ml   Filed Weights   03/16/17 0418 03/17/17 0614 03/18/17 0500  Weight: 61.6 kg (135 lb 11.2 oz) 60.4 kg (133 lb 3.2 oz) 62.5 kg (137 lb 11.2 oz)    Examination:  General exam: Pleasant elderly female lying on bed comfortable, not in distress Respiratory system: Clear bilateral, no wheezing or crackle Cardiovascular system: Regular rate and rhythm, systolic murmur Gastrointestinal system: Abdomen soft, nontender, nondistended. Bowel sound positive Central nervous system: Following commands Extremities: Symmetric 5 x 5 power. Skin: No rashes, lesions or ulcers     Data Reviewed: I have personally reviewed following labs and imaging studies  CBC:  Recent Labs Lab 03/12/17 0258 03/12/17 1412 03/13/17 0434 03/15/17 0628 03/17/17 0445 03/18/17 0642  WBC 6.1  --  6.4 5.4 5.7 5.5  HGB 12.5  --  12.9 12.9 12.8 12.8  HCT 39.2 39.9 40.4 41.5 41.3 39.7  MCV 93.6  --  94.6 95.4 94.7 93.6  PLT 169  --  178 200 193 196   Basic Metabolic Panel:  Recent Labs Lab 03/12/17 1412  03/14/17 0546 03/15/17 0628 03/16/17 0358 03/17/17 0445 03/18/17 0642  NA  --   < > 142 145 140 141 140  K  --   < > 3.6 3.4* 3.8 3.5 4.1  CL  --   < > 108 109 109 109 108  CO2  --   < > 24 26 26 26 25   GLUCOSE  --   < > 214* 145* 142* 164* 127*  BUN  --   < > 21* 14 20 20 19   CREATININE  --   < > 1.20*  1.07* 1.13* 1.27* 1.14*  CALCIUM  --   < > 10.0 10.2 10.3 10.3 10.6*  MG 2.2  --  2.2 2.2  --   --   --   < > = values in this interval not displayed. GFR: Estimated Creatinine Clearance: 28.2 mL/min (A) (by C-G formula based on SCr of 1.14 mg/dL (H)). Liver Function Tests:  Recent Labs Lab 03/12/17 0258  AST 19  ALT 25  ALKPHOS 59  BILITOT 1.2  PROT 6.6  ALBUMIN 3.3*   No results for input(s): LIPASE, AMYLASE in the last 168 hours.  Recent Labs Lab 03/12/17 1412  AMMONIA 29   Coagulation Profile:  Recent Labs Lab 03/14/17 0546 03/15/17 0628 03/16/17 0358 03/17/17 0445 03/18/17 0642  INR 1.35 1.16 1.19 1.17 1.43   Cardiac Enzymes:  Recent Labs Lab 03/11/17 2143 03/12/17 0258 03/12/17 0925  TROPONINI 0.10* 0.12* 0.12*   BNP (last 3 results) No results for input(s): PROBNP in the last 8760 hours. HbA1C: No results for input(s): HGBA1C in the last 72 hours. CBG:  Recent Labs Lab 03/17/17 1112 03/17/17 1629 03/17/17 2120 03/18/17 0733 03/18/17 1137  GLUCAP 237* 303* 237* 125* 163*   Lipid Profile: No results for input(s): CHOL, HDL, LDLCALC, TRIG, CHOLHDL, LDLDIRECT in the last 72 hours. Thyroid Function Tests: No results for input(s): TSH, T4TOTAL, FREET4, T3FREE, THYROIDAB in the last 72 hours. Anemia Panel: No results for input(s): VITAMINB12, FOLATE, FERRITIN, TIBC, IRON, RETICCTPCT in the last 72 hours. Sepsis Labs: No results for input(s): PROCALCITON, LATICACIDVEN in the last 168 hours.  Recent Results (from the past 240 hour(s))  Urine Culture     Status: Abnormal   Collection Time: 03/13/17  5:10 AM  Result Value Ref Range Status   Specimen Description URINE, RANDOM  Final   Special Requests NONE  Final   Culture MULTIPLE SPECIES PRESENT, SUGGEST RECOLLECTION (A)  Final   Report Status 03/14/2017 FINAL  Final         Radiology Studies: No results found.      Scheduled Meds: . calcitRIOL  0.25 mcg Oral Daily  .  cholecalciferol  1,000 Units Oral Daily  . feeding supplement (GLUCERNA SHAKE)  237 mL Oral BID BM  . furosemide  60 mg Oral Daily  . insulin aspart  0-5 Units Subcutaneous QHS  . insulin aspart  0-9 Units Subcutaneous TID WC  . insulin glargine  5 Units Subcutaneous Daily  . lisinopril  5 mg Oral Daily  . metoprolol succinate  75 mg Oral BID  . potassium chloride  40 mEq Oral Daily  . QUEtiapine  25 mg Oral QHS  . rosuvastatin  10 mg Oral q1800  . sodium chloride flush  3 mL Intravenous Q12H  . vitamin B-12  1,000  mcg Oral Daily  . warfarin  5 mg Oral ONCE-1800  . Warfarin - Pharmacist Dosing Inpatient   Does not apply q1800   Continuous Infusions: . sodium chloride    . heparin 700 Units/hr (03/18/17 0837)     LOS: 6 days    Irania Durell Jaynie Collins, MD [facility] Pager [phone]  If 7PM-7AM, please contact night-coverage [email or web address] Password [email or web address] 03/18/2017, 11:55 AM

## 2017-03-19 LAB — PROTIME-INR
INR: 1.65
Prothrombin Time: 19.4 seconds — ABNORMAL HIGH (ref 11.4–15.2)

## 2017-03-19 LAB — GLUCOSE, CAPILLARY
GLUCOSE-CAPILLARY: 153 mg/dL — AB (ref 65–99)
GLUCOSE-CAPILLARY: 215 mg/dL — AB (ref 65–99)
Glucose-Capillary: 161 mg/dL — ABNORMAL HIGH (ref 65–99)
Glucose-Capillary: 208 mg/dL — ABNORMAL HIGH (ref 65–99)

## 2017-03-19 LAB — HEPARIN LEVEL (UNFRACTIONATED): HEPARIN UNFRACTIONATED: 0.42 [IU]/mL (ref 0.30–0.70)

## 2017-03-19 MED ORDER — POTASSIUM CHLORIDE CRYS ER 20 MEQ PO TBCR
40.0000 meq | EXTENDED_RELEASE_TABLET | Freq: Every day | ORAL | Status: DC
  Administered 2017-03-19 – 2017-03-21 (×3): 40 meq via ORAL
  Filled 2017-03-19 (×3): qty 2

## 2017-03-19 MED ORDER — WARFARIN SODIUM 5 MG PO TABS
5.0000 mg | ORAL_TABLET | Freq: Once | ORAL | Status: AC
  Administered 2017-03-19: 5 mg via ORAL
  Filled 2017-03-19: qty 1

## 2017-03-19 NOTE — Progress Notes (Signed)
ANTICOAGULATION CONSULT NOTE - Follow Up Consult  Pharmacy Consult for Heparin and Coumadin Indication: atrial fibrillation and possible LV thrombus  No Known Allergies  Patient Measurements: Height: 5\' 3"  (160 cm) Weight: 135 lb 9.6 oz (61.5 kg) IBW/kg (Calculated) : 52.4 Heparin Dosing Weight: 60.4 kg  Vital Signs: Temp: 97.7 F (36.5 C) (09/02 0515) Temp Source: Oral (09/02 0515) BP: 130/77 (09/02 0515) Pulse Rate: 76 (09/02 0515)  Labs:  Recent Labs  03/17/17 0445 03/18/17 0642 03/18/17 1430 03/19/17 0524  HGB 12.8 12.8  --   --   HCT 41.3 39.7  --   --   PLT 193 196  --   --   LABPROT 14.8 17.4*  --  19.4*  INR 1.17 1.43  --  1.65  HEPARINUNFRC 0.68 0.80* 0.72* 0.42  CREATININE 1.27* 1.14*  --   --     Estimated Creatinine Clearance: 28.2 mL/min (A) (by C-G formula based on SCr of 1.14 mg/dL (H)).  Assessment: 83 yr old female on Coumadin prior to admission for afib.  Coumadin held for cath, which had to be cancelled on 8/28 due to confusion. Heparin bridge added 8/30 for possible LV thrombus per CT of chest. -heparin level 0.42 on 600 units/hr -INR= 1.65  Home Coumadin regimen: 4 mg daily except 2 mg on Wed and Thurs. INR 1.76 on admit 8/25.  Goal of Therapy:  INR 2-3 Heparin level 0.3-0.7 units/ml Monitor platelets by anticoagulation protocol: Yes   Plan:  Continue heparin at 600 units/hr Warfarin 5 mg x1 Daily heparin level, INR and CBC.  Harland German, Pharm D 03/19/2017 10:37 AM

## 2017-03-19 NOTE — Progress Notes (Signed)
PROGRESS NOTE    [name]  RUE:[id number] DOB: 12-17-1926 DOA: 03/11/2017 PCP: Lorenda Ishihara, MD   Brief Narrative: 83 year old female with type 2 diabetes, hypertension, congestive heart failure,  atrial fibrillation on anticoagulation, aortic stenosis, presented with dyspnea for 1 week. In the ED patient was found to have elevated BNP consistent with congestive heart failure. Patient was admitted for further evaluation.  Continue current treatment and management. INR is 1.65 today. Plan to discharge home when INR is more than 2. Continue Coumadin and IV heparin. Patient is clinically stable. Discussed with the patient's daughters at bedside  Assessment & Plan:   #Acute on chronic systolic congestive heart failure: -Shortness of breath improved.  -Continue oral Lasix. - Monitor urine output and BMP. Cardiology consult appreciated.  #Severe aortic stenosis/ LAA thrombus: Cardiac cath was canceled  due to pt's confusion. Mental status improved and around baseline. Patient's son at bedside -CT coronary angiogram consistent with possible left atrial thrombus. Started on IV heparin for bridging and continuing Coumadin by cardiologist. Continue to monitor INR. Discharge patient home when INR is more than 2. INR 1.6 today.  #Hypokalemia: Improved. Monitor BMP.  #Nonsustained V. tach: asymptomatic. Maintain electrolytes.  #Permanent atrial fibrillation: Monitor heart rate. Currently on IV heparin and Coumadin. Monitor INR.  #Hypertension: Monitor blood pressure. Continue lisinopril, metoprolol and current medication.   #Diabetes with hyperglycemia: Continue current insulin regimen. Monitor blood sugar level.  #Acute metabolic encephalopathy. Currently mental status at baseline. Nonfocal neurologic exam. CTH with chronic changes. PT OT evaluation and treatment.  DVT prophylaxis: Coumadin and IV heparin. Code Status: Full code Family Communication: Discussed with the  patient's son at bedside Disposition Plan: Likely discharge home in 1-2 days pending cardiology workup    Consultants:   Cardiology  Procedures: None Antimicrobials: None  Subjective: Seen and examined at bedside. No new event. Denied headache, dizziness, chest pain or shortness of breath. Objective: Vitals:   03/18/17 1227 03/18/17 2127 03/18/17 2217 03/19/17 0515  BP: 108/77 116/76 111/68 130/77  Pulse: 86 98 90 76  Resp: 20 20  20   Temp: 98.2 F (36.8 C) 98.1 F (36.7 C)  97.7 F (36.5 C)  TempSrc: Oral Oral  Oral  SpO2: 100% 100%  100%  Weight:    61.5 kg (135 lb 9.6 oz)  Height:        Intake/Output Summary (Last 24 hours) at 03/19/17 1135 Last data filed at 03/19/17 0953  Gross per 24 hour  Intake            761.3 ml  Output             1601 ml  Net           -839.7 ml   Filed Weights   03/17/17 0614 03/18/17 0500 03/19/17 0515  Weight: 60.4 kg (133 lb 3.2 oz) 62.5 kg (137 lb 11.2 oz) 61.5 kg (135 lb 9.6 oz)    Examination:  General exam: Not in distress Respiratory system: Clear bilateral, no wheezing or crackle Cardiovascular system: Regular rate rhythm systolic murmur Gastrointestinal system: Abdomen soft, nontender, nondistended. Bowel sound positive Central nervous system: Following commands Extremities: Symmetric 5 x 5 power. Skin: No rashes, lesions or ulcers     Data Reviewed: I have personally reviewed following labs and imaging studies  CBC:  Recent Labs Lab 03/12/17 1412 03/13/17 0434 03/15/17 0628 03/17/17 0445 03/18/17 0642  WBC  --  6.4 5.4 5.7 5.5  HGB  --  12.9 12.9 12.8  12.8  HCT 39.9 40.4 41.5 41.3 39.7  MCV  --  94.6 95.4 94.7 93.6  PLT  --  178 200 193 196   Basic Metabolic Panel:  Recent Labs Lab 03/12/17 1412  03/14/17 0546 03/15/17 0628 03/16/17 0358 03/17/17 0445 03/18/17 0642  NA  --   < > 142 145 140 141 140  K  --   < > 3.6 3.4* 3.8 3.5 4.1  CL  --   < > 108 109 109 109 108  CO2  --   < > 24 26 26 26  25   GLUCOSE  --   < > 214* 145* 142* 164* 127*  BUN  --   < > 21* 14 20 20 19   CREATININE  --   < > 1.20* 1.07* 1.13* 1.27* 1.14*  CALCIUM  --   < > 10.0 10.2 10.3 10.3 10.6*  MG 2.2  --  2.2 2.2  --   --   --   < > = values in this interval not displayed. GFR: Estimated Creatinine Clearance: 28.2 mL/min (A) (by C-G formula based on SCr of 1.14 mg/dL (H)). Liver Function Tests: No results for input(s): AST, ALT, ALKPHOS, BILITOT, PROT, ALBUMIN in the last 168 hours. No results for input(s): LIPASE, AMYLASE in the last 168 hours.  Recent Labs Lab 03/12/17 1412  AMMONIA 29   Coagulation Profile:  Recent Labs Lab 03/15/17 0628 03/16/17 0358 03/17/17 0445 03/18/17 0642 03/19/17 0524  INR 1.16 1.19 1.17 1.43 1.65   Cardiac Enzymes: No results for input(s): CKTOTAL, CKMB, CKMBINDEX, TROPONINI in the last 168 hours. BNP (last 3 results) No results for input(s): PROBNP in the last 8760 hours. HbA1C: No results for input(s): HGBA1C in the last 72 hours. CBG:  Recent Labs Lab 03/18/17 0733 03/18/17 1137 03/18/17 1626 03/18/17 2130 03/19/17 0733  GLUCAP 125* 163* 139* 166* 161*   Lipid Profile: No results for input(s): CHOL, HDL, LDLCALC, TRIG, CHOLHDL, LDLDIRECT in the last 72 hours. Thyroid Function Tests: No results for input(s): TSH, T4TOTAL, FREET4, T3FREE, THYROIDAB in the last 72 hours. Anemia Panel: No results for input(s): VITAMINB12, FOLATE, FERRITIN, TIBC, IRON, RETICCTPCT in the last 72 hours. Sepsis Labs: No results for input(s): PROCALCITON, LATICACIDVEN in the last 168 hours.  Recent Results (from the past 240 hour(s))  Urine Culture     Status: Abnormal   Collection Time: 03/13/17  5:10 AM  Result Value Ref Range Status   Specimen Description URINE, RANDOM  Final   Special Requests NONE  Final   Culture MULTIPLE SPECIES PRESENT, SUGGEST RECOLLECTION (A)  Final   Report Status 03/14/2017 FINAL  Final         Radiology Studies: No results  found.      Scheduled Meds: . calcitRIOL  0.25 mcg Oral Daily  . cholecalciferol  1,000 Units Oral Daily  . feeding supplement (GLUCERNA SHAKE)  237 mL Oral BID BM  . furosemide  60 mg Oral Daily  . insulin aspart  0-5 Units Subcutaneous QHS  . insulin aspart  0-9 Units Subcutaneous TID WC  . insulin glargine  5 Units Subcutaneous Daily  . lisinopril  5 mg Oral Daily  . metoprolol succinate  75 mg Oral BID  . potassium chloride SA  40 mEq Oral Daily  . QUEtiapine  25 mg Oral QHS  . rosuvastatin  10 mg Oral q1800  . sodium chloride flush  3 mL Intravenous Q12H  . vitamin B-12  1,000 mcg Oral  Daily  . warfarin  5 mg Oral ONCE-1800  . Warfarin - Pharmacist Dosing Inpatient   Does not apply q1800   Continuous Infusions: . sodium chloride    . heparin 600 Units/hr (03/19/17 0900)     LOS: 7 days    Dione Petron Jaynie Collins, MD [facility] Pager [phone]  If 7PM-7AM, please contact night-coverage [email or web address] Password [email or web address] 03/19/2017, 11:35 AM

## 2017-03-20 LAB — CBC
HCT: 38.8 % (ref 36.0–46.0)
Hemoglobin: 12.1 g/dL (ref 12.0–15.0)
MCH: 29.5 pg (ref 26.0–34.0)
MCHC: 31.2 g/dL (ref 30.0–36.0)
MCV: 94.6 fL (ref 78.0–100.0)
PLATELETS: 192 10*3/uL (ref 150–400)
RBC: 4.1 MIL/uL (ref 3.87–5.11)
RDW: 15.1 % (ref 11.5–15.5)
WBC: 5.1 10*3/uL (ref 4.0–10.5)

## 2017-03-20 LAB — GLUCOSE, CAPILLARY
GLUCOSE-CAPILLARY: 236 mg/dL — AB (ref 65–99)
GLUCOSE-CAPILLARY: 243 mg/dL — AB (ref 65–99)
Glucose-Capillary: 105 mg/dL — ABNORMAL HIGH (ref 65–99)
Glucose-Capillary: 149 mg/dL — ABNORMAL HIGH (ref 65–99)

## 2017-03-20 LAB — HEPARIN LEVEL (UNFRACTIONATED): Heparin Unfractionated: 0.33 IU/mL (ref 0.30–0.70)

## 2017-03-20 LAB — PROTIME-INR
INR: 1.87
PROTHROMBIN TIME: 21.3 s — AB (ref 11.4–15.2)

## 2017-03-20 MED ORDER — WARFARIN SODIUM 5 MG PO TABS
5.0000 mg | ORAL_TABLET | Freq: Once | ORAL | Status: AC
  Administered 2017-03-20: 5 mg via ORAL
  Filled 2017-03-20: qty 1

## 2017-03-20 NOTE — Progress Notes (Signed)
ANTICOAGULATION CONSULT NOTE - Follow Up Consult  Pharmacy Consult for Heparin and Coumadin Indication: atrial fibrillation and possible LV thrombus  No Known Allergies  Patient Measurements: Height: 5\' 3"  (160 cm) Weight: 134 lb 8 oz (61 kg) IBW/kg (Calculated) : 52.4 Heparin Dosing Weight: 60.4 kg  Vital Signs: Temp: 97.8 F (36.6 C) (09/03 0500) Temp Source: Oral (09/03 0500) BP: 116/69 (09/03 0500) Pulse Rate: 83 (09/03 0500)  Labs:  Recent Labs  03/18/17 0642 03/18/17 1430 03/19/17 0524 03/20/17 0343  HGB 12.8  --   --  12.1  HCT 39.7  --   --  38.8  PLT 196  --   --  192  LABPROT 17.4*  --  19.4* 21.3*  INR 1.43  --  1.65 1.87  HEPARINUNFRC 0.80* 0.72* 0.42 0.33  CREATININE 1.14*  --   --   --     Estimated Creatinine Clearance: 28.2 mL/min (A) (by C-G formula based on SCr of 1.14 mg/dL (H)).  Assessment: 83 yr old female on Coumadin prior to admission for afib.  Coumadin held for cath, which had to be cancelled on 8/28 due to confusion. Heparin bridge added 8/30 for possible LV thrombus per CT of chest. -heparin level 0.33 on 600 units/hr -INR= 1.87 and trending nicely - expect therapeutic response tomorrow.  Home Coumadin regimen: 4 mg daily except 2 mg on Wed and Thurs. INR 1.76 on admit 8/25.  Goal of Therapy:  INR 2-3 Heparin level 0.3-0.7 units/ml Monitor platelets by anticoagulation protocol: Yes   Plan:  Continue heparin at 600 units/hr Warfarin 5 mg x1 Daily heparin level, INR and CBC.  Nadara Mustard, PharmD., MS Clinical Pharmacist Pager:  [phone] Thank you for allowing pharmacy to be part of this patients care team. 03/20/2017 8:09 AM

## 2017-03-20 NOTE — Progress Notes (Signed)
Physical Therapy Treatment Patient Details Name: [name] MRN: [id number] DOB: Jun 08, 1927 Today's Date: 03/20/2017    History of Present Illness Pt is an 83 yo female with admitted with complaints of increasing dyspnea over the last week dx with congestive heart failure. Currently, preparing for TAVR. PMH for type 2 diabetes, hypertension, CHF (EF45% -   50%),  Pafib () Moderate Aortic stenosis , Mild MR, moderate pulm hypertension    PT Comments    Pt limited by fatigue this session and only able to tolerate short distance in room. Continues to require min guard to min A for mobility this session with continuous cues for appropriate RW use. Pt did participate in LE exercise this session and tolerated well.  Current recommendations remain appropriate. Will continue to follow acutely to maximize functional mobility independence and safety.   Follow Up Recommendations  Home health PT;Supervision/Assistance - 24 hour ([facility] )     Equipment Recommendations  None recommended by PT    Recommendations for Other Services       Precautions / Restrictions Precautions Precautions: Fall Restrictions Weight Bearing Restrictions: No    Mobility  Bed Mobility Overal bed mobility: Needs Assistance Bed Mobility: Sit to Supine       Sit to supine: Min assist   General bed mobility comments: Min A for LE lift assist   Transfers Overall transfer level: Needs assistance Equipment used: Rolling walker (2 wheeled) Transfers: Sit to/from Stand Sit to Stand: Min guard         General transfer comment: Min guard for steadying assist. Increased time and effort required   Ambulation/Gait Ambulation/Gait assistance: Min guard Ambulation Distance (Feet): 15 Feet Assistive device: Rolling walker (2 wheeled) Gait Pattern/deviations: Step-through pattern;Decreased step length - right;Decreased step length - left;Trunk flexed;Shuffle Gait velocity: slowed Gait velocity interpretation:  Below normal speed for age/gender General Gait Details: Min Guard for safety. Continuous verbal cues for appropriate use of RW as pt wanting to take hands off of RW and proximity to device. Very limited this session secondadry to fatigue. Required seated rest X 1.    Stairs            Wheelchair Mobility    Modified Rankin (Stroke Patients Only)       Balance Overall balance assessment: Needs assistance Sitting-balance support: Feet supported Sitting balance-Leahy Scale: Good     Standing balance support: During functional activity;Single extremity supported Standing balance-Leahy Scale: Poor Standing balance comment: Required UE support to maintain balance. Fatigued easily with standing while PT assisted to pull up brief.                             Cognition Arousal/Alertness: Awake/alert Behavior During Therapy: WFL for tasks assessed/performed Overall Cognitive Status: History of cognitive impairments - at baseline                                 General Comments: Pt with decreased recall.  requires mod cues for problem solving and attention to task       Exercises General Exercises - Lower Extremity Ankle Circles/Pumps: AROM;Both;10 reps;Supine Long Arc Quad: AROM;Both;10 reps;Seated Hip ABduction/ADduction: AROM;Both;10 reps;Supine Hip Flexion/Marching: AROM;Both;10 reps;Seated    General Comments General comments (skin integrity, edema, etc.): Pt's daughter present throughout session.       Pertinent Vitals/Pain Pain Assessment: No/denies pain    Home Living  Prior Function            PT Goals (current goals can now be found in the care plan section) Acute Rehab PT Goals Patient Stated Goal: go home PT Goal Formulation: With patient Time For Goal Achievement: 03/22/17 Potential to Achieve Goals: Fair Progress towards PT goals: Not progressing toward goals - comment (limited by fatigue )     Frequency    Min 3X/week      PT Plan Current plan remains appropriate    Co-evaluation              AM-PAC PT "6 Clicks" Daily Activity  Outcome Measure  Difficulty turning over in bed (including adjusting bedclothes, sheets and blankets)?: A Lot Difficulty moving from lying on back to sitting on the side of the bed? : Unable Difficulty sitting down on and standing up from a chair with arms (e.g., wheelchair, bedside commode, etc,.)?: A Little Help needed moving to and from a bed to chair (including a wheelchair)?: A Little Help needed walking in hospital room?: A Little Help needed climbing 3-5 steps with a railing? : A Lot 6 Click Score: 14    End of Session Equipment Utilized During Treatment: Gait belt Activity Tolerance: Patient limited by fatigue Patient left: in bed;with call bell/phone within reach;with bed alarm set Nurse Communication: Mobility status PT Visit Diagnosis: Other abnormalities of gait and mobility (R26.89);Muscle weakness (generalized) (M62.81)     Time: 1610-9604 PT Time Calculation (min) (ACUTE ONLY): 23 min  Charges:  $Therapeutic Exercise: 8-22 mins $Therapeutic Activity: 8-22 mins                    G Codes:       Gladys Damme, PT, DPT  Acute Rehabilitation Services  Pager: [phone]    Lehman Prom 03/20/2017, 3:56 PM

## 2017-03-20 NOTE — Progress Notes (Signed)
PROGRESS NOTE    [name]  ONG:[id number] DOB: [birth year] DOA: 03/11/2017 PCP: Lorenda Ishihara, MD   Brief Narrative: 83 year old female with type 2 diabetes, hypertension, congestive heart failure,  atrial fibrillation on anticoagulation, aortic stenosis, presented with dyspnea for 1 week. In the ED patient was found to have elevated BNP consistent with congestive heart failure. Patient was admitted for further evaluation.  Continue current treatment and management. INR is 1.87 today. Plan to discharge home when INR is more than 2. Continue Coumadin and IV heparin. Patient is clinically stable. Discussed with the patient's daughter at bedside. Try to move out of bed to chair today.  Assessment & Plan:   #Acute on chronic systolic congestive heart failure: -Shortness of breath improved.  -Continue oral Lasix. - Monitor urine output and BMP. Cardiology consult appreciated.  #Severe aortic stenosis/ LAA thrombus: Cardiac cath was canceled  due to pt's confusion. Mental status improved and around baseline. Patient's son at bedside -CT coronary angiogram consistent with possible left atrial thrombus. Started on IV heparin for bridging and continuing Coumadin by cardiologist. Continue to monitor INR. Discharge patient home when INR is more than 2. INR 1.8 today.  #Hypokalemia: Improved. Monitor BMP.  #Nonsustained V. tach: asymptomatic. Maintain electrolytes.  #Permanent atrial fibrillation: Monitor heart rate. Currently on IV heparin and Coumadin. Monitor INR.  #Hypertension: Monitor blood pressure. Continue lisinopril, metoprolol and current medication.   #Diabetes with hyperglycemia: Continue current insulin regimen. Monitor blood sugar level.  #Acute metabolic encephalopathy. Currently mental status at baseline. Nonfocal neurologic exam. CTH with chronic changes. PT OT evaluation and treatment.  DVT prophylaxis: Coumadin and IV heparin. Code Status: Full code Family  Communication: Discussed with the patient's son at bedside Disposition Plan: Likely discharge home in 1-2 days pending cardiology workup    Consultants:   Cardiology  Procedures: None Antimicrobials: None  Subjective: Seen and examined at bedside. No new event. Continue to denied chest pain, shortness of breath, nausea or vomiting. Objective: Vitals:   03/19/17 2229 03/20/17 0500 03/20/17 1000 03/20/17 1212  BP: 131/86 116/69 114/62 (!) 115/92  Pulse: 87 83 67 84  Resp:  16  18  Temp:  97.8 F (36.6 C)  98.9 F (37.2 C)  TempSrc:  Oral  Oral  SpO2:  97%  100%  Weight:  61 kg (134 lb 8 oz)    Height:        Intake/Output Summary (Last 24 hours) at 03/20/17 1310 Last data filed at 03/20/17 0859  Gross per 24 hour  Intake           1054.7 ml  Output             1000 ml  Net             54.7 ml   Filed Weights   03/18/17 0500 03/19/17 0515 03/20/17 0500  Weight: 62.5 kg (137 lb 11.2 oz) 61.5 kg (135 lb 9.6 oz) 61 kg (134 lb 8 oz)    Examination:  General exam: Pleasant elderly female lying on bed comfortable Respiratory system: Clear bilateral, no wheezing or crackle Cardiovascular system: Regular rate rhythm, systolic murmur appreciated Gastrointestinal system: Abdomen soft, nontender, nondistended. Bowel sound positive Central nervous system: Following commands Extremities: Symmetric 5 x 5 power. Skin: No rashes, lesions or ulcers     Data Reviewed: I have personally reviewed following labs and imaging studies  CBC:  Recent Labs Lab 03/15/17 0628 03/17/17 0445 03/18/17 0642 03/20/17 0343  WBC 5.4 5.7  5.5 5.1  HGB 12.9 12.8 12.8 12.1  HCT 41.5 41.3 39.7 38.8  MCV 95.4 94.7 93.6 94.6  PLT 200 193 196 192   Basic Metabolic Panel:  Recent Labs Lab 03/14/17 0546 03/15/17 0628 03/16/17 0358 03/17/17 0445 03/18/17 0642  NA 142 145 140 141 140  K 3.6 3.4* 3.8 3.5 4.1  CL 108 109 109 109 108  CO2 24 26 26 26 25   GLUCOSE 214* 145* 142* 164* 127*    BUN 21* 14 20 20 19   CREATININE 1.20* 1.07* 1.13* 1.27* 1.14*  CALCIUM 10.0 10.2 10.3 10.3 10.6*  MG 2.2 2.2  --   --   --    GFR: Estimated Creatinine Clearance: 28.2 mL/min (A) (by C-G formula based on SCr of 1.14 mg/dL (H)). Liver Function Tests: No results for input(s): AST, ALT, ALKPHOS, BILITOT, PROT, ALBUMIN in the last 168 hours. No results for input(s): LIPASE, AMYLASE in the last 168 hours. No results for input(s): AMMONIA in the last 168 hours. Coagulation Profile:  Recent Labs Lab 03/16/17 0358 03/17/17 0445 03/18/17 0642 03/19/17 0524 03/20/17 0343  INR 1.19 1.17 1.43 1.65 1.87   Cardiac Enzymes: No results for input(s): CKTOTAL, CKMB, CKMBINDEX, TROPONINI in the last 168 hours. BNP (last 3 results) No results for input(s): PROBNP in the last 8760 hours. HbA1C: No results for input(s): HGBA1C in the last 72 hours. CBG:  Recent Labs Lab 03/19/17 1151 03/19/17 1631 03/19/17 2056 03/20/17 0729 03/20/17 1120  GLUCAP 153* 215* 208* 105* 149*   Lipid Profile: No results for input(s): CHOL, HDL, LDLCALC, TRIG, CHOLHDL, LDLDIRECT in the last 72 hours. Thyroid Function Tests: No results for input(s): TSH, T4TOTAL, FREET4, T3FREE, THYROIDAB in the last 72 hours. Anemia Panel: No results for input(s): VITAMINB12, FOLATE, FERRITIN, TIBC, IRON, RETICCTPCT in the last 72 hours. Sepsis Labs: No results for input(s): PROCALCITON, LATICACIDVEN in the last 168 hours.  Recent Results (from the past 240 hour(s))  Urine Culture     Status: Abnormal   Collection Time: 03/13/17  5:10 AM  Result Value Ref Range Status   Specimen Description URINE, RANDOM  Final   Special Requests NONE  Final   Culture MULTIPLE SPECIES PRESENT, SUGGEST RECOLLECTION (A)  Final   Report Status 03/14/2017 FINAL  Final         Radiology Studies: No results found.      Scheduled Meds: . calcitRIOL  0.25 mcg Oral Daily  . cholecalciferol  1,000 Units Oral Daily  . feeding  supplement (GLUCERNA SHAKE)  237 mL Oral BID BM  . furosemide  60 mg Oral Daily  . insulin aspart  0-5 Units Subcutaneous QHS  . insulin aspart  0-9 Units Subcutaneous TID WC  . insulin glargine  5 Units Subcutaneous Daily  . lisinopril  5 mg Oral Daily  . metoprolol succinate  75 mg Oral BID  . potassium chloride SA  40 mEq Oral Daily  . QUEtiapine  25 mg Oral QHS  . rosuvastatin  10 mg Oral q1800  . sodium chloride flush  3 mL Intravenous Q12H  . vitamin B-12  1,000 mcg Oral Daily  . warfarin  5 mg Oral ONCE-1800  . Warfarin - Pharmacist Dosing Inpatient   Does not apply q1800   Continuous Infusions: . sodium chloride    . heparin 600 Units/hr (03/19/17 1811)     LOS: 8 days    Dron Jaynie Collins, MD [facility] Pager [phone]  If 7PM-7AM, please contact night-coverage [email or web address]  Password [email or web address] 03/20/2017, 1:10 PM

## 2017-03-21 LAB — CBC
HCT: 38.4 % (ref 36.0–46.0)
HEMOGLOBIN: 12.2 g/dL (ref 12.0–15.0)
MCH: 30.1 pg (ref 26.0–34.0)
MCHC: 31.8 g/dL (ref 30.0–36.0)
MCV: 94.8 fL (ref 78.0–100.0)
PLATELETS: 184 10*3/uL (ref 150–400)
RBC: 4.05 MIL/uL (ref 3.87–5.11)
RDW: 15.1 % (ref 11.5–15.5)
WBC: 4.4 10*3/uL (ref 4.0–10.5)

## 2017-03-21 LAB — HEPARIN LEVEL (UNFRACTIONATED): Heparin Unfractionated: 0.33 IU/mL (ref 0.30–0.70)

## 2017-03-21 LAB — GLUCOSE, CAPILLARY: GLUCOSE-CAPILLARY: 126 mg/dL — AB (ref 65–99)

## 2017-03-21 LAB — PROTIME-INR
INR: 2.02
PROTHROMBIN TIME: 22.7 s — AB (ref 11.4–15.2)

## 2017-03-21 MED ORDER — METOPROLOL SUCCINATE ER 25 MG PO TB24: 75.0000 mg | ORAL_TABLET | Freq: Two times a day (BID) | ORAL | 0 refills | Status: AC

## 2017-03-21 MED ORDER — CYANOCOBALAMIN 1000 MCG PO TABS: 1000.0000 ug | ORAL_TABLET | Freq: Every day | ORAL | Status: AC

## 2017-03-21 MED ORDER — WARFARIN SODIUM 5 MG PO TABS: 5.0000 mg | ORAL_TABLET | Freq: Once | ORAL | Status: DC

## 2017-03-21 MED ORDER — POTASSIUM CHLORIDE CRYS ER 20 MEQ PO TBCR: 40.0000 meq | EXTENDED_RELEASE_TABLET | Freq: Every day | ORAL | 0 refills | Status: AC

## 2017-03-21 MED ORDER — FUROSEMIDE 20 MG PO TABS: 60.0000 mg | ORAL_TABLET | Freq: Every day | ORAL | 0 refills | Status: AC

## 2017-03-21 MED ORDER — QUETIAPINE FUMARATE 25 MG PO TABS: 25.0000 mg | ORAL_TABLET | Freq: Every day | ORAL | 0 refills | Status: AC

## 2017-03-21 NOTE — Progress Notes (Signed)
Physical Therapy Treatment Patient Details Name: [name] MRN: [id number] DOB: 22-Jan-1927 Today's Date: 03/21/2017    History of Present Illness Pt is an 83 yo female with admitted with complaints of increasing dyspnea over the last week dx with congestive heart failure. Currently, preparing for TAVR. PMH for type 2 diabetes, hypertension, CHF (EF45% -   50%),  Pafib () Moderate Aortic stenosis , Mild MR, moderate pulm hypertension    PT Comments    Pt limited this session 2/2 mild agitation/poor frustration tolerance.  Per family, pt just up with OT.  Pt agreeable to sit EOB and perform LE exercises with PT, requires frequent redirection and reminders to stay attended to task.      Follow Up Recommendations  Home health PT;Supervision/Assistance - 24 hour     Equipment Recommendations  None recommended by PT    Recommendations for Other Services       Precautions / Restrictions Precautions Precautions: Fall Restrictions Weight Bearing Restrictions: No    Mobility  Bed Mobility Overal bed mobility: Needs Assistance Bed Mobility: Sit to Supine;Supine to Sit     Supine to sit: Min guard;HOB elevated Sit to supine: Min guard;HOB elevated   General bed mobility comments: increased time and verbal cues to come to EOB  Transfers Overall transfer level: Needs assistance Equipment used: Rolling walker (2 wheeled) Transfers: Sit to/from Stand Sit to Stand: Min guard          Cognition Arousal/Alertness: Awake/alert Behavior During Therapy: Agitated Overall Cognitive Status: History of cognitive impairments - at baseline                                 General Comments: pt with mild agitation this AM, easily redirected, perseverating on going home      Exercises General Exercises - Lower Extremity Ankle Circles/Pumps: AROM;Both;10 reps;Seated Long Arc Quad: AROM;Both;10 reps;Seated Hip Flexion/Marching: AROM;Both;10 reps;Seated    General  Comments General comments (skin integrity, edema, etc.): Pt's daughter present during session and encouraging her mother to participate.       Pertinent Vitals/Pain Pain Assessment: No/denies pain           PT Goals (current goals can now be found in the care plan section) Acute Rehab PT Goals Patient Stated Goal: go home PT Goal Formulation: With patient/family Time For Goal Achievement: 03/22/17 Potential to Achieve Goals: Fair Progress towards PT goals: Not progressing toward goals - comment (limited by fatigue and poor frustration tolerance today)    Frequency    Min 3X/week      PT Plan Current plan remains appropriate       AM-PAC PT "6 Clicks" Daily Activity  Outcome Measure  Difficulty turning over in bed (including adjusting bedclothes, sheets and blankets)?: A Little Difficulty moving from lying on back to sitting on the side of the bed? : A Little Difficulty sitting down on and standing up from a chair with arms (e.g., wheelchair, bedside commode, etc,.)?: A Little Help needed moving to and from a bed to chair (including a wheelchair)?: A Little Help needed walking in hospital room?: A Little Help needed climbing 3-5 steps with a railing? : A Lot 6 Click Score: 17    End of Session   Activity Tolerance: Treatment limited secondary to agitation Patient left: in bed;with call bell/phone within reach;with bed alarm set;with family/visitor present Nurse Communication: Mobility status PT Visit Diagnosis: Muscle weakness (generalized) (M62.81)  Time: 1610-9604 PT Time Calculation (min) (ACUTE ONLY): 10 min  Charges:  $Therapeutic Activity: 8-22 mins                    G Codes:          Stephania Fragmin, PT, DPT 03/21/2017, 10:33 AM

## 2017-03-21 NOTE — Progress Notes (Signed)
Discharge patient to home, daughter at bedside. PIV removed no signs of swelling noted.D/c instructions and follow up appointments discussed with patients daughter, verbalized understanding.

## 2017-03-21 NOTE — Progress Notes (Signed)
Nutrition Follow Up  DOCUMENTATION CODES:   Non-severe (moderate) malnutrition in context of chronic illness  INTERVENTION:    Continue Glucerna Shake po BID, each supplement provides 220 kcal and 10 grams of protein  NUTRITION DIAGNOSIS:   Malnutrition (Moderate) related to chronic illness (CHF, CKD) as evidenced by percent weight loss, moderate depletions of muscle mass, ongoing  GOAL:   Patient will meet greater than or equal to 90% of their needs, progressing  MONITOR:   PO intake, Supplement acceptance, Labs, Weight trends  ASSESSMENT:    83 yo female admitted with acute on chronic CHF. Pt with hx of HTN, CHF, CKD, DM  Pt working with PT at time of visit. Did not disturb. PO intake variable at 25-85% per flowsheet records. Drinking her Glucerna Shake supplements. Labs and medications reviewed. CBG's [id number].  Diet Order:  Diet heart healthy/carb modified Room service appropriate? Yes; Fluid consistency: Thin Diet - low sodium heart healthy Diet Carb Modified  Skin:  Reviewed, no issues  Last BM:  9/3  Height:   Ht Readings from Last 1 Encounters:  03/11/17 5\' 3"  (1.6 m)   Weight:   Wt Readings from Last 1 Encounters:  03/21/17 137 lb 4.8 oz (62.3 kg)   Ideal Body Weight:  52.2 kg  BMI:  Body mass index is 24.32 kg/m.  Estimated Nutritional Needs:   Kcal:  1460-1640 kcals  Protein:  73-82 g   Fluid:  >/= 1.5 L  EDUCATION NEEDS:   No education needs identified at this time  Maureen Chatters, RD, LDN Pager #: [phone] After-Hours Pager #: [phone]

## 2017-03-21 NOTE — Discharge Summary (Signed)
Physician Discharge Summary  [name] JWJ:[id number] DOB: January 05, 1927 DOA: 03/11/2017  PCP: Lorenda Ishihara, MD  Admit date: 03/11/2017 Discharge date: 03/21/2017  Admitted From:home Disposition:home  Recommendations for Outpatient Follow-up:  1. Follow up with PCP in 1-2 weeks 2. Please obtain BMP/CBC in one week  Home Health:yes Equipment/Devices:no Discharge Condition:stable CODE STATUS:full code Diet recommendation:heart healthy  Brief/Interim Summary: 83 year old female with type 2 diabetes, hypertension, congestive heart failure,  atrial fibrillation on anticoagulation, aortic stenosis, presented with dyspnea for 1 week. In the ED patient was found to have elevated BNP consistent with congestive heart failure. Patient was admitted for further evaluation.  #Acute on chronic systolic congestive heart failure: -Shortness of breath improved. On oral Lasix. Outpatient follow-up with cardiology.  #Severe aortic stenosis/ LAA thrombus: Cardiac cath was canceled  due to pt's confusion. Mental status improved and around baseline.  -CT coronary angiogram consistent with possible left atrial thrombus. Treated with IV heparin until INR is 2. Plan to resume Coumadin and monitor INR. I discussed with the patient's daughter at bedside regarding monitoring INR and Coumadin dose adjustment. -Patient has follow-up appointment with the cardiologist.  #Hypokalemia: Improved. Monitor BMP. Continue oral potassium chloride  #Nonsustained V. tach: asymptomatic. Maintain electrolytes.  #Permanent atrial fibrillation: Monitor heart rate. On Coumadin and metoprolol.  #Hypertension: Monitor blood pressure. Continue lisinopril, metoprolol and current medication.   #Diabetes with hyperglycemia: Continue home medication. Recommended to monitor blood sugar level.  #Acute metabolic encephalopathy. Currently mental status at baseline. Nonfocal neurologic exam. CTH with chronic changes. PT  OT evaluation done. Patient will be discharged home with family members and home care services. Discharge Diagnoses:  Principal Problem:   CHF (congestive heart failure) (HCC) Active Problems:   Elevated troponin   Dyslipidemia   Chronic atrial fibrillation (HCC)   Hypertension   DM (diabetes mellitus) (HCC)   Troponin level elevated   Aortic stenosis   Acute congestive heart failure (HCC)   Hypokalemia   Acute encephalopathy   Rate controlled atrial fibrillation (HCC)   Biventricular pacemaker check    Discharge Instructions  Discharge Instructions    (HEART FAILURE PATIENTS) Call MD:  Anytime you have any of the following symptoms: 1) 3 pound weight gain in 24 hours or 5 pounds in 1 week 2) shortness of breath, with or without a dry hacking cough 3) swelling in the hands, feet or stomach 4) if you have to sleep on extra pillows at night in order to breathe.    Complete by:  As directed    Call MD for:  difficulty breathing, headache or visual disturbances    Complete by:  As directed    Call MD for:  extreme fatigue    Complete by:  As directed    Call MD for:  hives    Complete by:  As directed    Call MD for:  persistant dizziness or light-headedness    Complete by:  As directed    Call MD for:  persistant nausea and vomiting    Complete by:  As directed    Call MD for:  severe uncontrolled pain    Complete by:  As directed    Call MD for:  temperature >100.4    Complete by:  As directed    Diet - low sodium heart healthy    Complete by:  As directed    Diet Carb Modified    Complete by:  As directed    Increase activity slowly    Complete  by:  As directed      Allergies as of 03/21/2017   No Known Allergies     Medication List    STOP taking these medications   digoxin 0.125 MG tablet Commonly known as:  LANOXIN   diltiazem 120 MG 24 hr capsule Commonly known as:  CARDIZEM CD     TAKE these medications   alendronate 70 MG tablet Commonly known as:   FOSAMAX Take 70 mg by mouth once a week. Take with a full glass of water on an empty stomach.   ARTIFICIAL TEAR SOLUTION OP Place 1 drop into both eyes as needed (dry eyes).   calcitRIOL 0.25 MCG capsule Commonly known as:  ROCALTROL TAKE 1 CAPSULE (0.25 MCG TOTAL) BY MOUTH DAILY.   cholecalciferol 1000 units tablet Commonly known as:  VITAMIN D Take 1,000 Units by mouth daily.   cyanocobalamin 1000 MCG tablet Take 1 tablet (1,000 mcg total) by mouth daily.   furosemide 20 MG tablet Commonly known as:  LASIX Take 3 tablets (60 mg total) by mouth daily. What changed:  medication strength  how much to take  when to take this   glimepiride 1 MG tablet Commonly known as:  AMARYL Take 1 mg by mouth daily with breakfast.   lisinopril 10 MG tablet Commonly known as:  PRINIVIL,ZESTRIL Take 0.5 tablets (5 mg total) by mouth daily.   metoprolol succinate 25 MG 24 hr tablet Commonly known as:  TOPROL-XL Take 3 tablets (75 mg total) by mouth 2 (two) times daily. Take with or immediately following a meal. What changed:  medication strength  See the new instructions.   potassium chloride SA 20 MEQ tablet Commonly known as:  K-DUR,KLOR-CON Take 2 tablets (40 mEq total) by mouth daily. What changed:  medication strength  how much to take   QUEtiapine 25 MG tablet Commonly known as:  SEROQUEL Take 1 tablet (25 mg total) by mouth at bedtime.   rosuvastatin 10 MG tablet Commonly known as:  CRESTOR Take 10 mg by mouth daily.   VITAMIN D BOOSTER PO Take 1,200 mg by mouth daily.   warfarin 4 MG tablet Commonly known as:  COUMADIN Take 2-4 mg by mouth See admin instructions. Takes 4 mg on Mon., Tues., SAT., Sun. ,Fri  Take 2 mg on Wed and thursday            Discharge Care Instructions        Start     Ordered   03/22/17 0000  furosemide (LASIX) 20 MG tablet  Daily     03/21/17 1122   03/22/17 0000  potassium chloride SA (K-DUR,KLOR-CON) 20 MEQ tablet  Daily      03/21/17 1122   03/22/17 0000  vitamin B-12 1000 MCG tablet  Daily     03/21/17 1122   03/21/17 0000  metoprolol succinate (TOPROL-XL) 25 MG 24 hr tablet  2 times daily     03/21/17 1122   03/21/17 0000  QUEtiapine (SEROQUEL) 25 MG tablet  Daily at bedtime     03/21/17 1122   03/21/17 0000  Increase activity slowly     03/21/17 1122   03/21/17 0000  Diet - low sodium heart healthy     03/21/17 1122   03/21/17 0000  Diet Carb Modified     03/21/17 1122   03/21/17 0000  Call MD for:  temperature >100.4     03/21/17 1122   03/21/17 0000  Call MD for:  persistant nausea and vomiting  03/21/17 1122   03/21/17 0000  Call MD for:  severe uncontrolled pain     03/21/17 1122   03/21/17 0000  Call MD for:  difficulty breathing, headache or visual disturbances     03/21/17 1122   03/21/17 0000  Call MD for:  hives     03/21/17 1122   03/21/17 0000  Call MD for:  persistant dizziness or light-headedness     03/21/17 1122   03/21/17 0000  Call MD for:  extreme fatigue     03/21/17 1122   03/21/17 0000  (HEART FAILURE PATIENTS) Call MD:  Anytime you have any of the following symptoms: 1) 3 pound weight gain in 24 hours or 5 pounds in 1 week 2) shortness of breath, with or without a dry hacking cough 3) swelling in the hands, feet or stomach 4) if you have to sleep on extra pillows at night in order to breathe.     03/21/17 1122     Follow-up Information    Tonny Bollman, MD. Go on 04/03/2017.   Specialty:  Cardiology Why:  @ 2:20pm.  Contact information: [address] [place] [address] [phone]        Lorenda Ishihara, MD. Schedule an appointment as soon as possible for a visit in 1 week(s).   Specialty:  Internal Medicine Contact information: [address] [place] [address] [phone]          No Known Allergies  Consultations: Cardiology  Procedures/Studies: None  Subjective: Seen and examined at bedside.  Denied headache, dizziness, nausea vomiting chest pain and shortness of breath. Daughter at bedside  Discharge Exam: Vitals:   03/20/17 2024 03/21/17 0559  BP: 100/79 106/69  Pulse: 87 86  Resp: 18 18  Temp: 97.8 F (36.6 C) 98.1 F (36.7 C)  SpO2: 96% 100%   Vitals:   03/20/17 1000 03/20/17 1212 03/20/17 2024 03/21/17 0559  BP: 114/62 (!) 115/92 100/79 106/69  Pulse: 67 84 87 86  Resp:  18 18 18   Temp:  98.9 F (37.2 C) 97.8 F (36.6 C) 98.1 F (36.7 C)  TempSrc:  Oral Oral Oral  SpO2:  100% 96% 100%  Weight:    62.3 kg (137 lb 4.8 oz)  Height:        General: Pt is alert, awake, not in acute distress Cardiovascular: RRR, Systolic murmur  Respiratory: CTA bilaterally, no wheezing, no rhonchi Abdominal: Soft, NT, ND, bowel sounds + Extremities: no edema, no cyanosis    The results of significant diagnostics from this hospitalization (including imaging, microbiology, ancillary and laboratory) are listed below for reference.     Microbiology: Recent Results (from the past 240 hour(s))  Urine Culture     Status: Abnormal   Collection Time: 03/13/17  5:10 AM  Result Value Ref Range Status   Specimen Description URINE, RANDOM  Final   Special Requests NONE  Final   Culture MULTIPLE SPECIES PRESENT, SUGGEST RECOLLECTION (A)  Final   Report Status 03/14/2017 FINAL  Final     Labs: BNP (last 3 results)  Recent Labs  03/11/17 1616  BNP 1,286.5*   Basic Metabolic Panel:  Recent Labs Lab 03/15/17 0628 03/16/17 0358 03/17/17 0445 03/18/17 0642  NA 145 140 141 140  K 3.4* 3.8 3.5 4.1  CL 109 109 109 108  CO2 26 26 26 25   GLUCOSE 145* 142* 164* 127*  BUN 14 20 20 19   CREATININE 1.07* 1.13* 1.27* 1.14*  CALCIUM 10.2 10.3 10.3 10.6*  MG 2.2  --   --   --    Liver Function Tests: No results for input(s): AST, ALT, ALKPHOS, BILITOT, PROT, ALBUMIN in the last 168 hours. No results for input(s): LIPASE, AMYLASE in the last 168 hours. No results for input(s):  AMMONIA in the last 168 hours. CBC:  Recent Labs Lab 03/15/17 0628 03/17/17 0445 03/18/17 0642 03/20/17 0343 03/21/17 0619  WBC 5.4 5.7 5.5 5.1 4.4  HGB 12.9 12.8 12.8 12.1 12.2  HCT 41.5 41.3 39.7 38.8 38.4  MCV 95.4 94.7 93.6 94.6 94.8  PLT 200 193 196 192 184   Cardiac Enzymes: No results for input(s): CKTOTAL, CKMB, CKMBINDEX, TROPONINI in the last 168 hours. BNP: Invalid input(s): POCBNP CBG:  Recent Labs Lab 03/20/17 0729 03/20/17 1120 03/20/17 1607 03/20/17 2116 03/21/17 0801  GLUCAP 105* 149* 236* 243* 126*   D-Dimer No results for input(s): DDIMER in the last 72 hours. Hgb A1c No results for input(s): HGBA1C in the last 72 hours. Lipid Profile No results for input(s): CHOL, HDL, LDLCALC, TRIG, CHOLHDL, LDLDIRECT in the last 72 hours. Thyroid function studies No results for input(s): TSH, T4TOTAL, T3FREE, THYROIDAB in the last 72 hours.  Invalid input(s): FREET3 Anemia work up No results for input(s): VITAMINB12, FOLATE, FERRITIN, TIBC, IRON, RETICCTPCT in the last 72 hours. Urinalysis    Component Value Date/Time   COLORURINE YELLOW 03/13/2017 0510   APPEARANCEUR CLEAR 03/13/2017 0510   LABSPEC 1.008 03/13/2017 0510   PHURINE 7.0 03/13/2017 0510   GLUCOSEU NEGATIVE 03/13/2017 0510   HGBUR NEGATIVE 03/13/2017 0510   BILIRUBINUR NEGATIVE 03/13/2017 0510   KETONESUR NEGATIVE 03/13/2017 0510   PROTEINUR 30 (A) 03/13/2017 0510   UROBILINOGEN 0.2 09/27/2014 1157   NITRITE NEGATIVE 03/13/2017 0510   LEUKOCYTESUR NEGATIVE 03/13/2017 0510   Sepsis Labs Invalid input(s): PROCALCITONIN,  WBC,  LACTICIDVEN Microbiology Recent Results (from the past 240 hour(s))  Urine Culture     Status: Abnormal   Collection Time: 03/13/17  5:10 AM  Result Value Ref Range Status   Specimen Description URINE, RANDOM  Final   Special Requests NONE  Final   Culture MULTIPLE SPECIES PRESENT, SUGGEST RECOLLECTION (A)  Final   Report Status 03/14/2017 FINAL  Final      Time coordinating discharge: 28 minutes  SIGNED:   Maxie Barb, MD  [facility] 03/21/2017, 11:25 AM  If 7PM-7AM, please contact night-coverage [email or web address] Password [email or web address]

## 2017-03-21 NOTE — Progress Notes (Signed)
Occupational Therapy Treatment Patient Details Name: [name] MRN: [id number] DOB: [birth year] Today's Date: 03/21/2017    History of present illness Pt is an 83 yo female with admitted with complaints of increasing dyspnea over the last week dx with congestive heart failure. Currently, preparing for TAVR. PMH for type 2 diabetes, hypertension, CHF (EF45% -   50%),  Pafib () Moderate Aortic stenosis , Mild MR, moderate pulm hypertension   OT comments  Pt progressing toward OT goals. She continues to be limited by fatigue during ADL tasks but was able to stand for face washing task with min assist and complete LB dressing with min assist and increased time. She was easily distracted during session requiring cues and assistance to continue with tasks. Educated pt and daughter on activity progression post-acute D/C. OT will continue to follow while admitted.    Follow Up Recommendations  Home health OT;Supervision/Assistance - 24 hour ([place] aide)    Equipment Recommendations  None recommended by OT (Has needs met)    Recommendations for Other Services      Precautions / Restrictions Precautions Precautions: Fall Restrictions Weight Bearing Restrictions: No       Mobility Bed Mobility Overal bed mobility: Needs Assistance Bed Mobility: Sit to Supine     Supine to sit: Min assist Sit to supine: Min assist   General bed mobility comments: Min assist for B LE and trunk assist.   Transfers Overall transfer level: Needs assistance Equipment used: Rolling walker (2 wheeled) Transfers: Sit to/from Stand Sit to Stand: Min guard         General transfer comment: MIn guard assist for steadying.     Balance Overall balance assessment: Needs assistance Sitting-balance support: Feet supported Sitting balance-Leahy Scale: Good     Standing balance support: During functional activity;Single extremity supported Standing balance-Leahy Scale: Poor Standing balance comment: Unable  to stand without single UE support. Cues for upright positioning in standing and leaning over sink frequently.                           ADL either performed or assessed with clinical judgement   ADL Overall ADL's : Needs assistance/impaired     Grooming: Minimal assistance;Wash/dry face;Standing Grooming Details (indicate cue type and reason): Decreased activity tolerance for ADL and only able to tolerate standing for washing face before beoming very fatigued. Min assist to sequence task.             Lower Body Dressing: Minimal assistance;Sit to/from stand Lower Body Dressing Details (indicate cue type and reason): Donning socks and shoes. Pt would start to put her shoe on and then become distracted and not complete task.  Toilet Transfer: Min guard;RW;Ambulation           Functional mobility during ADLs: Min guard;Rolling walker General ADL Comments: Pt easily fatigued this session. Educated on activity progression with assistance.      Vision   Vision Assessment?: No apparent visual deficits   Perception     Praxis      Cognition Arousal/Alertness: Awake/alert Behavior During Therapy: WFL for tasks assessed/performed Overall Cognitive Status: History of cognitive impairments - at baseline                                 General Comments: Pt with decreased recall and awareness. Easily distracted and requiring assistance for problem solving and attending to  tasks.         Exercises     Shoulder Instructions       General Comments Pt's daughter present during session and encouraging her mother to participate.     Pertinent Vitals/ Pain       Pain Assessment: No/denies pain  Home Living                                          Prior Functioning/Environment              Frequency  Min 2X/week        Progress Toward Goals  OT Goals(current goals can now be found in the care plan section)  Progress  towards OT goals: Progressing toward goals  Acute Rehab OT Goals Patient Stated Goal: go home OT Goal Formulation: With patient/family Time For Goal Achievement: 03/30/17 Potential to Achieve Goals: [place] Discharge plan remains appropriate    Co-evaluation                 AM-PAC PT "6 Clicks" Daily Activity     Outcome Measure   Help from another person eating meals?: None Help from another person taking care of personal grooming?: A Little Help from another person toileting, which includes using toliet, bedpan, or urinal?: A Lot Help from another person bathing (including washing, rinsing, drying)?: A Lot Help from another person to put on and taking off regular upper body clothing?: A Little Help from another person to put on and taking off regular lower body clothing?: A Little 6 Click Score: 17    End of Session Equipment Utilized During Treatment: Gait belt;Rolling walker  OT Visit Diagnosis: Unsteadiness on feet (R26.81);Muscle weakness (generalized) (M62.81)   Activity Tolerance Patient limited by fatigue   Patient Left in bed;with family/visitor present   Nurse Communication Mobility status        Time: 0940-1000 OT Time Calculation (min): 20 min  Charges: OT General Charges $OT Visit: 1 Visit OT Treatments $Self Care/Home Management : 8-22 mins  Norman Herrlich, MS OTR/L  Pager: [phone] 03/21/2017, 10:33 AM

## 2017-03-21 NOTE — Progress Notes (Signed)
ANTICOAGULATION CONSULT NOTE - Follow Up Consult  Pharmacy Consult for Heparin and Coumadin Indication: atrial fibrillation and possible LV thrombus  No Known Allergies  Patient Measurements: Height: 5\' 3"  (160 cm) Weight: 137 lb 4.8 oz (62.3 kg) (a scale) IBW/kg (Calculated) : 52.4 Heparin Dosing Weight: 60.4 kg  Vital Signs: Temp: 98.1 F (36.7 C) (09/04 0559) Temp Source: Oral (09/04 0559) BP: 106/69 (09/04 0559) Pulse Rate: 86 (09/04 0559)  Labs:  Recent Labs  03/19/17 0524 03/20/17 0343 03/21/17 0619  HGB  --  12.1 12.2  HCT  --  38.8 38.4  PLT  --  192 184  LABPROT 19.4* 21.3* 22.7*  INR 1.65 1.87 2.02  HEPARINUNFRC 0.42 0.33 0.33   Estimated Creatinine Clearance: 28.2 mL/min (A) (by C-G formula based on SCr of 1.14 mg/dL (H)).  Assessment: 83 yr old female on Coumadin prior to admission for afib.  Coumadin held for cath, which had to be cancelled on 8/28 due to confusion. Heparin bridge added 8/30 for possible LV thrombus per CT of chest. Heparin infusion discontinued this morning as INR > 2.0.  -INR= 2.02 -CBC stable and no bleeding reported  Home Coumadin regimen: 4 mg daily except 2 mg on Wed and Thurs. INR 1.76 on admit 8/25.  Goal of Therapy:  INR 2-3 Heparin level 0.3-0.7 units/ml Monitor platelets by anticoagulation protocol: Yes   Plan:  D/C heparin infusion Warfarin 5mg  x1 tonight Daily  INR and CBC Monitor for s/sx of bleeding  Ruben Im, PharmD Clinical Pharmacist 03/21/2017 10:01 AM

## 2017-03-21 NOTE — Progress Notes (Signed)
Patient is arranged for the [facility] First program with [facility] for [facility] services. Dareen Piano with [facility] made aware of discharge home today. Abelino Derrick [facility] [phone]

## 2017-03-22 DIAGNOSIS — I110 Hypertensive heart disease with heart failure: Secondary | ICD-10-CM | POA: Diagnosis not present

## 2017-03-22 DIAGNOSIS — I5023 Acute on chronic systolic (congestive) heart failure: Secondary | ICD-10-CM | POA: Diagnosis not present

## 2017-03-23 ENCOUNTER — Inpatient Hospital Stay (HOSPITAL_COMMUNITY): Payer: Medicare Other

## 2017-03-23 ENCOUNTER — Inpatient Hospital Stay (HOSPITAL_COMMUNITY)
Admission: EM | Admit: 2017-03-23 | Discharge: 2017-04-17 | DRG: 064 | Disposition: E | Payer: Medicare Other | Attending: Internal Medicine | Admitting: Internal Medicine

## 2017-03-23 ENCOUNTER — Encounter (HOSPITAL_COMMUNITY): Payer: Self-pay | Admitting: Emergency Medicine

## 2017-03-23 ENCOUNTER — Emergency Department (HOSPITAL_COMMUNITY): Payer: Medicare Other

## 2017-03-23 DIAGNOSIS — J9600 Acute respiratory failure, unspecified whether with hypoxia or hypercapnia: Secondary | ICD-10-CM

## 2017-03-23 DIAGNOSIS — R4182 Altered mental status, unspecified: Secondary | ICD-10-CM | POA: Diagnosis not present

## 2017-03-23 DIAGNOSIS — G8194 Hemiplegia, unspecified affecting left nondominant side: Secondary | ICD-10-CM | POA: Diagnosis present

## 2017-03-23 DIAGNOSIS — Z87891 Personal history of nicotine dependence: Secondary | ICD-10-CM | POA: Diagnosis not present

## 2017-03-23 DIAGNOSIS — E785 Hyperlipidemia, unspecified: Secondary | ICD-10-CM | POA: Diagnosis present

## 2017-03-23 DIAGNOSIS — Z66 Do not resuscitate: Secondary | ICD-10-CM | POA: Diagnosis present

## 2017-03-23 DIAGNOSIS — N183 Chronic kidney disease, stage 3 (moderate): Secondary | ICD-10-CM | POA: Diagnosis present

## 2017-03-23 DIAGNOSIS — I63531 Cerebral infarction due to unspecified occlusion or stenosis of right posterior cerebral artery: Secondary | ICD-10-CM | POA: Diagnosis not present

## 2017-03-23 DIAGNOSIS — Z515 Encounter for palliative care: Secondary | ICD-10-CM | POA: Diagnosis present

## 2017-03-23 DIAGNOSIS — Y95 Nosocomial condition: Secondary | ICD-10-CM | POA: Diagnosis present

## 2017-03-23 DIAGNOSIS — E1122 Type 2 diabetes mellitus with diabetic chronic kidney disease: Secondary | ICD-10-CM | POA: Diagnosis present

## 2017-03-23 DIAGNOSIS — I482 Chronic atrial fibrillation: Secondary | ICD-10-CM | POA: Diagnosis present

## 2017-03-23 DIAGNOSIS — G935 Compression of brain: Secondary | ICD-10-CM | POA: Diagnosis present

## 2017-03-23 DIAGNOSIS — I350 Nonrheumatic aortic (valve) stenosis: Secondary | ICD-10-CM | POA: Diagnosis present

## 2017-03-23 DIAGNOSIS — I959 Hypotension, unspecified: Secondary | ICD-10-CM | POA: Diagnosis present

## 2017-03-23 DIAGNOSIS — Z7984 Long term (current) use of oral hypoglycemic drugs: Secondary | ICD-10-CM | POA: Diagnosis not present

## 2017-03-23 DIAGNOSIS — R402431 Glasgow coma scale score 3-8, in the field [EMT or ambulance]: Secondary | ICD-10-CM | POA: Diagnosis not present

## 2017-03-23 DIAGNOSIS — Z7901 Long term (current) use of anticoagulants: Secondary | ICD-10-CM | POA: Diagnosis not present

## 2017-03-23 DIAGNOSIS — Z950 Presence of cardiac pacemaker: Secondary | ICD-10-CM | POA: Diagnosis not present

## 2017-03-23 DIAGNOSIS — J9601 Acute respiratory failure with hypoxia: Secondary | ICD-10-CM | POA: Diagnosis present

## 2017-03-23 DIAGNOSIS — N179 Acute kidney failure, unspecified: Secondary | ICD-10-CM | POA: Diagnosis present

## 2017-03-23 DIAGNOSIS — Z8249 Family history of ischemic heart disease and other diseases of the circulatory system: Secondary | ICD-10-CM

## 2017-03-23 DIAGNOSIS — G9340 Encephalopathy, unspecified: Secondary | ICD-10-CM | POA: Diagnosis present

## 2017-03-23 DIAGNOSIS — Z90710 Acquired absence of both cervix and uterus: Secondary | ICD-10-CM

## 2017-03-23 DIAGNOSIS — G936 Cerebral edema: Secondary | ICD-10-CM | POA: Diagnosis present

## 2017-03-23 DIAGNOSIS — I130 Hypertensive heart and chronic kidney disease with heart failure and stage 1 through stage 4 chronic kidney disease, or unspecified chronic kidney disease: Secondary | ICD-10-CM | POA: Diagnosis present

## 2017-03-23 DIAGNOSIS — I5023 Acute on chronic systolic (congestive) heart failure: Secondary | ICD-10-CM | POA: Diagnosis present

## 2017-03-23 DIAGNOSIS — I4891 Unspecified atrial fibrillation: Secondary | ICD-10-CM | POA: Diagnosis not present

## 2017-03-23 DIAGNOSIS — I639 Cerebral infarction, unspecified: Secondary | ICD-10-CM | POA: Diagnosis not present

## 2017-03-23 DIAGNOSIS — J189 Pneumonia, unspecified organism: Secondary | ICD-10-CM | POA: Diagnosis not present

## 2017-03-23 DIAGNOSIS — J9690 Respiratory failure, unspecified, unspecified whether with hypoxia or hypercapnia: Secondary | ICD-10-CM | POA: Diagnosis not present

## 2017-03-23 DIAGNOSIS — R000 Tachycardia, unspecified: Secondary | ICD-10-CM | POA: Diagnosis not present

## 2017-03-23 LAB — I-STAT ARTERIAL BLOOD GAS, ED
ACID-BASE EXCESS: 4 mmol/L — AB (ref 0.0–2.0)
BICARBONATE: 28.4 mmol/L — AB (ref 20.0–28.0)
O2 Saturation: 100 %
PCO2 ART: 40.9 mmHg (ref 32.0–48.0)
PH ART: 7.446 (ref 7.350–7.450)
PO2 ART: 384 mmHg — AB (ref 83.0–108.0)
Patient temperature: 97
TCO2: 30 mmol/L (ref 22–32)

## 2017-03-23 LAB — COMPREHENSIVE METABOLIC PANEL
ALBUMIN: 3.5 g/dL (ref 3.5–5.0)
ALT: 28 U/L (ref 14–54)
ANION GAP: 10 (ref 5–15)
AST: 33 U/L (ref 15–41)
Alkaline Phosphatase: 62 U/L (ref 38–126)
BILIRUBIN TOTAL: 0.5 mg/dL (ref 0.3–1.2)
BUN: 23 mg/dL — ABNORMAL HIGH (ref 6–20)
CHLORIDE: 105 mmol/L (ref 101–111)
CO2: 24 mmol/L (ref 22–32)
Calcium: 11.1 mg/dL — ABNORMAL HIGH (ref 8.9–10.3)
Creatinine, Ser: 1.15 mg/dL — ABNORMAL HIGH (ref 0.44–1.00)
GFR calc Af Amer: 48 mL/min — ABNORMAL LOW (ref >60)
GFR calc non Af Amer: 41 mL/min — ABNORMAL LOW (ref >60)
GLUCOSE: 187 mg/dL — AB (ref 65–99)
POTASSIUM: 4.8 mmol/L (ref 3.5–5.1)
Sodium: 139 mmol/L (ref 135–145)
TOTAL PROTEIN: 6.8 g/dL (ref 6.5–8.1)

## 2017-03-23 LAB — URINALYSIS, ROUTINE W REFLEX MICROSCOPIC
BILIRUBIN URINE: NEGATIVE
Glucose, UA: NEGATIVE mg/dL
HGB URINE DIPSTICK: NEGATIVE
KETONES UR: NEGATIVE mg/dL
LEUKOCYTES UA: NEGATIVE
Nitrite: NEGATIVE
PROTEIN: 30 mg/dL — AB
Specific Gravity, Urine: 1.015 (ref 1.005–1.030)
pH: 5 (ref 5.0–8.0)

## 2017-03-23 LAB — I-STAT CHEM 8, ED
BUN: 29 mg/dL — AB (ref 6–20)
CALCIUM ION: 1.47 mmol/L — AB (ref 1.15–1.40)
CREATININE: 1 mg/dL (ref 0.44–1.00)
Chloride: 104 mmol/L (ref 101–111)
Glucose, Bld: 181 mg/dL — ABNORMAL HIGH (ref 65–99)
HEMATOCRIT: 47 % — AB (ref 36.0–46.0)
Hemoglobin: 16 g/dL — ABNORMAL HIGH (ref 12.0–15.0)
Potassium: 4.7 mmol/L (ref 3.5–5.1)
Sodium: 142 mmol/L (ref 135–145)
TCO2: 27 mmol/L (ref 22–32)

## 2017-03-23 LAB — BASIC METABOLIC PANEL
Anion gap: 11 (ref 5–15)
BUN: 20 mg/dL (ref 6–20)
CALCIUM: 10.7 mg/dL — AB (ref 8.9–10.3)
CO2: 24 mmol/L (ref 22–32)
CREATININE: 1.31 mg/dL — AB (ref 0.44–1.00)
Chloride: 106 mmol/L (ref 101–111)
GFR calc Af Amer: 41 mL/min — ABNORMAL LOW (ref >60)
GFR calc non Af Amer: 35 mL/min — ABNORMAL LOW (ref >60)
GLUCOSE: 239 mg/dL — AB (ref 65–99)
Potassium: 4.9 mmol/L (ref 3.5–5.1)
Sodium: 141 mmol/L (ref 135–145)

## 2017-03-23 LAB — I-STAT TROPONIN, ED: TROPONIN I, POC: 0.06 ng/mL (ref 0.00–0.08)

## 2017-03-23 LAB — CBC WITH DIFFERENTIAL/PLATELET
BASOS ABS: 0 10*3/uL (ref 0.0–0.1)
Basophils Relative: 1 %
Eosinophils Absolute: 0.1 10*3/uL (ref 0.0–0.7)
Eosinophils Relative: 1 %
HCT: 42.1 % (ref 36.0–46.0)
Hemoglobin: 12.9 g/dL (ref 12.0–15.0)
LYMPHS ABS: 1.5 10*3/uL (ref 0.7–4.0)
LYMPHS PCT: 36 %
MCH: 29.1 pg (ref 26.0–34.0)
MCHC: 30.6 g/dL (ref 30.0–36.0)
MCV: 94.8 fL (ref 78.0–100.0)
Monocytes Absolute: 0.3 10*3/uL (ref 0.1–1.0)
Monocytes Relative: 6 %
NEUTROS ABS: 2.4 10*3/uL (ref 1.7–7.7)
Neutrophils Relative %: 56 %
Platelets: 208 10*3/uL (ref 150–400)
RBC: 4.44 MIL/uL (ref 3.87–5.11)
RDW: 15.1 % (ref 11.5–15.5)
WBC: 4.2 10*3/uL (ref 4.0–10.5)

## 2017-03-23 LAB — GLUCOSE, CAPILLARY
Glucose-Capillary: 192 mg/dL — ABNORMAL HIGH (ref 65–99)
Glucose-Capillary: 246 mg/dL — ABNORMAL HIGH (ref 65–99)

## 2017-03-23 LAB — TRIGLYCERIDES: Triglycerides: 102 mg/dL (ref <150)

## 2017-03-23 LAB — BRAIN NATRIURETIC PEPTIDE: B Natriuretic Peptide: 848.1 pg/mL — ABNORMAL HIGH (ref 0.0–100.0)

## 2017-03-23 LAB — PROTIME-INR
INR: 2.01
Prothrombin Time: 22.6 seconds — ABNORMAL HIGH (ref 11.4–15.2)

## 2017-03-23 LAB — CBG MONITORING, ED
GLUCOSE-CAPILLARY: 167 mg/dL — AB (ref 65–99)
GLUCOSE-CAPILLARY: 229 mg/dL — AB (ref 65–99)

## 2017-03-23 LAB — MAGNESIUM
Magnesium: 2.3 mg/dL (ref 1.7–2.4)
Magnesium: 2.2 mg/dL (ref 1.7–2.4)

## 2017-03-23 LAB — MRSA PCR SCREENING: MRSA BY PCR: NEGATIVE

## 2017-03-23 LAB — APTT: APTT: 45 s — AB (ref 24–36)

## 2017-03-23 LAB — PHOSPHORUS
PHOSPHORUS: 3.2 mg/dL (ref 2.5–4.6)
Phosphorus: 3.4 mg/dL (ref 2.5–4.6)

## 2017-03-23 LAB — I-STAT CG4 LACTIC ACID, ED: LACTIC ACID, VENOUS: 1.41 mmol/L (ref 0.5–1.9)

## 2017-03-23 MED ORDER — DEXTROSE 5 % IV SOLN
1.0000 g | Freq: Two times a day (BID) | INTRAVENOUS | Status: DC
  Administered 2017-03-23 – 2017-03-24 (×2): 1 g via INTRAVENOUS
  Filled 2017-03-23 (×2): qty 1

## 2017-03-23 MED ORDER — HEPARIN (PORCINE) IN NACL 100-0.45 UNIT/ML-% IJ SOLN
750.0000 [IU]/h | INTRAMUSCULAR | Status: DC
  Administered 2017-03-23: 750 [IU]/h via INTRAVENOUS
  Filled 2017-03-23: qty 250

## 2017-03-23 MED ORDER — PANTOPRAZOLE SODIUM 40 MG IV SOLR
40.0000 mg | INTRAVENOUS | Status: DC
  Administered 2017-03-24: 40 mg via INTRAVENOUS
  Filled 2017-03-23: qty 40

## 2017-03-23 MED ORDER — ETOMIDATE 2 MG/ML IV SOLN
INTRAVENOUS | Status: AC | PRN
  Administered 2017-03-23: 20 mg via INTRAVENOUS

## 2017-03-23 MED ORDER — PROPOFOL 1000 MG/100ML IV EMUL
0.0000 ug/kg/min | INTRAVENOUS | Status: DC
Start: 2017-03-23 — End: 2017-03-23
  Administered 2017-03-23: 10 ug/kg/min via INTRAVENOUS

## 2017-03-23 MED ORDER — FUROSEMIDE 10 MG/ML IJ SOLN
40.0000 mg | Freq: Two times a day (BID) | INTRAMUSCULAR | Status: AC
  Administered 2017-03-23 – 2017-03-24 (×2): 40 mg via INTRAVENOUS
  Filled 2017-03-23 (×2): qty 4

## 2017-03-23 MED ORDER — SODIUM CHLORIDE 0.9 % IV BOLUS (SEPSIS)
30.0000 mL/kg | Freq: Once | INTRAVENOUS | Status: AC
  Administered 2017-03-23: 1869 mL via INTRAVENOUS

## 2017-03-23 MED ORDER — PRO-STAT SUGAR FREE PO LIQD
30.0000 mL | Freq: Two times a day (BID) | ORAL | Status: DC
  Filled 2017-03-23 (×3): qty 30

## 2017-03-23 MED ORDER — SODIUM CHLORIDE 0.9 % IV SOLN
0.0000 ug/min | INTRAVENOUS | Status: DC
  Administered 2017-03-23: 20 ug/min via INTRAVENOUS
  Filled 2017-03-23 (×2): qty 1

## 2017-03-23 MED ORDER — ALBUTEROL SULFATE (2.5 MG/3ML) 0.083% IN NEBU: 2.5000 mg | INHALATION_SOLUTION | RESPIRATORY_TRACT | Status: DC | PRN

## 2017-03-23 MED ORDER — INSULIN ASPART 100 UNIT/ML ~~LOC~~ SOLN
0.0000 [IU] | SUBCUTANEOUS | Status: DC
  Administered 2017-03-23: 3 [IU] via SUBCUTANEOUS
  Administered 2017-03-24 (×2): 5 [IU] via SUBCUTANEOUS

## 2017-03-23 MED ORDER — ACETAMINOPHEN 160 MG/5ML PO SOLN
650.0000 mg | Freq: Four times a day (QID) | ORAL | Status: DC | PRN
  Administered 2017-03-23: 650 mg
  Filled 2017-03-23: qty 20.3

## 2017-03-23 MED ORDER — FENTANYL 2500MCG IN NS 250ML (10MCG/ML) PREMIX INFUSION
0.0000 ug/h | INTRAVENOUS | Status: DC
Start: 2017-03-23 — End: 2017-03-24
  Administered 2017-03-23: 25 ug/h via INTRAVENOUS
  Filled 2017-03-23: qty 250

## 2017-03-23 MED ORDER — DEXTROSE 5 % IV SOLN
2.0000 g | Freq: Once | INTRAVENOUS | Status: AC
  Administered 2017-03-23: 2 g via INTRAVENOUS
  Filled 2017-03-23: qty 2

## 2017-03-23 MED ORDER — ROCURONIUM BROMIDE 50 MG/5ML IV SOLN
INTRAVENOUS | Status: AC | PRN
  Administered 2017-03-23: 70 mg via INTRAVENOUS

## 2017-03-23 MED ORDER — VANCOMYCIN HCL IN DEXTROSE 1-5 GM/200ML-% IV SOLN
1000.0000 mg | Freq: Once | INTRAVENOUS | Status: DC
  Administered 2017-03-23: 1000 mg via INTRAVENOUS
  Filled 2017-03-23: qty 200

## 2017-03-23 MED ORDER — VITAL HIGH PROTEIN PO LIQD
1000.0000 mL | ORAL | Status: DC
  Administered 2017-03-24: 1000 mL

## 2017-03-23 MED ORDER — SODIUM CHLORIDE 0.9 % IV SOLN: 250.0000 mL | INTRAVENOUS | Status: DC | PRN

## 2017-03-23 NOTE — ED Notes (Signed)
Attempted to call report

## 2017-03-23 NOTE — Progress Notes (Addendum)
ANTICOAGULATION CONSULT NOTE - Follow-Up Consult  Pharmacy Consult for heparin Indication: atrial fibrillation  No Known Allergies  Patient Measurements: Height: 5\' 4"  (162.6 cm) IBW/kg (Calculated) : 54.7 Heparin Dosing Weight: 62.3kg  Vital Signs: Temp: 97.3 F (36.3 C) (09/06 1017) Temp Source: Core (Comment) (09/06 1017) BP: 133/85 (09/06 1015) Pulse Rate: 85 (09/06 1015)  Labs:  Recent Labs  03/21/17 0619 2017-04-20 0937 Apr 20, 2017 1000  HGB 12.2 12.9 16.0*  HCT 38.4 42.1 47.0*  PLT 184 208  --   APTT  --  45*  --   LABPROT 22.7* 22.6*  --   INR 2.02 2.01  --   HEPARINUNFRC 0.33  --   --   CREATININE  --  1.15* 1.00    Estimated Creatinine Clearance: 33.6 mL/min (by C-G formula based on SCr of 1 mg/dL).   Medical History: Past Medical History:  Diagnosis Date  . Aortic stenosis, severe    Moderate to severe August 2015   . Atrial fibrillation, chronic (HCC)    a. on Coumadin  . Chronic systolic CHF (congestive heart failure) (HCC)   . Dyslipidemia   . Essential hypertension   . Incontinent of urine   . Osteoarthritis   . Thyroid goiter   . Type 2 diabetes mellitus (HCC)     Assessment: 46 yof presented to the ED in respiratory distress and with AMS. She is no chronic coumadin for history of afib. INR was 2.1 this AM. Per CCM, okay to start heparin now. CBC is WNL and no bleeding noted.   Goal of Therapy:  INR 2-3 Monitor platelets by anticoagulation protocol: Yes   Plan:  -Start IV heparin at 750 units/hr  -F/u AM HL and INR. Ensure INR did not drift up further  -Monitor daily HL, CBC and s/s of bleeding   Vinnie Level, PharmD., BCPS Clinical Pharmacist Pager [phone]

## 2017-03-23 NOTE — ED Notes (Signed)
Family at bedside, updated on plan of care.

## 2017-03-23 NOTE — Progress Notes (Signed)
Pharmacy Antibiotic Note  RULA VRANICH is a 83 y.o. female admitted on 03/20/2017 with pneumonia.  Pharmacy has been consulted for ceftazidime dosing. Pt is hypothermic and WBC is WNL. SCr is 1.15 and lactic acid is <2.   Plan: Ceftazidime 1gm IV Q12H F/u renal fxn, C&S, clinical status  Height: 5\' 4"  (162.6 cm) IBW/kg (Calculated) : 54.7  Temp (24hrs), Avg:97.1 F (36.2 C), Min:96.8 F (36 C), Max:97.3 F (36.3 C)   Recent Labs Lab 03/17/17 0445 03/18/17 0642 03/20/17 0343 03/21/17 0619 03/22/2017 0937 03/27/2017 1000  WBC 5.7 5.5 5.1 4.4 4.2  --   CREATININE 1.27* 1.14*  --   --  1.15* 1.00  LATICACIDVEN  --   --   --   --   --  1.41    Estimated Creatinine Clearance: 33.6 mL/min (by C-G formula based on SCr of 1 mg/dL).    No Known Allergies  Antimicrobials this admission: Ceftaz 9/6>> Vanc x 1 9/6 Cefepime x 1 9/6  Dose adjustments this admission: N/A  Microbiology results: Pending  Thank you for allowing pharmacy to be a part of this patient's care.  Dynver Clemson, Drake Leach 04/04/2017 11:49 AM

## 2017-03-23 NOTE — ED Triage Notes (Signed)
Pt here from home unresponsive , pupils pinpoint lsn 10 pm last night , pt arrived to the ED being bagged by ems

## 2017-03-23 NOTE — ED Provider Notes (Signed)
[facility]-EMERGENCY DEPT Provider Note   CSN: [id number] Arrival date & time: April 10, 2017  7829   History   Chief Complaint Chief Complaint  Patient presents with  . Loss of Consciousness    HPI [name] is a 83 y.o. female presenting to [facility] via EMS unresponsive. Last known normal per family was 10 pm last night. At baseline she is AOx4, ambulates on her own.   Level 5 caveat due to AMS.  HPI  Past Medical History:  Diagnosis Date  . Aortic stenosis, severe    Moderate to severe August 2015   . Atrial fibrillation, chronic (HCC)    a. on Coumadin  . Chronic systolic CHF (congestive heart failure) (HCC)   . Dyslipidemia   . Essential hypertension   . Incontinent of urine   . Osteoarthritis   . Thyroid goiter   . Type 2 diabetes mellitus [facility])     Patient Active Problem List   Diagnosis Date Noted  . Respiratory failure (HCC) 2017-04-10  . Biventricular pacemaker check   . Rate controlled atrial fibrillation (HCC)   . Acute encephalopathy 03/12/2017  . Hypokalemia 03/11/2017  . Acute congestive heart failure (HCC)   . Atrial fibrillation with RVR (HCC) 11/12/2015  . Aortic stenosis 09/29/2014  . Elevated troponin 09/27/2014  . Confusion 09/27/2014  . CHF (congestive heart failure) (HCC) 09/27/2014  . Dyslipidemia 09/27/2014  . Chronic atrial fibrillation (HCC) 09/27/2014  . Hypertension 09/27/2014  . DM (diabetes mellitus) (HCC) 09/27/2014  . Troponin level elevated     Past Surgical History:  Procedure Laterality Date  . ABDOMINAL HYSTERECTOMY    . CARDIAC CATHETERIZATION    . PACEMAKER INSERTION     St Jude CRT-P ([place])    OB History    No data available      Home Medications    Prior to Admission medications   Medication Sig Start Date End Date Taking? Authorizing Provider  alendronate (FOSAMAX) 70 MG tablet Take 70 mg by mouth once a week. Take with a full glass of water on an empty stomach.   Yes [provider]  ARTIFICIAL TEAR  SOLUTION OP Place 1 drop into both eyes as needed (dry eyes).   Yes [provider]  calcitRIOL (ROCALTROL) 0.25 MCG capsule TAKE 1 CAPSULE (0.25 MCG TOTAL) BY MOUTH DAILY. 12/30/15  Yes Duke Salvia, MD  cholecalciferol (VITAMIN D) 1000 units tablet Take 1,000 Units by mouth daily.   Yes [provider]  furosemide (LASIX) 20 MG tablet Take 3 tablets (60 mg total) by mouth daily. 03/22/17  Yes Maxie Barb, MD  glimepiride (AMARYL) 1 MG tablet Take 1 mg by mouth daily with breakfast.    Yes [provider]  lisinopril (PRINIVIL,ZESTRIL) 10 MG tablet Take 0.5 tablets (5 mg total) by mouth daily. 03/04/16  Yes Duke Salvia, MD  metoprolol succinate (TOPROL-XL) 25 MG 24 hr tablet Take 3 tablets (75 mg total) by mouth 2 (two) times daily. Take with or immediately following a meal. 03/21/17  Yes Maxie Barb, MD  Nutritional Supplements (VITAMIN D BOOSTER PO) Take 1,200 mg by mouth daily.   Yes [provider]  potassium chloride SA (K-DUR,KLOR-CON) 20 MEQ tablet Take 2 tablets (40 mEq total) by mouth daily. 03/22/17  Yes Maxie Barb, MD  QUEtiapine (SEROQUEL) 25 MG tablet Take 1 tablet (25 mg total) by mouth at bedtime. 03/21/17  Yes Maxie Barb, MD  rosuvastatin (CRESTOR) 10 MG tablet Take 10  mg by mouth daily.   Yes [provider]  vitamin B-12 1000 MCG tablet Take 1 tablet (1,000 mcg total) by mouth daily. 03/22/17  Yes Maxie Barb, MD  warfarin (COUMADIN) 4 MG tablet Take 2-4 mg by mouth See admin instructions. Takes 4 mg on Mon., Tues., SAT., Sun. ,Fri  Take 2 mg on Wed and thursday   Yes [provider]    Family History Family History  Problem Relation Age of Onset  . Hypertension Brother     Social History Social History  Substance Use Topics  . Smoking status: Never Smoker  . Smokeless tobacco: Former Neurosurgeon    Types: Snuff    Quit date: 07/18/2000  . Alcohol use No     Allergies     Patient has no known allergies.   Review of Systems Review of Systems Unable to obtain 2/2 AMS  Physical Exam Updated Vital Signs BP 133/85   Pulse 85   Temp (!) 97.3 F (36.3 C) (Core (Comment))   Resp 16   Ht  (1.626 m)   SpO2 100%   Physical Exam  Constitutional:  Frail elderly lady  HENT:  Head: Normocephalic and atraumatic.  Mouth/Throat: Oropharynx is clear and moist.  Pulmonary/Chest:  Minimal respiratory effort, crackles bilaterally  Abdominal: Soft. She exhibits no distension and no mass.  Musculoskeletal: She exhibits no edema or deformity.  Neurological:  Does not respond to verbal stimuli. Withdraws to pain  Skin: Skin is warm and dry.  Nursing note and vitals reviewed.    ED Treatments / Results  Labs (all labs ordered are listed, but only abnormal results are displayed) Labs Reviewed  BRAIN NATRIURETIC PEPTIDE - Abnormal; Notable for the following:       Result Value   B Natriuretic Peptide 848.1 (*)    All other components within normal limits  COMPREHENSIVE METABOLIC PANEL - Abnormal; Notable for the following:    Glucose, Bld 187 (*)    BUN 23 (*)    Creatinine, Ser 1.15 (*)    Calcium 11.1 (*)    GFR calc non Af Amer 41 (*)    GFR calc Af Amer 48 (*)    All other components within normal limits  URINALYSIS, ROUTINE W REFLEX MICROSCOPIC - Abnormal; Notable for the following:    Protein, ur 30 (*)    Bacteria, UA RARE (*)    Squamous Epithelial / LPF 0-5 (*)    All other components within normal limits  APTT - Abnormal; Notable for the following:    aPTT 45 (*)    All other components within normal limits  PROTIME-INR - Abnormal; Notable for the following:    Prothrombin Time 22.6 (*)    All other components within normal limits  I-STAT CHEM 8, ED - Abnormal; Notable for the following:    BUN 29 (*)    Glucose, Bld 181 (*)    Calcium, Ion 1.47 (*)    Hemoglobin 16.0 (*)    HCT 47.0 (*)    All other components within normal  limits  I-STAT ARTERIAL BLOOD GAS, ED - Abnormal; Notable for the following:    pO2, Arterial 384.0 (*)    Bicarbonate 28.4 (*)    Acid-Base Excess 4.0 (*)    All other components within normal limits  CULTURE, BLOOD (ROUTINE X 2)  CULTURE, BLOOD (ROUTINE X 2)  URINE CULTURE  CBC WITH DIFFERENTIAL/PLATELET  TRIGLYCERIDES  PROCALCITONIN  BRAIN NATRIURETIC PEPTIDE  BASIC METABOLIC  PANEL  URINALYSIS, ROUTINE W REFLEX MICROSCOPIC  MAGNESIUM  MAGNESIUM  PHOSPHORUS  PHOSPHORUS  I-STAT TROPONIN, ED  I-STAT CG4 LACTIC ACID, ED  I-STAT CG4 LACTIC ACID, ED    EKG  EKG Interpretation  Date/Time:  Thursday March 23 2017 09:33:04 EDT Ventricular Rate:  179 PR Interval:    QRS Duration: 106 QT Interval:  255 QTC Calculation: 440 R Axis:   -25 Text Interpretation:  Atrial fibrillation with rapid V-rate LVH with secondary repolarization abnormality rate faster than previous Confirmed by Richardean Canal [phone]) on 03/26/2017 9:49:17 AM       Radiology Dg Chest Portable 1 View  Result Date: 04/01/2017 CLINICAL DATA:  Respiratory failure EXAM: PORTABLE CHEST 1 VIEW COMPARISON:  03/11/2017 FINDINGS: Endotracheal tube tip 11 mm above the carina. An orogastric tube tip reaches the stomach. Biventricular pacer leads from the left. Vascular congestion. Less well inflated left lung with generalized opacity favoring atelectasis. No Kerley lines, visible effusion, or pneumothorax. IMPRESSION: 1. Endotracheal tube tip is 11 mm above the carina. 2. An orogastric tube reaches the stomach. 3. Vascular congestion. 4. Asymmetric left lung opacification with volume loss suggesting atelectasis. Underlying pneumonia or aspiration could be present. Electronically Signed   By: Marnee Spring M.D.   On: 04/15/2017 09:59    Procedures Procedures (including critical care time)  Patient intubated  Medications Ordered in ED Medications  propofol (DIPRIVAN) 1000 MG/100ML infusion (10 mcg/kg/min  62.3 kg  Intravenous New Bag/Given 03/30/2017 1038)  0.9 %  sodium chloride infusion (not administered)  furosemide (LASIX) injection 40 mg (not administered)  albuterol (PROVENTIL) (2.5 MG/3ML) 0.083% nebulizer solution 2.5 mg (not administered)  cefTAZidime (FORTAZ) 1 g in dextrose 5 % 50 mL IVPB (not administered)  feeding supplement (VITAL HIGH PROTEIN) liquid 1,000 mL (not administered)  feeding supplement (PRO-STAT SUGAR FREE 64) liquid 30 mL (not administered)  pantoprazole (PROTONIX) injection 40 mg (not administered)  insulin aspart (novoLOG) injection 0-9 Units (not administered)  etomidate (AMIDATE) injection (20 mg Intravenous Given 03/20/2017 0929)  rocuronium (ZEMURON) injection (70 mg Intravenous Given 04/03/2017 0944)  sodium chloride 0.9 % bolus 1,869 mL (0 mL/kg  62.3 kg Intravenous Stopped 04/14/2017 1209)  ceFEPIme (MAXIPIME) 2 g in dextrose 5 % 50 mL IVPB (0 g Intravenous Stopped 03/21/2017 1208)     Initial Impression / Assessment and Plan / ED Course  I have reviewed the triage vital signs and the nursing notes.  Pertinent labs & imaging results that were available during my care of the patient were reviewed by me and considered in my medical decision making (see chart for details).     Patient unresponsive, tachycardic and hypertensive upon arrival with minimal respiratory effort. Intubated without complication. Patient intermittently in a fib. Loaded with cardizem. Initial troponin negative. CXR demonstrating possible pneumonia. Sepsis protocol initiated and patient started on cefepime and vancomycin for possible HCAP. Consult to CCM for admission.   Final Clinical Impressions(s) / ED Diagnoses   Final diagnoses:  HCAP (healthcare-associated pneumonia)    New Prescriptions New Prescriptions   No medications on file    Dolores Patty, DO PGY-2, [facility] 04/03/2017 12:38 PM    Tillman Sers, DO 04/11/2017 1238    Charlynne Pander, MD 04/03/2017 1515

## 2017-03-23 NOTE — Progress Notes (Signed)
Responded to page to support patient and family. Patient was brought to Ed after being found by daughter non responsive. Patient currently intubated and will possibly be admitted.  EDP and critical Care Doctor have both spoken with family. Daughters are coping well based on information shared.   Provided emotional support and hospitality.  Chaplain will follow as needed.   03/28/2017 1108  Clinical Encounter Type  Visited With Patient;Family;Patient and family together;Health care provider  Visit Type Initial;Spiritual support  Referral From Nurse  Spiritual Encounters  Spiritual Needs Emotional  Stress Factors  Family Stress Factors Exhausted  Fae Pippin, [facility], Pager [phone]

## 2017-03-23 NOTE — ED Notes (Signed)
B/p  Down  69/55 diprivan cut off , Temp up to 102 , paged CCM

## 2017-03-23 NOTE — Procedures (Signed)
Intubation Procedure Note [name] [id number] 07-08-1927  Procedure: Intubation Indications: Respiratory insufficiency  Procedure Details Consent: Unable to obtain consent because of emergent medical necessity. Time Out: Verified patient identification, verified procedure, site/side was marked, verified correct patient position, special equipment/implants available, medications/allergies/relevent history reviewed, required imaging and test results available.  Performed  Maximum sterile technique was used including gloves, hand hygiene and mask.  MAC and 4    Evaluation Hemodynamic Status: BP stable throughout; O2 sats: currently acceptable Patient's Current Condition: stable Complications: No apparent complications Patient did tolerate procedure well. Chest X-ray ordered to verify placement.  CXR: pending.   Dimple Nanas 04/09/2017

## 2017-03-23 NOTE — ED Notes (Signed)
Dr.Sommers notified on pt.'s hypotension and fever.

## 2017-03-23 NOTE — Progress Notes (Addendum)
eLink Physician-Brief Progress Note Patient Name: [name] DOB: [birth year] MRN: [id number]   Date of Service  2017/04/21  HPI/Events of Note  Head CT Scan without contrast - Findings suspicious for acute infarct right superior cerebellum. Recommend MRI to confirm Atrophy and chronic microvascular ischemia. However, can't do MRI d/t pacemaker insertion.   eICU Interventions  Will order: 1. OK to start ordered Heparin IV infusion if not already done.      Intervention Category Intermediate Interventions: Diagnostic test evaluation  Jani Ploeger Dennard Nip 04/21/17, 8:23 PM

## 2017-03-23 NOTE — Progress Notes (Signed)
eLink Physician-Brief Progress Note Patient Name: [name] DOB: 10/08/1926 MRN: [id number]   Date of Service  03/19/2017  HPI/Events of Note  Multiple issuse: 1. Hypotension - BP = 89/57. Propofol IV infusion now held. No CVL. LVEF = 35% to 40% and 2. Fever to 101.1 F - Already cultured and started on Fortaz. AST and ALT normal.   eICU Interventions  Will order: 1. D/C Propofol IV infusion.  2. Fentanyl IV infusion. Titrate to RASS = 0.  3. Tylenol liquid 650 mg per tube Q 6 hours PRN Temp > 100.5 F.     Intervention Category Major Interventions: Hypotension - evaluation and management;Infection - evaluation and management  Sommer,Steven Eugene 04/13/2017, 7:20 PM

## 2017-03-23 NOTE — H&P (Addendum)
PULMONARY / CRITICAL CARE MEDICINE   Name: [name] MRN: [id number] DOB: June 23, 1927    ADMISSION DATE:  2017-04-20  REFERRING MD:  EDP   CHIEF COMPLAINT:  Respiratory failure   HISTORY OF PRESENT ILLNESS:   83yo female with hx Afib on coumadin, DM, severe AS, chronic sCHF, HTN with recent admission for acute on chronic CHF just d/c 9/4.  She returned to ER 9/6 after being found unresponsive by family. She was intubated on arrival to ER and PCCM called for ICU admission.    In ER BNP 848, wbc normal, lactate normal.   Was in usual state of health yesterday and had improved after d/c 2 days ago.  Per family has been declining overall.  Lives with daughters, unable to do ADL's.   PAST MEDICAL HISTORY :  She  has a past medical history of Aortic stenosis, severe; Atrial fibrillation, chronic (HCC); Chronic systolic CHF (congestive heart failure) (HCC); Dyslipidemia; Essential hypertension; Incontinent of urine; Osteoarthritis; Thyroid goiter; and Type 2 diabetes mellitus (HCC).  PAST SURGICAL HISTORY: She  has a past surgical history that includes Pacemaker insertion; Cardiac catheterization; and Abdominal hysterectomy.  No Known Allergies  No current facility-administered medications on file prior to encounter.    Current Outpatient Prescriptions on File Prior to Encounter  Medication Sig  . alendronate (FOSAMAX) 70 MG tablet Take 70 mg by mouth once a week. Take with a full glass of water on an empty stomach.  . ARTIFICIAL TEAR SOLUTION OP Place 1 drop into both eyes as needed (dry eyes).  . calcitRIOL (ROCALTROL) 0.25 MCG capsule TAKE 1 CAPSULE (0.25 MCG TOTAL) BY MOUTH DAILY.  . cholecalciferol (VITAMIN D) 1000 units tablet Take 1,000 Units by mouth daily.  . furosemide (LASIX) 20 MG tablet Take 3 tablets (60 mg total) by mouth daily.  Marland Kitchen glimepiride (AMARYL) 1 MG tablet Take 1 mg by mouth daily with breakfast.   . lisinopril (PRINIVIL,ZESTRIL) 10 MG tablet Take 0.5 tablets (5  mg total) by mouth daily.  . metoprolol succinate (TOPROL-XL) 25 MG 24 hr tablet Take 3 tablets (75 mg total) by mouth 2 (two) times daily. Take with or immediately following a meal.  . Nutritional Supplements (VITAMIN D BOOSTER PO) Take 1,200 mg by mouth daily.  . potassium chloride SA (K-DUR,KLOR-CON) 20 MEQ tablet Take 2 tablets (40 mEq total) by mouth daily.  . QUEtiapine (SEROQUEL) 25 MG tablet Take 1 tablet (25 mg total) by mouth at bedtime.  . rosuvastatin (CRESTOR) 10 MG tablet Take 10 mg by mouth daily.  . vitamin B-12 1000 MCG tablet Take 1 tablet (1,000 mcg total) by mouth daily.  Marland Kitchen warfarin (COUMADIN) 4 MG tablet Take 2-4 mg by mouth See admin instructions. Takes 4 mg on Mon., Tues., SAT., Sun. ,Fri  Take 2 mg on Wed and thursday    FAMILY HISTORY:  Her indicated that her mother is deceased. She reported the following about her father: Unknown history.She indicated that her sister is alive. She indicated that only one of her two brothers is alive.    SOCIAL HISTORY: She  reports that she has never smoked. She quit smokeless tobacco use about 16 years ago. Her smokeless tobacco use included Snuff. She reports that she does not drink alcohol or use drugs.  REVIEW OF SYSTEMS:   As per HPI obtained from family and RN- All other systems reviewed and were neg.     SUBJECTIVE:    VITAL SIGNS: BP 133/85   Pulse  85   Temp (!) 97.3 F (36.3 C) (Core (Comment))   Resp 16   Ht  (1.626 m)   SpO2 100%   HEMODYNAMICS:    VENTILATOR SETTINGS: Vent Mode: PRVC FiO2 (%):  [40 %-100 %] 40 % Set Rate:  [12 bmp-16 bmp] 12 bmp Vt Set:  [440 mL] 440 mL PEEP:  [5 cmH20] 5 cmH20 Plateau Pressure:  [21 cmH20-25 cmH20] 25 cmH20  INTAKE / OUTPUT: No intake/output data recorded.  PHYSICAL EXAMINATION: General:  Thin, frail, elderly female NAD on vent  Neuro:  Sedated on vent, withdraws to pain, pupils pinpoint  HEENT:  Mm moist, +JVD, ETT Cardiovascular:  s1s2 irreg  Lungs:   resps even non labored on vent, coarse crackles throughout  Abdomen:  Soft, +bs  Musculoskeletal:  Warm and dry, 1+ BLE edema   LABS:  BMET  Recent Labs Lab 03/17/17 0445 03/18/17 0642 03/19/2017 0937 04/08/2017 1000  NA 141 140 139 142  K 3.5 4.1 4.8 4.7  CL 109 108 105 104  CO2 --   BUN 20 19 23* 29*  CREATININE 1.27* 1.14* 1.15* 1.00  GLUCOSE 164* 127* 187* 181*    Electrolytes  Recent Labs Lab 03/17/17 0445 03/18/17 0642 03/28/2017 0937  CALCIUM 10.3 10.6* 11.1*    CBC  Recent Labs Lab 03/20/17 0343 03/21/17 0619 04/10/2017 0937 03/22/2017 1000  WBC 5.1 4.4 4.2  --   HGB 12.1 12.2 12.9 16.0*  HCT 38.8 38.4 42.1 47.0*  PLT 192 184 208  --     Coag's  Recent Labs Lab 03/20/17 0343 03/21/17 0619 04/12/2017 0937  APTT  --   --  45*  INR 1.87 2.02 2.01    Sepsis Markers  Recent Labs Lab 04/07/2017 1000  LATICACIDVEN 1.41    ABG  Recent Labs Lab 04/11/2017 1042  PHART 7.446  PCO2ART 40.9  PO2ART 384.0*    Liver Enzymes  Recent Labs Lab 03/27/2017 0937  AST 33  ALT 28  ALKPHOS 62  BILITOT 0.5  ALBUMIN 3.5    Cardiac Enzymes No results for input(s): TROPONINI, PROBNP in the last 168 hours.  Glucose  Recent Labs Lab 03/19/17 2056 03/20/17 0729 03/20/17 1120 03/20/17 1607 03/20/17 2116 03/21/17 0801  GLUCAP 208* 105* 149* 236* 243* 126*    Imaging Dg Chest Portable 1 View  Result Date: 03/19/2017 CLINICAL DATA:  Respiratory failure EXAM: PORTABLE CHEST 1 VIEW COMPARISON:  03/11/2017 FINDINGS: Endotracheal tube tip 11 mm above the carina. An orogastric tube tip reaches the stomach. Biventricular pacer leads from the left. Vascular congestion. Less well inflated left lung with generalized opacity favoring atelectasis. No Kerley lines, visible effusion, or pneumothorax. IMPRESSION: 1. Endotracheal tube tip is 11 mm above the carina. 2. An orogastric tube reaches the stomach. 3. Vascular congestion. 4. Asymmetric left lung  opacification with volume loss suggesting atelectasis. Underlying pneumonia or aspiration could be present. Electronically Signed   By: Marnee Spring M.D.   On: 03/24/2017 09:59     STUDIES:  CT head 9/6>>>   CULTURES: U/a 9/6>>>  ANTIBIOTICS: Cefepime 9/6>>> vanc x1 9/6  SIGNIFICANT EVENTS:   LINES/TUBES: ETT 9/6>>>  DISCUSSION: 83yo female with critical AS, HTN, CHF, DM, Afib on coumadin. Just d/c 2 days prior to admission after admission for decompensated CHF.  Now with acute VDRF.  Likely r/t volume overload, CHF, AS.    ASSESSMENT / PLAN:  PULMONARY Acute respiratory failure - likely r/t volume overload.  P:  Vent support - 8cc/kg  F/u CXR  F/u ABG SBT in am  Diuresis as below   CARDIOVASCULAR Severe AS  Hx HTN  Afib on coumadin  Acute on chronic sCHF  P:  Heparin gtt per pharmacy  Diuresis as BP and Scr tolerate  Lasix 40mg  IV BID x 2 doses  This is NOT sepsis  No further lactate  Will let Dr. Graciela Husbands know about admission  DNR as below  Low dose peripheral neo ok if needed  Trend troponin  12 lead EKG Hold home coumadin, lisinopril, metoprolol  Heparin gtt per pharmacy if head CT ok   RENAL No active issue P:   F/u chem  Diuresis as above  Recheck chem this pm with diuresis   GASTROINTESTINAL No active issue  P:   NPO  PPI   HEMATOLOGIC Chronic anticoagulation  P:  F/u cbc  Heparin gtt per pharmacy if head CT ok   INFECTIOUS ?HCAP - doubt  P:   D/c vanc  Change cefepime to ceftaz with AMS and potential for aspiration but doubt this is PNA  Check pct  [place] or d/c abx quickly   ENDOCRINE DM   P:   SSI   NEUROLOGIC AMS - likely r/t respiratory failure in setting acute on chronic CHF  P:   RASS goal: -1 CT head - r/t bleed in setting coumadin    FAMILY  - Updates:  Daughters updated at length 9/6  - Inter-disciplinary family meet or Palliative Care meeting due by:  9/15  DNR per Dr. Gwendolyn Grant discussion with  family.  Continue current medical therapy including Low dose peripheral neo if needed but NO ACLS, invasive procedures.     Dirk Dress, NP 04-11-2017  11:41 AM Pager: [phone] or [phone]  STAFF NOTE: I, Rory Percy, MD FACP have personally reviewed patient's available data, including medical history, events of note, physical examination and test results as part of my evaluation. I have discussed with resident/NP and other care providers such as pharmacist, RN and RRT. In addition, I personally evaluated patient and elicited key findings of: not awake, per sluggish 44mm, JVD present, murmur sys present, crackles, abdo soft, edema 1 plus, pcxr which I reviewed shows int prominence, ett low ( will adjust out 1 cm), she presented with found unresponsive just dc from hospital for tight AS and chf, I a concerned about this same presentation from her cardiac condition AS, chf causing pulm edema and neuro changes from resp failure, certainly needs STAT head CT with inr 2 and presentation to rule out ich, their was concern from ER for sepsis and PNA, I am NOT concerned with this at this stage, no sig fever, no well defined infiltrate, no secretions, would dc sepsis protocol, dc fluids as sys 150 now and mantain ABX ceftaz for concerns aspiration, I also do not see sirs 2/4, ABG reviewed - reduce MV slight, lasix start (hope as can tolerate), have d/w cardiology to assess in am, will follow trop and pcxr and response to diuresis, of course the head CT is pending now, consider feeding early, follow sputum if able, I have had extensive discussions with family daughters x 3 speaking for family We discussed patients current circumstances ( dependent for some ADL) and current organ failures. We also discussed patient's prior wishes under circumstances such as this. Family has decided to NOT perform resuscitation if arrest but to continue current medical support for now including neo if BP drops in  future  but no acls. Cards will discuss any options for valve with family. Add ssi for dm. Heparin if INR less 2, re assess in am , pending head CT to rule out ICH. If present would reverse stat  The patient is critically ill with multiple organ systems failure and requires high complexity decision making for assessment and support, frequent evaluation and titration of therapies, application of advanced monitoring technologies and extensive interpretation of multiple databases.   Critical Care Time devoted to patient care services described in this note is 50 Minutes. This time reflects time of care of this signee: Rory Percy, MD FACP. This critical care time does not reflect procedure time, or teaching time or supervisory time of PA/NP/Med student/Med Resident etc but could involve care discussion time. Rest per NP/medical resident whose note is outlined above and that I agree with   Mcarthur Rossetti. Tyson Alias, MD, FACP Pgr: [phone] [facility] 04-21-2017 11:56 AM

## 2017-03-23 NOTE — ED Notes (Signed)
Returned from ct scan 

## 2017-03-24 ENCOUNTER — Inpatient Hospital Stay (HOSPITAL_COMMUNITY): Payer: Medicare Other

## 2017-03-24 LAB — BASIC METABOLIC PANEL
ANION GAP: 11 (ref 5–15)
BUN: 22 mg/dL — ABNORMAL HIGH (ref 6–20)
CALCIUM: 10.6 mg/dL — AB (ref 8.9–10.3)
CO2: 24 mmol/L (ref 22–32)
Chloride: 105 mmol/L (ref 101–111)
Creatinine, Ser: 1.44 mg/dL — ABNORMAL HIGH (ref 0.44–1.00)
GFR, EST AFRICAN AMERICAN: 36 mL/min — AB (ref >60)
GFR, EST NON AFRICAN AMERICAN: 31 mL/min — AB (ref >60)
Glucose, Bld: 257 mg/dL — ABNORMAL HIGH (ref 65–99)
Potassium: 5.1 mmol/L (ref 3.5–5.1)
SODIUM: 140 mmol/L (ref 135–145)

## 2017-03-24 LAB — PROTIME-INR
INR: 2.46
Prothrombin Time: 26.5 seconds — ABNORMAL HIGH (ref 11.4–15.2)

## 2017-03-24 LAB — URINE CULTURE: Culture: NO GROWTH

## 2017-03-24 LAB — CBC
HEMATOCRIT: 44.9 % (ref 36.0–46.0)
Hemoglobin: 14.4 g/dL (ref 12.0–15.0)
MCH: 30.8 pg (ref 26.0–34.0)
MCHC: 32.1 g/dL (ref 30.0–36.0)
MCV: 95.9 fL (ref 78.0–100.0)
PLATELETS: 187 10*3/uL (ref 150–400)
RBC: 4.68 MIL/uL (ref 3.87–5.11)
RDW: 15.1 % (ref 11.5–15.5)
WBC: 14.4 10*3/uL — AB (ref 4.0–10.5)

## 2017-03-24 LAB — GLUCOSE, CAPILLARY
GLUCOSE-CAPILLARY: 265 mg/dL — AB (ref 65–99)
GLUCOSE-CAPILLARY: 230 mg/dL — AB (ref 65–99)
Glucose-Capillary: 254 mg/dL — ABNORMAL HIGH (ref 65–99)
Glucose-Capillary: 224 mg/dL — ABNORMAL HIGH (ref 65–99)
Glucose-Capillary: 229 mg/dL — ABNORMAL HIGH (ref 65–99)

## 2017-03-24 LAB — HEPARIN LEVEL (UNFRACTIONATED): HEPARIN UNFRACTIONATED: 0.27 [IU]/mL — AB (ref 0.30–0.70)

## 2017-03-24 LAB — PHOSPHORUS: PHOSPHORUS: 3.3 mg/dL (ref 2.5–4.6)

## 2017-03-24 LAB — MAGNESIUM: MAGNESIUM: 2.2 mg/dL (ref 1.7–2.4)

## 2017-03-24 MED ORDER — ORAL CARE MOUTH RINSE
15.0000 mL | Freq: Four times a day (QID) | OROMUCOSAL | Status: DC
  Administered 2017-03-24 (×2): 15 mL via OROMUCOSAL

## 2017-03-24 MED ORDER — LORAZEPAM 2 MG/ML IJ SOLN: 2.0000 mg | INTRAMUSCULAR | Status: DC | PRN

## 2017-03-24 MED ORDER — CHLORHEXIDINE GLUCONATE 0.12% ORAL RINSE (MEDLINE KIT)
15.0000 mL | Freq: Two times a day (BID) | OROMUCOSAL | Status: DC
  Administered 2017-03-24 (×2): 15 mL via OROMUCOSAL

## 2017-03-24 MED ORDER — MORPHINE SULFATE 25 MG/ML IV SOLN
5.0000 mg/h | INTRAVENOUS | Status: DC
  Administered 2017-03-25: 5 mg/h via INTRAVENOUS
  Filled 2017-03-24: qty 10

## 2017-03-24 MED ORDER — VITAL HIGH PROTEIN PO LIQD: 1000.0000 mL | ORAL | Status: DC

## 2017-03-24 MED ORDER — INSULIN ASPART 100 UNIT/ML ~~LOC~~ SOLN: 4.0000 [IU] | SUBCUTANEOUS | Status: DC

## 2017-03-24 MED ORDER — MORPHINE BOLUS VIA INFUSION
2.0000 mg | INTRAVENOUS | Status: DC | PRN
  Administered 2017-03-25 (×3): 2 mg via INTRAVENOUS
  Filled 2017-03-24: qty 2

## 2017-03-24 NOTE — Progress Notes (Addendum)
ANTICOAGULATION CONSULT NOTE - Follow-Up Consult  Pharmacy Consult for heparin Indication: atrial fibrillation  No Known Allergies  Patient Measurements: Height: 5\' 4"  (162.6 cm) Weight: 137 lb 12.6 oz (62.5 kg) IBW/kg (Calculated) : 54.7 Heparin Dosing Weight: 62.3kg  Vital Signs: Temp: 98 F (36.7 C) (09/06 2356) Temp Source: Axillary (09/06 2356) BP: 85/58 (09/06 2315) Pulse Rate: 63 (09/06 2315)  Labs:  Recent Labs  03/21/17 0619 04/04/2017 0937 03/22/2017 1000 03/27/2017 2146 03/24/17 0444  HGB 12.2 12.9 16.0*  --  14.4  HCT 38.4 42.1 47.0*  --  44.9  PLT 184 208  --   --  187  APTT  --  45*  --   --   --   LABPROT 22.7* 22.6*  --   --  26.5*  INR 2.02 2.01  --   --  2.46  HEPARINUNFRC 0.33  --   --   --  0.27*  CREATININE  --  1.15* 1.00 1.31*  --     Estimated Creatinine Clearance: 25.6 mL/min (A) (by C-G formula based on SCr of 1.31 mg/dL (H)).   Medical History: Past Medical History:  Diagnosis Date  . Aortic stenosis, severe    Moderate to severe August 2015   . Atrial fibrillation, chronic (HCC)    a. on Coumadin  . Chronic systolic CHF (congestive heart failure) (HCC)   . Dyslipidemia   . Essential hypertension   . Incontinent of urine   . Osteoarthritis   . Thyroid goiter   . Type 2 diabetes mellitus (HCC)     Assessment: 32 yof presented to the ED in respiratory distress and with AMS. She is on chronic coumadin for history of afib, with last dose on 9/5. INR 2.01 on 9/6 and CCM was okay to begin heparin. Pharmacy consulted to start heparin gtt on 9/6.     INR this morning has increased to 2.46. Heparin level is slightly subtherapeutic at 0.27. Spoke with Dr. Molli Knock about INR trending up. Orders received to hold heparin.   CBC stable, no bleeding noted.   Goal of Therapy:  Heparin level 0.3-0.7 units/ml Monitor platelets by anticoagulation protocol: Yes   Plan:  Hold heparin gtt in setting of elevated INR Monitor INR and f/u with CCM re:  resuming heparin  Monitor for s/s bleeding   York Cerise, PharmD Clinical Pharmacist 03/24/17 5:50 AM

## 2017-03-24 NOTE — Progress Notes (Signed)
Made CCM aware of initial assessment of left pupil 1 and right pupil 5 nonreactive. Unresponsive at baseline. CCM to assess at bedside. MRI unavailable d/t pacemaker patient has on admission. Huntley Estelle E, RN 03/24/2017 8:55 AM

## 2017-03-24 NOTE — Progress Notes (Signed)
Nutrition Brief Note  Pt identified for positive Malnutrition Screening Tool (MST) score and new vent. Chart reviewed. Pt was found unresponsive by family on 9/6; she was intubated on arrival to the ED. Per PCCM NP note today at [phone], awaiting additional family arrival and then plan for extubation and comfort care measures.  No further nutrition interventions warranted at this time.  Please re-consult as needed.     Trenton Gammon, MS, RD, LDN, [facility] Inpatient Clinical Dietitian Pager # [phone] After hours/weekend pager # [phone]

## 2017-03-24 NOTE — Progress Notes (Signed)
PULMONARY / CRITICAL CARE MEDICINE   Name: [name] MRN: [id number] DOB: [birth year]    ADMISSION DATE:  04-19-2017  REFERRING MD:  EDP   CHIEF COMPLAINT:  Respiratory failure   HISTORY OF PRESENT ILLNESS:   83yo female with hx Afib on coumadin, DM, severe AS, chronic sCHF, HTN with recent admission for acute on chronic CHF just d/c 9/4.  She returned to ER 9/6 after being found unresponsive by family. She was intubated on arrival to ER and PCCM called for ICU admission.    In ER BNP 848, wbc normal, lactate normal.   Was in usual state of health yesterday and had improved after d/c 2 days ago.  Per family has been declining overall.  Lives with daughters, unable to do ADL's.    SUBJECTIVE:  Minimally responsive    VITAL SIGNS: BP 108/80   Pulse 95   Temp 97.8 F (36.6 C) (Oral)   Resp 15   Ht  (1.626 m)   Wt 137 lb 12.6 oz (62.5 kg)   SpO2 100%   BMI 23.65 kg/m   HEMODYNAMICS:    VENTILATOR SETTINGS: Vent Mode: PRVC FiO2 (%):  [40 %] 40 % Set Rate:  [12 bmp] 12 bmp Vt Set:  [440 mL] 440 mL PEEP:  [5 cmH20] 5 cmH20 Plateau Pressure:  [20 cmH20-25 cmH20] 20 cmH20  INTAKE / OUTPUT: I/O last 3 completed shifts: In: 427.1 [I.V.:367.1; Other:10; IV Piggyback:50] Out: 1555 [Urine:1555]  PHYSICAL EXAMINATION: General appearance:  83 Year old  female,cachectic  Not responsive Eyes: anicteric sclerae, moist conjunctivae; right pupil 5 NR, left reactive Mouth:  membranes and no mucosal ulcerations; normal hard and soft palate, orally intubated Neck: Trachea midline; neck supple, no JVD Lungs/chest: decreased bases, with normal respiratory effort and no intercostal retractions CV: regular irreg  Abdomen: Soft, non-tender; no masses or HSM Extremities: No sig peripheral edema or extremity lymphadenopathy Skin: Normal temperature, turgor and texture; no rash, ulcers or subcutaneous nodules Psych: only responds to painful stim. Non-specific localization vs  flexion of RUE and some contraction of LLE. She has left sided hemiparesis   LABS:  BMET  Recent Labs Lab Apr 19, 2017 0937 2017/04/19 1000 2017-04-19 2146 03/24/17 0444  NA 139 142 141 140  K 4.8 4.7 4.9 5.1  CL 105 104 106 105  CO2 24  --  24 24  BUN 23* 29* 20 22*  CREATININE 1.15* 1.00 1.31* 1.44*  GLUCOSE 187* 181* 239* 257*    Electrolytes  Recent Labs Lab Apr 19, 2017 0937 Apr 19, 2017 1235 04-19-17 2146 03/24/17 0444  CALCIUM 11.1*  --  10.7* 10.6*  MG  --  2.3 2.2 2.2  PHOS  --  3.2 3.4 3.3    CBC  Recent Labs Lab 03/21/17 0619 04-19-17 0937 2017-04-19 1000 03/24/17 0444  WBC 4.4 4.2  --  14.4*  HGB 12.2 12.9 16.0* 14.4  HCT 38.4 42.1 47.0* 44.9  PLT 184 208  --  187    Coag's  Recent Labs Lab 03/21/17 0619 2017-04-19 0937 03/24/17 0444  APTT  --  45*  --   INR 2.02 2.01 2.46    Sepsis Markers  Recent Labs Lab April 19, 2017 1000  LATICACIDVEN 1.41    ABG  Recent Labs Lab Apr 19, 2017 1042  PHART 7.446  PCO2ART 40.9  PO2ART 384.0*    Liver Enzymes  Recent Labs Lab 04/19/17 0937  AST 33  ALT 28  ALKPHOS 62  BILITOT 0.5  ALBUMIN 3.5    Cardiac  Enzymes No results for input(s): TROPONINI, PROBNP in the last 168 hours.  Glucose  Recent Labs Lab 04/12/2017 0926 04/02/2017 1718 04/14/2017 2112 04/15/2017 2358 03/24/17 0532 03/24/17 0830  GLUCAP 167* 229* 246* 192* 265* 254*    Imaging Ct Head Wo Contrast  Result Date: 04/14/2017 CLINICAL DATA:  Altered level of consciousness. EXAM: CT HEAD WITHOUT CONTRAST TECHNIQUE: Contiguous axial images were obtained from the base of the skull through the vertex without intravenous contrast. COMPARISON:  CT head 03/12/2017 FINDINGS: Brain: Image quality degraded by motion Ill-defined hypodensity right superior cerebellum, suspicious for acute infarct. This was not present previously. No other areas of acute infarct. Negative for hemorrhage Moderate to advanced atrophy. Chronic microvascular ischemic changes  in the white matter. No mass lesion. Vascular: Negative for hyperdense vessel Skull: Negative Sinuses/Orbits: Mucosal edema in the ethmoid sinuses. Negative orbit. Other: None IMPRESSION: Findings suspicious for acute infarct right superior cerebellum. Recommend MRI to confirm Atrophy and chronic microvascular ischemia. Electronically Signed   By: Marlan Palau M.D.   On: 03/31/2017 16:02   Dg Chest Port 1 View  Result Date: 03/24/2017 CLINICAL DATA:  Respiratory failure EXAM: PORTABLE CHEST 1 VIEW COMPARISON:  03/20/2017 FINDINGS: Endotracheal tube has been advanced and is now in the proximal right main bronchus. Recommend withdrawal 3 cm. Gastric tube in the stomach. Pacemaker unchanged. Atherosclerotic aorta. Mild left lower lobe consolidation has improved. No significant effusion. IMPRESSION: Endotracheal tube in the proximal right main bronchus, recommend withdrawal 3 cm Mild left lower lobe airspace disease has improved. These results will be called to the ordering clinician or representative by the Radiologist Assistant, and communication documented in the PACS or zVision Dashboard. Electronically Signed   By: Marlan Palau M.D.   On: 03/24/2017 07:09   Dg Chest Portable 1 View  Result Date: 03/30/2017 CLINICAL DATA:  Respiratory failure EXAM: PORTABLE CHEST 1 VIEW COMPARISON:  03/11/2017 FINDINGS: Endotracheal tube tip 11 mm above the carina. An orogastric tube tip reaches the stomach. Biventricular pacer leads from the left. Vascular congestion. Less well inflated left lung with generalized opacity favoring atelectasis. No Kerley lines, visible effusion, or pneumothorax. IMPRESSION: 1. Endotracheal tube tip is 11 mm above the carina. 2. An orogastric tube reaches the stomach. 3. Vascular congestion. 4. Asymmetric left lung opacification with volume loss suggesting atelectasis. Underlying pneumonia or aspiration could be present. Electronically Signed   By: Marnee Spring M.D.   On: 04/12/2017 09:59      STUDIES:  CT head 9/6>>> Findings suspicious for acute infarct right superior cerebellum. CT head 9/7>>> EEG 9/7>>>  CULTURES: U/a 9/6>>>  ANTIBIOTICS: Cefepime 9/6>>> vanc x1 9/6  SIGNIFICANT EVENTS:   LINES/TUBES: ETT 9/6>>>  DISCUSSION: 83yo female with critical AS, HTN, CHF, DM, Afib on coumadin. Just d/c 2 days prior to admission after admission for decompensated CHF.  Now with acute VDRF.  Likely r/t volume overload, CHF, AS.    ASSESSMENT / PLAN:   Acute Encephalopathy w/ radiographic evidence of acute infarct/CVA ->CT head: Findings suspicious for acute infarct right superior cerebellum. Plan  PAD protocol w/ Cont RASS goal 0 to -1 Will get EEG and ask neuro to see Cont supportive care Can't get MRI d/t PPM  Acute respiratory failure - likely r/t volume overload and AMS pcxr personally reviewed: bilateral patchy infiltrates. she is rotated so difficult to assess if better or worse.  Plan  Severe AS  Hx HTN  Afib on coumadin w/ h/o probable LA thrombus  Acute on chronic  sCHF  Plan Cont tele  Cont heparin gtt Trend trops Diuresis as BP and Scr tolerate (hold today)   AKI superimposed on CKD 3 Plan Hold lasix today Repeat am chem  Strict I&O   Chronic anticoagulation  Plan Trend cbc Transfuse per protocol Cont IV heparin    ?HCAP - doubt  ->WBC rising Plan Day 2 fortaz Trend PCT   DM   P:   Adjust basal insulin   FAMILY  - Updates:  Daughters updated at length 9/6  - Inter-disciplinary family meet or Palliative Care meeting due by:  9/15  DNR per Dr. Gwendolyn Grant discussion with family.  Continue current medical therapy including Low dose peripheral neo if needed but NO ACLS, invasive procedures.   My cct 40 min  Simonne Martinet ACNP-BC [facility] Pulmonary/Critical Care Pager # [phone] OR # [phone] if no answer    03/24/2017 9:39 AM

## 2017-03-24 NOTE — Progress Notes (Signed)
Wasted 200 cc fentanyl in sink with carla fulk. Huntley Estelle E, RN 03/24/2017 1:10 PM

## 2017-03-24 NOTE — Progress Notes (Signed)
Patient's vent settings appropriate for weaning and is triggering spontaneous breaths, however patient is unresponsive with minimal gag reflex when suctioned. Patient remains on full support at this time.

## 2017-03-24 NOTE — Progress Notes (Signed)
eLink Physician-Brief Progress Note Patient Name: [name] DOB: 06/26/1927 MRN: [id number]   Date of Service  03/24/2017  HPI/Events of Note  I spoke with [name] who is POA for the patient.  The family wishes for extubation and focus on comfort only.    eICU Interventions  Extubate Morphine and ativan as needed for comfort Patient is DNR, comfort measures only     Intervention Category Intermediate Interventions: Other:  Henry Russel, Demetrius Charity 03/24/2017, 11:42 PM

## 2017-03-24 NOTE — Progress Notes (Signed)
CT results received.  I reviewed imaging w/ Neurology.  This has been a devastating progression of her stroke.  Little to offer.  Discussed this w/ family.  They are awaiting other family members to arrive then we will transition to comfort and extubate  Simonne Martinet ACNP-BC [facility] Pulmonary/Critical Care Pager # [phone] OR # [phone] if no answer

## 2017-03-24 NOTE — Progress Notes (Signed)
RT note- Dr. Marlan Palau confirmed that ETT was in Rt. Bronchus, found ETT at 25cm charted at 23, ETT now secured at 22cm per Radiologist recommendation.

## 2017-03-28 LAB — CULTURE, BLOOD (ROUTINE X 2)
CULTURE: NO GROWTH
CULTURE: NO GROWTH
Special Requests: ADEQUATE

## 2017-03-29 ENCOUNTER — Telehealth: Payer: Self-pay

## 2017-03-29 NOTE — Telephone Encounter (Signed)
On 04/15/17 I received a death certificate from Dennis Bast w/ [employer]. The death certificate is for burial. The patient is a patient of Doctor Sood. The d/c will be taken to Pulmonary Unit @ [facility] this am for signature.  On 2017/04/15 I received the death certificate back from Doctor [place]. I got the death certificate ready and called Alona Bene with [employer] to let her know the d/c is ready for pickup.

## 2017-03-29 NOTE — Progress Notes (Signed)
Late entry for coding clarification: Patient with likely moderate protein calorie malnutrition

## 2017-04-03 ENCOUNTER — Ambulatory Visit: Payer: Medicare Other | Admitting: Cardiovascular Disease

## 2017-04-17 NOTE — Death Summary Note (Signed)
[name] was a 83 y.o. female admitted on 04/04/17 with altered mental status.  She was intubated for airway protection, but DNR otherwise.  CT head showed acute right superior cerebellum infarct.  Follow up CT head showed cerebellar herniation.  Family opted for comfort measures.  She expired on 03/25/17.  Final diagnoses: Acute Rt cerebellum infarct with cerebellar edema with herniation Rt pons, midbrain, and thalamus infarcts Rt PCA infarct Acute hypoxic respiratory failure Hx of a fib on coumadin Severe aortic stenosis Acute on chronic systolic CHF Diabetes mellitus type II CKD 3  Coralyn Helling, MD [facility] Pulmonary/Critical Care 03/31/2017, 9:48 AM

## 2017-04-17 NOTE — Procedures (Signed)
Extubation Procedure Note  Patient Details:   Name: [name] DOB: February 04, 1927 MRN: [id number]   Airway Documentation:     Evaluation  O2 sats: not monitored. Extubating to comfort care.  Complications: No apparent complications Patient did tolerate procedure well. Bilateral Breath Sounds: Clear, Diminished   No  Azucena Freed 04/04/2017, 1:04 AM   Patient extubated to room air, once comfortable on Morphine drip. RN at bedside for extubation.

## 2017-04-17 DEATH — deceased

## 2017-04-20 ENCOUNTER — Telehealth: Payer: Self-pay | Admitting: Internal Medicine

## 2017-04-20 NOTE — Telephone Encounter (Signed)
New message     Daughter is calling about amending the death certificate, saying that it patient had heart attack , instead of a stroke for her insurance policy to pay her medical bills

## 2017-04-20 NOTE — Telephone Encounter (Signed)
Attempted to call the patient's daughter (DPR). I see no indication of heart attack on last admission, but death summary note by Dr. Craige Cotta as follows:  [name] was a 83 y.o. female admitted on 03/20/2017 with altered mental status.  She was intubated for airway protection, but DNR otherwise.  CT head showed acute right superior cerebellum infarct.  Follow up CT head showed cerebellar herniation.  Family opted for comfort measures.  She expired on 2017/04/23.  Final diagnoses: Acute Rt cerebellum infarct with cerebellar edema with herniation Rt pons, midbrain, and thalamus infarcts Rt PCA infarct Acute hypoxic respiratory failure Hx of a fib on coumadin Severe aortic stenosis Acute on chronic systolic CHF Diabetes mellitus type II CKD 3  Coralyn Helling, MD [facility] Pulmonary/Critical Care 04-23-2017, 9:48 AM   No answer/ no voice mail- will call back.

## 2017-04-27 NOTE — Telephone Encounter (Signed)
Attempted to reach - no answer & no voicemail

## 2017-05-19 ENCOUNTER — Ambulatory Visit: Payer: Medicare Other | Admitting: Podiatry

## 2017-05-31 ENCOUNTER — Ambulatory Visit: Payer: Medicare Other | Admitting: *Deleted

## 2018-05-26 IMAGING — CT CT HEAD W/O CM
4 series · 15 of 47 positions shown, 17 images · non-contrast
Comparison: 03/23/2017

CLINICAL DATA: Anisocoria.  Followup strokes.

EXAM:
CT HEAD WITHOUT CONTRAST
TECHNIQUE: Contiguous axial images were obtained from the base of the skull
through the vertex without intravenous contrast.

[Series 3: head without · axial · non-contrast · 0.41mm/px · z∈[-144,-24]mm · 7 of 33 slices shown, 9 images]
[im 5/33  brain]
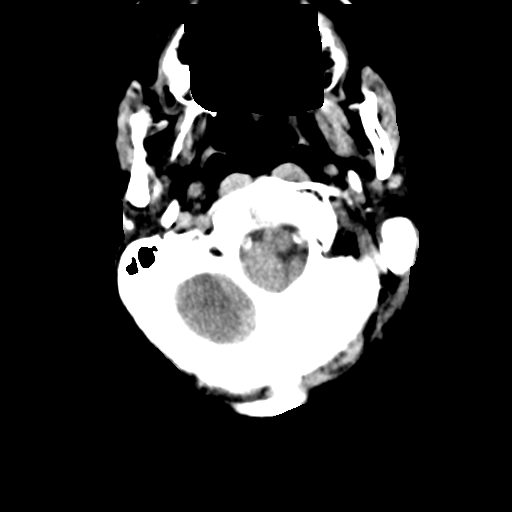
[im 5/33  bone]
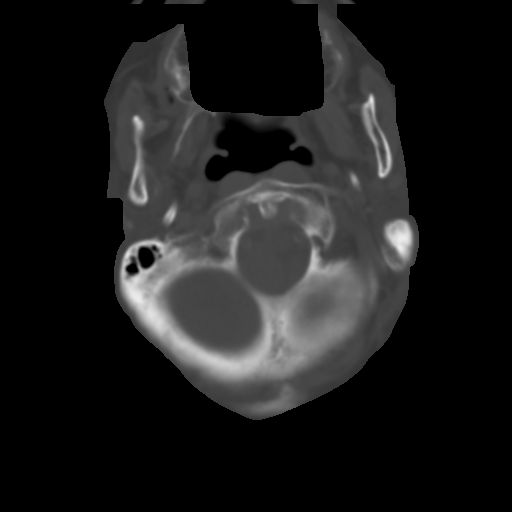
[im 9/33  brain]
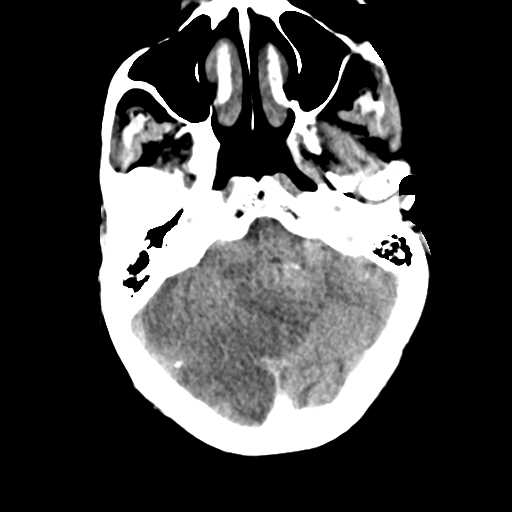
[im 13/33  brain]
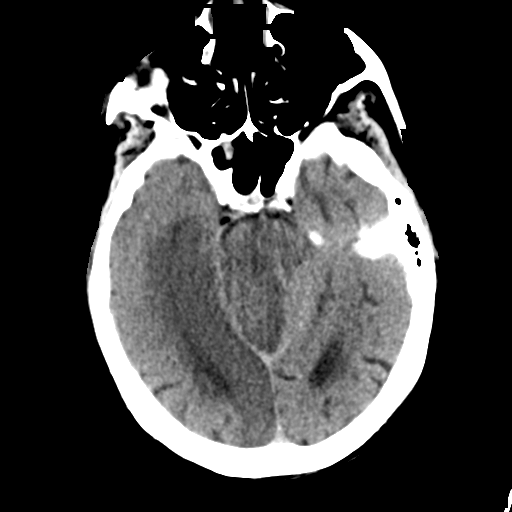
[im 17/33  brain]
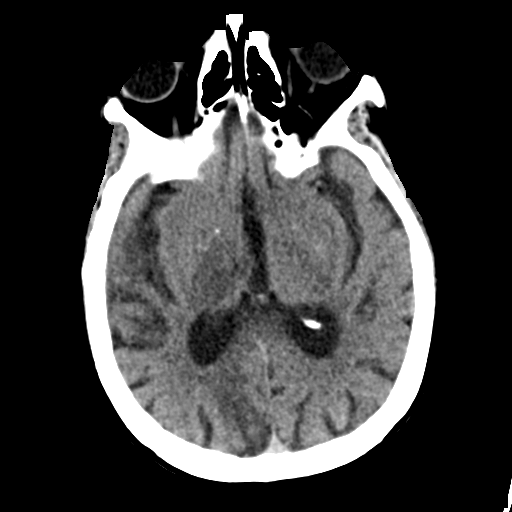
[im 21/33  brain]
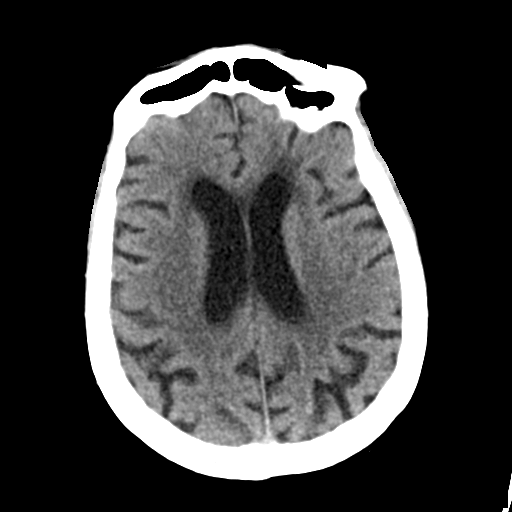
[im 21/33  bone]
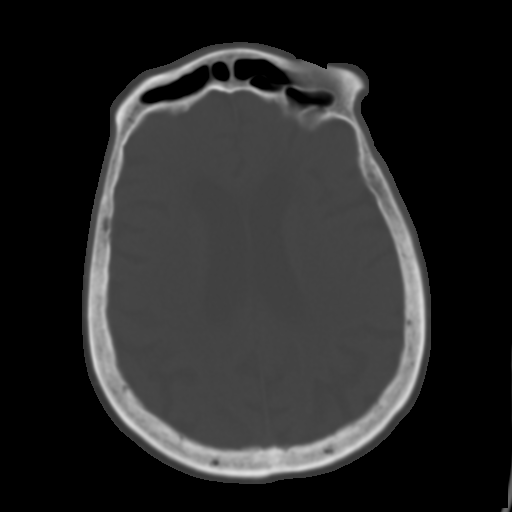
[im 25/33  brain]
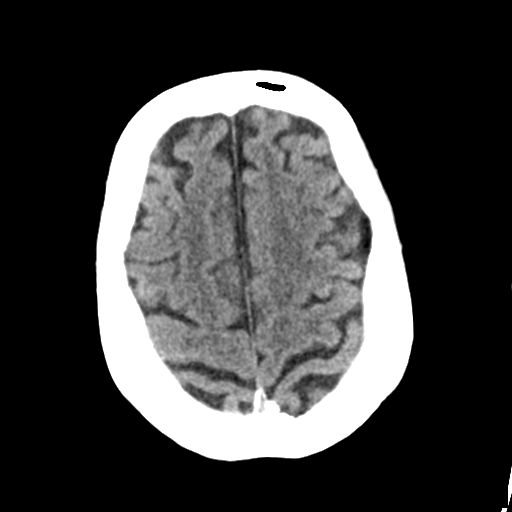
[im 29/33  brain]
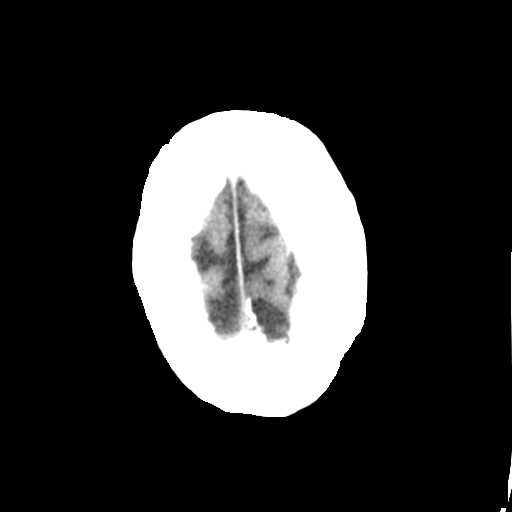

[Series 4: head bone · axial · 0.41mm/px · z∈[-148,-132]mm · 2 of 82 slices shown]
[im 9/82  bone]
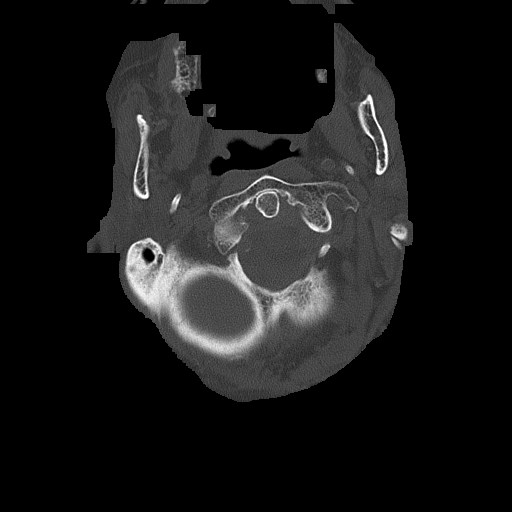
[im 17/82  bone]
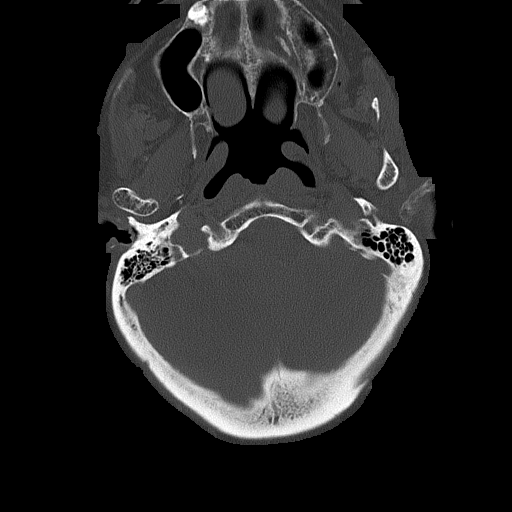

[Series 5: head without cor · coronal · non-contrast · 0.30mm/px · 3 of 72 slices shown]
[im 24/72  brain]
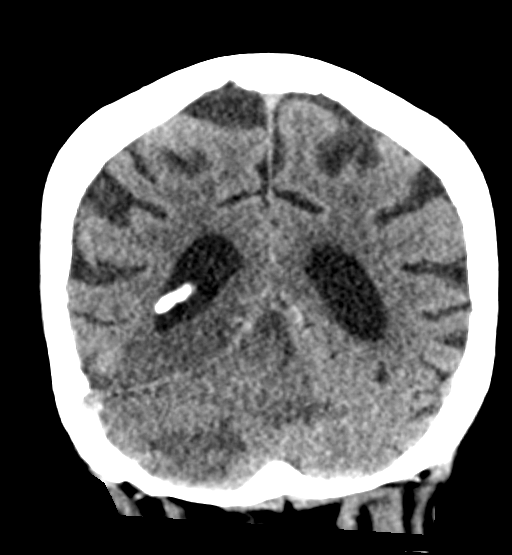
[im 32/72  brain]
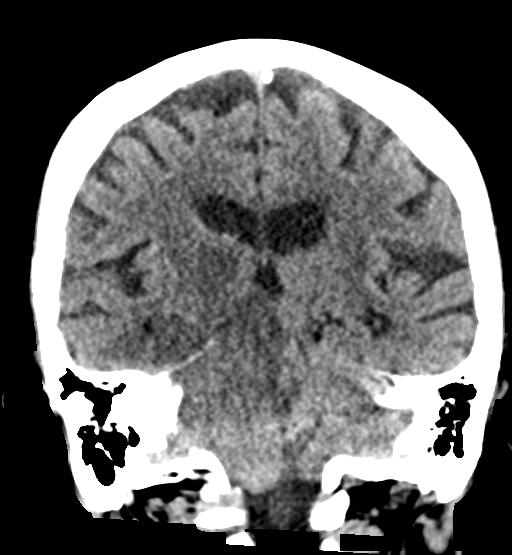
[im 40/72  brain]
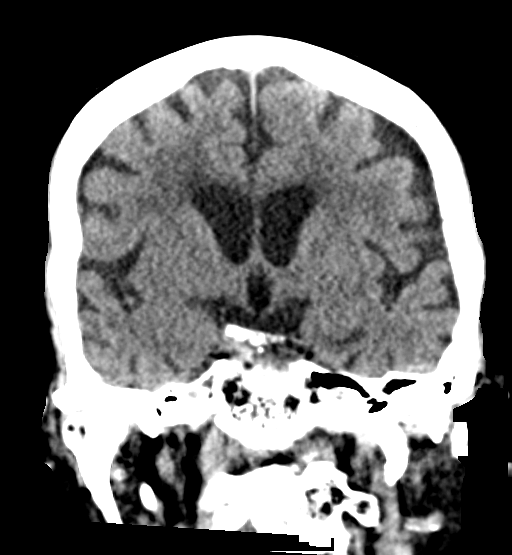

[Series 6: head without sag · sagittal · non-contrast · 0.33mm/px · 3 of 51 slices shown]
[im 17/51  brain]
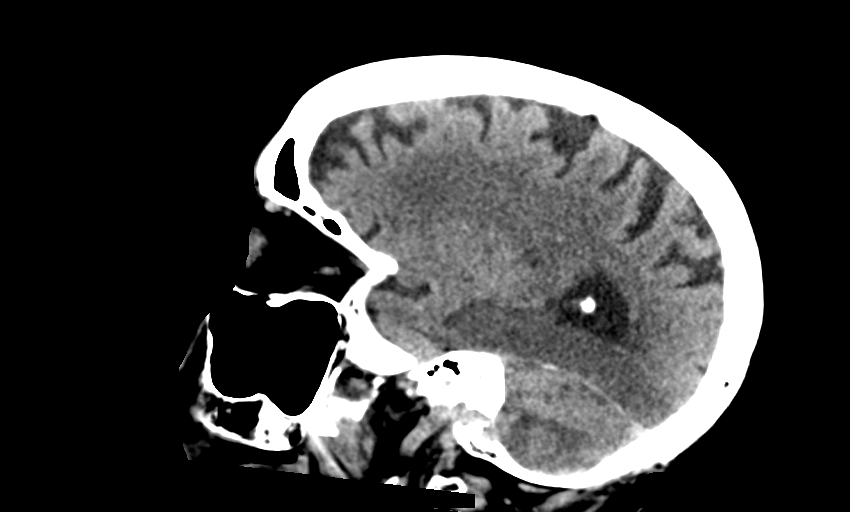
[im 26/51  brain]
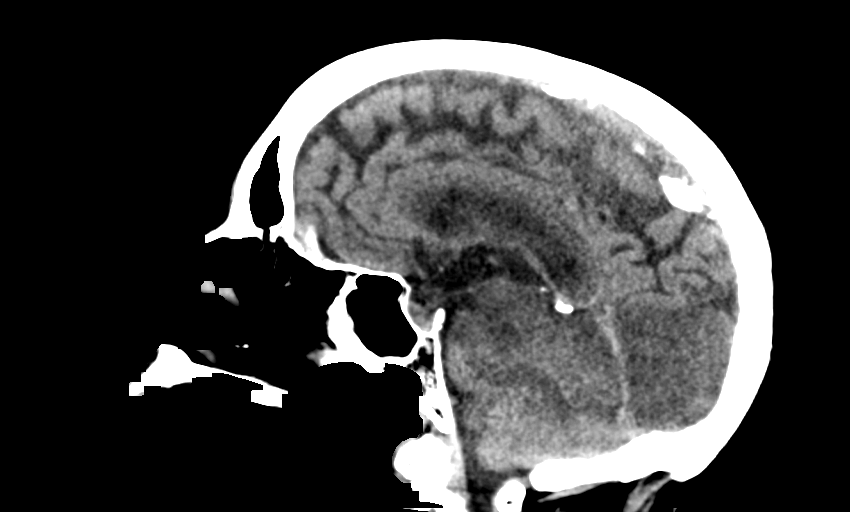
[im 34/51  brain]
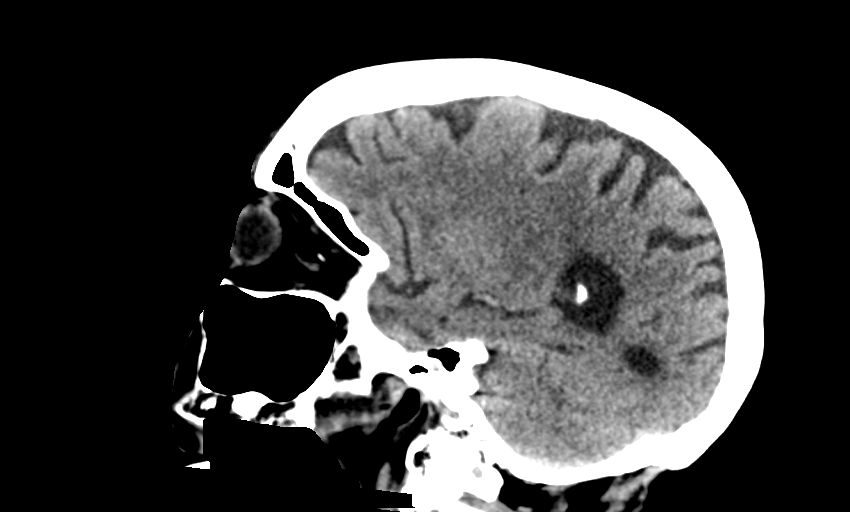

[15 of 47 positions shown; findings below may reference images not displayed]

FINDINGS: Brain: Worsening of finding since yesterday. Acute infarction within
the right cerebellum is associated with more swelling and mass
effect upon the fourth ventricle with developing cerebellar
herniation. Low-density in the right pons and midbrain with
extension into the thalamus consistent with acute infarction
affecting no structures. Low-density within the posteromedial
temporal lobe and occipital lobe consistent with right PCA
infarction. No hemorrhage. Ventricular size is stable at this time,
though there is risk of developing hydrocephalus. Cerebral
hemispheres elsewhere show chronic small-vessel ischemic changes. No
extra-axial fluid collection.

Vascular: There is atherosclerotic calcification of the major
vessels at the base of the brain.

Skull: Negative

Sinuses/Orbits: Chronic inflammatory changes of the right division
of the sphenoid sinus. Other sinuses clear. Orbits negative.

Other: None
IMPRESSION: Acute infarction of the right cerebellum with increased swelling in
mass effect upon the fourth ventricle and cerebellar herniation.

Worsening low density in the right side of the pons, midbrain and
right thalamus consistent with infarction in that region.

Worsened low-density in the right PCA territory consistent with
acute infarction.

No hemorrhage.

No change in ventricular size at this time, though the patient would
be at risk of hydrocephalus.

I am in the process of calling this report to the clinical service.
